# Patient Record
Sex: Female | Born: 1975 | Race: Black or African American | Hispanic: No | Smoking: Former smoker
Health system: Southern US, Community
[De-identification: ages and names within clinical notes are randomized; demographics above are authoritative.]

## PROBLEM LIST (undated history)

## (undated) ENCOUNTER — Inpatient Hospital Stay (HOSPITAL_COMMUNITY): Payer: Self-pay

## (undated) DIAGNOSIS — D649 Anemia, unspecified: Secondary | ICD-10-CM

## (undated) DIAGNOSIS — R7303 Prediabetes: Secondary | ICD-10-CM

## (undated) DIAGNOSIS — B1910 Unspecified viral hepatitis B without hepatic coma: Secondary | ICD-10-CM

## (undated) DIAGNOSIS — I10 Essential (primary) hypertension: Secondary | ICD-10-CM

## (undated) DIAGNOSIS — M7989 Other specified soft tissue disorders: Secondary | ICD-10-CM

## (undated) DIAGNOSIS — J45909 Unspecified asthma, uncomplicated: Secondary | ICD-10-CM

## (undated) DIAGNOSIS — L509 Urticaria, unspecified: Secondary | ICD-10-CM

## (undated) DIAGNOSIS — N979 Female infertility, unspecified: Secondary | ICD-10-CM

## (undated) DIAGNOSIS — T7840XA Allergy, unspecified, initial encounter: Secondary | ICD-10-CM

## (undated) DIAGNOSIS — E739 Lactose intolerance, unspecified: Secondary | ICD-10-CM

## (undated) DIAGNOSIS — R0602 Shortness of breath: Secondary | ICD-10-CM

## (undated) DIAGNOSIS — E669 Obesity, unspecified: Secondary | ICD-10-CM

## (undated) DIAGNOSIS — E7800 Pure hypercholesterolemia, unspecified: Secondary | ICD-10-CM

## (undated) DIAGNOSIS — J302 Other seasonal allergic rhinitis: Secondary | ICD-10-CM

## (undated) HISTORY — DX: Pure hypercholesterolemia, unspecified: E78.00

## (undated) HISTORY — DX: Prediabetes: R73.03

## (undated) HISTORY — DX: Female infertility, unspecified: N97.9

## (undated) HISTORY — DX: Other specified soft tissue disorders: M79.89

## (undated) HISTORY — PX: HIP SURGERY: SHX245

## (undated) HISTORY — DX: Urticaria, unspecified: L50.9

## (undated) HISTORY — DX: Unspecified asthma, uncomplicated: J45.909

## (undated) HISTORY — DX: Unspecified viral hepatitis B without hepatic coma: B19.10

## (undated) HISTORY — DX: Shortness of breath: R06.02

## (undated) HISTORY — DX: Lactose intolerance, unspecified: E73.9

## (undated) HISTORY — DX: Anemia, unspecified: D64.9

---

## 1998-06-10 ENCOUNTER — Emergency Department (HOSPITAL_COMMUNITY): Admission: EM | Admit: 1998-06-10 | Discharge: 1998-06-10 | Payer: Self-pay | Admitting: Emergency Medicine

## 1999-11-14 ENCOUNTER — Other Ambulatory Visit: Admission: RE | Admit: 1999-11-14 | Discharge: 1999-11-14 | Payer: Self-pay | Admitting: Obstetrics and Gynecology

## 2004-06-11 ENCOUNTER — Ambulatory Visit: Payer: Self-pay | Admitting: Family Medicine

## 2004-06-27 ENCOUNTER — Ambulatory Visit: Payer: Self-pay | Admitting: Family Medicine

## 2004-07-04 ENCOUNTER — Ambulatory Visit: Payer: Self-pay | Admitting: *Deleted

## 2005-05-06 ENCOUNTER — Ambulatory Visit: Payer: Self-pay | Admitting: Family Medicine

## 2005-06-24 ENCOUNTER — Ambulatory Visit: Payer: Self-pay | Admitting: Family Medicine

## 2005-07-08 ENCOUNTER — Ambulatory Visit: Payer: Self-pay | Admitting: Family Medicine

## 2005-08-26 ENCOUNTER — Ambulatory Visit: Payer: Self-pay | Admitting: Family Medicine

## 2006-02-06 ENCOUNTER — Ambulatory Visit: Payer: Self-pay | Admitting: *Deleted

## 2006-04-06 ENCOUNTER — Ambulatory Visit: Payer: Self-pay | Admitting: Family Medicine

## 2006-10-07 ENCOUNTER — Ambulatory Visit: Payer: Self-pay | Admitting: Family Medicine

## 2006-11-04 ENCOUNTER — Ambulatory Visit: Payer: Self-pay | Admitting: Family Medicine

## 2006-11-25 ENCOUNTER — Ambulatory Visit: Payer: Self-pay | Admitting: Internal Medicine

## 2006-11-25 ENCOUNTER — Encounter (INDEPENDENT_AMBULATORY_CARE_PROVIDER_SITE_OTHER): Payer: Self-pay | Admitting: Family Medicine

## 2007-03-13 ENCOUNTER — Emergency Department (HOSPITAL_COMMUNITY): Admission: EM | Admit: 2007-03-13 | Discharge: 2007-03-13 | Payer: Self-pay | Admitting: Emergency Medicine

## 2007-04-01 ENCOUNTER — Emergency Department (HOSPITAL_COMMUNITY): Admission: EM | Admit: 2007-04-01 | Discharge: 2007-04-01 | Payer: Self-pay | Admitting: Emergency Medicine

## 2007-04-28 ENCOUNTER — Encounter (INDEPENDENT_AMBULATORY_CARE_PROVIDER_SITE_OTHER): Payer: Self-pay | Admitting: Family Medicine

## 2007-04-28 DIAGNOSIS — D509 Iron deficiency anemia, unspecified: Secondary | ICD-10-CM

## 2007-04-28 DIAGNOSIS — B977 Papillomavirus as the cause of diseases classified elsewhere: Secondary | ICD-10-CM

## 2007-04-28 DIAGNOSIS — R87619 Unspecified abnormal cytological findings in specimens from cervix uteri: Secondary | ICD-10-CM

## 2007-05-12 ENCOUNTER — Encounter (INDEPENDENT_AMBULATORY_CARE_PROVIDER_SITE_OTHER): Payer: Self-pay | Admitting: *Deleted

## 2007-05-31 DIAGNOSIS — J309 Allergic rhinitis, unspecified: Secondary | ICD-10-CM | POA: Insufficient documentation

## 2007-08-31 ENCOUNTER — Ambulatory Visit: Payer: Self-pay | Admitting: Family Medicine

## 2007-08-31 LAB — CONVERTED CEMR LAB
ALT: 15 units/L (ref 0–35)
AST: 20 units/L (ref 0–37)
Albumin: 3.9 g/dL (ref 3.5–5.2)
Alkaline Phosphatase: 89 units/L (ref 39–117)
BUN: 12 mg/dL (ref 6–23)
Basophils Absolute: 0 10*3/uL (ref 0.0–0.1)
Basophils Relative: 1 % (ref 0–1)
CO2: 22 meq/L (ref 19–32)
Calcium: 9.3 mg/dL (ref 8.4–10.5)
Chloride: 108 meq/L (ref 96–112)
Cholesterol: 168 mg/dL (ref 0–200)
Creatinine, Ser: 0.85 mg/dL (ref 0.40–1.20)
Eosinophils Absolute: 0.2 10*3/uL (ref 0.0–0.7)
Eosinophils Relative: 4 % (ref 0–5)
Glucose, Bld: 87 mg/dL (ref 70–99)
HCT: 32.2 % — ABNORMAL LOW (ref 36.0–46.0)
HDL: 40 mg/dL (ref >39)
Hemoglobin: 8.8 g/dL — ABNORMAL LOW (ref 12.0–15.0)
LDL Cholesterol: 106 mg/dL — ABNORMAL HIGH (ref 0–99)
Lymphocytes Relative: 40 % (ref 12–46)
Lymphs Abs: 2.5 10*3/uL (ref 0.7–4.0)
MCHC: 27.3 g/dL — ABNORMAL LOW (ref 30.0–36.0)
MCV: 71.4 fL — ABNORMAL LOW (ref 78.0–100.0)
Monocytes Absolute: 0.7 10*3/uL (ref 0.1–1.0)
Monocytes Relative: 12 % (ref 3–12)
Neutro Abs: 2.6 10*3/uL (ref 1.7–7.7)
Neutrophils Relative %: 43 % (ref 43–77)
Platelets: 379 10*3/uL (ref 150–400)
Potassium: 4.2 meq/L (ref 3.5–5.3)
RBC: 4.51 M/uL (ref 3.87–5.11)
RDW: 17.5 % — ABNORMAL HIGH (ref 11.5–15.5)
Sodium: 142 meq/L (ref 135–145)
Total Bilirubin: 0.6 mg/dL (ref 0.3–1.2)
Total CHOL/HDL Ratio: 4.2
Total Protein: 7.4 g/dL (ref 6.0–8.3)
Triglycerides: 109 mg/dL (ref <150)
VLDL: 22 mg/dL (ref 0–40)
WBC: 6.1 10*3/uL (ref 4.0–10.5)

## 2008-04-04 ENCOUNTER — Ambulatory Visit: Payer: Self-pay | Admitting: Internal Medicine

## 2008-04-04 ENCOUNTER — Ambulatory Visit (HOSPITAL_COMMUNITY): Admission: RE | Admit: 2008-04-04 | Discharge: 2008-04-04 | Payer: Self-pay | Admitting: Family Medicine

## 2008-04-04 ENCOUNTER — Encounter (INDEPENDENT_AMBULATORY_CARE_PROVIDER_SITE_OTHER): Payer: Self-pay | Admitting: Family Medicine

## 2008-04-04 LAB — CONVERTED CEMR LAB
Basophils Absolute: 0 10*3/uL (ref 0.0–0.1)
Eosinophils Absolute: 0.2 10*3/uL (ref 0.0–0.7)
Eosinophils Relative: 5 % (ref 0–5)
Hemoglobin: 12.2 g/dL (ref 12.0–15.0)
Lymphocytes Relative: 47 % — ABNORMAL HIGH (ref 12–46)
MCHC: 31.4 g/dL (ref 30.0–36.0)
Monocytes Absolute: 0.3 10*3/uL (ref 0.1–1.0)
Monocytes Relative: 9 % (ref 3–12)
Neutrophils Relative %: 38 % — ABNORMAL LOW (ref 43–77)
RDW: 15.4 % (ref 11.5–15.5)
TSH: 1.268 microintl units/mL (ref 0.350–4.50)
WBC: 3.8 10*3/uL — ABNORMAL LOW (ref 4.0–10.5)

## 2009-01-12 ENCOUNTER — Ambulatory Visit: Payer: Self-pay | Admitting: Internal Medicine

## 2009-01-16 ENCOUNTER — Ambulatory Visit: Payer: Self-pay | Admitting: Internal Medicine

## 2009-02-13 ENCOUNTER — Ambulatory Visit: Payer: Self-pay | Admitting: Internal Medicine

## 2009-03-12 ENCOUNTER — Ambulatory Visit: Payer: Self-pay | Admitting: Internal Medicine

## 2009-03-26 ENCOUNTER — Encounter (INDEPENDENT_AMBULATORY_CARE_PROVIDER_SITE_OTHER): Payer: Self-pay | Admitting: Adult Health

## 2009-03-26 ENCOUNTER — Ambulatory Visit: Payer: Self-pay | Admitting: Internal Medicine

## 2009-05-07 ENCOUNTER — Encounter (INDEPENDENT_AMBULATORY_CARE_PROVIDER_SITE_OTHER): Payer: Self-pay | Admitting: Adult Health

## 2009-05-07 ENCOUNTER — Ambulatory Visit: Payer: Self-pay | Admitting: Internal Medicine

## 2009-05-07 LAB — CONVERTED CEMR LAB
ALT: 17 units/L (ref 0–35)
AST: 18 units/L (ref 0–37)
Albumin: 4 g/dL (ref 3.5–5.2)
Chlamydia, DNA Probe: NEGATIVE
Cholesterol: 136 mg/dL (ref 0–200)
Eosinophils Absolute: 0.2 10*3/uL (ref 0.0–0.7)
Eosinophils Relative: 6 % — ABNORMAL HIGH (ref 0–5)
Hemoglobin: 12.7 g/dL (ref 12.0–15.0)
LDL Cholesterol: 86 mg/dL (ref 0–99)
Lymphocytes Relative: 50 % — ABNORMAL HIGH (ref 12–46)
MCHC: 32.2 g/dL (ref 30.0–36.0)
MCV: 86.2 fL (ref 78.0–100.0)
Monocytes Absolute: 0.3 10*3/uL (ref 0.1–1.0)
Monocytes Relative: 9 % (ref 3–12)
Neutro Abs: 1 10*3/uL — ABNORMAL LOW (ref 1.7–7.7)
Potassium: 3.7 meq/L (ref 3.5–5.3)
Sodium: 141 meq/L (ref 135–145)
Total CHOL/HDL Ratio: 3.8

## 2009-05-23 ENCOUNTER — Ambulatory Visit: Payer: Self-pay | Admitting: Internal Medicine

## 2009-05-23 ENCOUNTER — Encounter (INDEPENDENT_AMBULATORY_CARE_PROVIDER_SITE_OTHER): Payer: Self-pay | Admitting: Adult Health

## 2009-05-23 LAB — CONVERTED CEMR LAB
HCT: 38.5 % (ref 36.0–46.0)
Lymphocytes Relative: 45 % (ref 12–46)
Lymphs Abs: 2.3 10*3/uL (ref 0.7–4.0)
MCHC: 31.7 g/dL (ref 30.0–36.0)
Monocytes Relative: 8 % (ref 3–12)
Neutro Abs: 2.2 10*3/uL (ref 1.7–7.7)
Neutrophils Relative %: 42 % — ABNORMAL LOW (ref 43–77)

## 2009-07-04 ENCOUNTER — Ambulatory Visit (HOSPITAL_COMMUNITY): Admission: RE | Admit: 2009-07-04 | Discharge: 2009-07-04 | Payer: Self-pay | Admitting: Obstetrics & Gynecology

## 2009-07-04 LAB — OB RESULTS CONSOLE VARICELLA ZOSTER ANTIBODY, IGG: Varicella: IMMUNE

## 2009-07-04 LAB — SICKLE CELL SCREEN: Sickle Cell Screen: NORMAL

## 2009-07-12 ENCOUNTER — Ambulatory Visit: Payer: Self-pay | Admitting: Obstetrics & Gynecology

## 2009-08-09 ENCOUNTER — Ambulatory Visit: Payer: Self-pay | Admitting: Obstetrics & Gynecology

## 2009-08-13 ENCOUNTER — Ambulatory Visit (HOSPITAL_COMMUNITY): Admission: RE | Admit: 2009-08-13 | Discharge: 2009-08-13 | Payer: Self-pay | Admitting: Family Medicine

## 2009-08-30 ENCOUNTER — Ambulatory Visit: Payer: Self-pay | Admitting: Obstetrics & Gynecology

## 2009-09-20 ENCOUNTER — Ambulatory Visit: Payer: Self-pay | Admitting: Obstetrics & Gynecology

## 2009-10-11 ENCOUNTER — Ambulatory Visit: Payer: Self-pay | Admitting: Obstetrics and Gynecology

## 2009-10-11 ENCOUNTER — Encounter: Payer: Self-pay | Admitting: Family Medicine

## 2009-10-11 LAB — CONVERTED CEMR LAB
BUN: 8 mg/dL (ref 6–23)
CO2: 16 meq/L — ABNORMAL LOW (ref 19–32)
Calcium: 9 mg/dL (ref 8.4–10.5)
Chloride: 103 meq/L (ref 96–112)
HCT: 35.6 % — ABNORMAL LOW (ref 36.0–46.0)
MCHC: 32.9 g/dL (ref 30.0–36.0)
Platelets: 246 10*3/uL (ref 150–400)
Potassium: 3.7 meq/L (ref 3.5–5.3)
RBC: 4.04 M/uL (ref 3.87–5.11)
RDW: 13.6 % (ref 11.5–15.5)
Sodium: 133 meq/L — ABNORMAL LOW (ref 135–145)
Total Bilirubin: 0.5 mg/dL (ref 0.3–1.2)

## 2009-10-12 ENCOUNTER — Ambulatory Visit: Payer: Self-pay | Admitting: Obstetrics & Gynecology

## 2009-10-12 ENCOUNTER — Encounter: Payer: Self-pay | Admitting: Family Medicine

## 2009-10-12 LAB — CONVERTED CEMR LAB: Collection Interval-CRCL: 24 hr

## 2009-10-15 ENCOUNTER — Ambulatory Visit (HOSPITAL_COMMUNITY): Admission: RE | Admit: 2009-10-15 | Discharge: 2009-10-15 | Payer: Self-pay | Admitting: Family Medicine

## 2009-10-25 ENCOUNTER — Ambulatory Visit: Payer: Self-pay | Admitting: Obstetrics & Gynecology

## 2009-11-08 ENCOUNTER — Ambulatory Visit: Payer: Self-pay | Admitting: Obstetrics & Gynecology

## 2009-11-22 ENCOUNTER — Ambulatory Visit: Payer: Self-pay | Admitting: Family Medicine

## 2009-11-23 ENCOUNTER — Ambulatory Visit (HOSPITAL_COMMUNITY): Admission: RE | Admit: 2009-11-23 | Discharge: 2009-11-23 | Payer: Self-pay | Admitting: Obstetrics & Gynecology

## 2009-11-26 ENCOUNTER — Ambulatory Visit: Payer: Self-pay | Admitting: Obstetrics & Gynecology

## 2009-11-29 ENCOUNTER — Ambulatory Visit: Payer: Self-pay | Admitting: Obstetrics & Gynecology

## 2009-12-03 ENCOUNTER — Ambulatory Visit: Payer: Self-pay | Admitting: Obstetrics and Gynecology

## 2009-12-06 ENCOUNTER — Ambulatory Visit: Payer: Self-pay | Admitting: Obstetrics & Gynecology

## 2009-12-10 ENCOUNTER — Ambulatory Visit (HOSPITAL_COMMUNITY): Admission: RE | Admit: 2009-12-10 | Discharge: 2009-12-10 | Payer: Self-pay | Admitting: Obstetrics and Gynecology

## 2009-12-10 ENCOUNTER — Ambulatory Visit: Payer: Self-pay | Admitting: Obstetrics & Gynecology

## 2009-12-13 ENCOUNTER — Ambulatory Visit: Payer: Self-pay | Admitting: Obstetrics & Gynecology

## 2009-12-14 ENCOUNTER — Encounter: Payer: Self-pay | Admitting: Obstetrics & Gynecology

## 2009-12-14 LAB — CONVERTED CEMR LAB: Chlamydia, DNA Probe: NEGATIVE

## 2009-12-17 ENCOUNTER — Ambulatory Visit (HOSPITAL_COMMUNITY): Admission: RE | Admit: 2009-12-17 | Discharge: 2009-12-17 | Payer: Self-pay | Admitting: Family Medicine

## 2009-12-17 ENCOUNTER — Ambulatory Visit: Payer: Self-pay | Admitting: Obstetrics and Gynecology

## 2009-12-20 ENCOUNTER — Ambulatory Visit: Payer: Self-pay | Admitting: Obstetrics & Gynecology

## 2009-12-20 ENCOUNTER — Ambulatory Visit (HOSPITAL_COMMUNITY): Admission: RE | Admit: 2009-12-20 | Discharge: 2009-12-20 | Payer: Self-pay | Admitting: Obstetrics & Gynecology

## 2009-12-24 ENCOUNTER — Ambulatory Visit: Payer: Self-pay | Admitting: Obstetrics & Gynecology

## 2009-12-24 ENCOUNTER — Ambulatory Visit (HOSPITAL_COMMUNITY): Admission: RE | Admit: 2009-12-24 | Discharge: 2009-12-24 | Payer: Self-pay | Admitting: Family Medicine

## 2009-12-27 ENCOUNTER — Ambulatory Visit: Payer: Self-pay | Admitting: Obstetrics and Gynecology

## 2009-12-31 ENCOUNTER — Ambulatory Visit: Payer: Self-pay | Admitting: Obstetrics & Gynecology

## 2009-12-31 ENCOUNTER — Ambulatory Visit (HOSPITAL_COMMUNITY): Admission: RE | Admit: 2009-12-31 | Discharge: 2009-12-31 | Payer: Self-pay | Admitting: Family Medicine

## 2010-01-03 ENCOUNTER — Ambulatory Visit: Payer: Self-pay | Admitting: Family Medicine

## 2010-01-04 ENCOUNTER — Inpatient Hospital Stay (HOSPITAL_COMMUNITY): Admission: AD | Admit: 2010-01-04 | Discharge: 2010-01-08 | Payer: Self-pay | Admitting: Family Medicine

## 2010-01-04 ENCOUNTER — Ambulatory Visit: Payer: Self-pay | Admitting: Obstetrics and Gynecology

## 2010-01-05 ENCOUNTER — Encounter: Payer: Self-pay | Admitting: Obstetrics & Gynecology

## 2010-01-15 ENCOUNTER — Emergency Department (HOSPITAL_COMMUNITY): Admission: EM | Admit: 2010-01-15 | Discharge: 2010-01-15 | Payer: Self-pay | Admitting: Emergency Medicine

## 2010-09-15 ENCOUNTER — Encounter: Payer: Self-pay | Admitting: *Deleted

## 2010-11-10 LAB — POCT URINALYSIS DIP (DEVICE)
Ketones, ur: NEGATIVE mg/dL
Protein, ur: NEGATIVE mg/dL
Specific Gravity, Urine: 1.02 (ref 1.005–1.030)
Urobilinogen, UA: 0.2 mg/dL (ref 0.0–1.0)
pH: 6 (ref 5.0–8.0)

## 2010-11-11 LAB — CBC
HCT: 27.3 % — ABNORMAL LOW (ref 36.0–46.0)
HCT: 28.7 % — ABNORMAL LOW (ref 36.0–46.0)
Hemoglobin: 9.4 g/dL — ABNORMAL LOW (ref 12.0–15.0)
Hemoglobin: 9.7 g/dL — ABNORMAL LOW (ref 12.0–15.0)
MCV: 89.9 fL (ref 78.0–100.0)
MCV: 89.9 fL (ref 78.0–100.0)
Platelets: 320 10*3/uL (ref 150–400)
RBC: 3.05 MIL/uL — ABNORMAL LOW (ref 3.87–5.11)
RBC: 3.07 MIL/uL — ABNORMAL LOW (ref 3.87–5.11)
RBC: 3.2 MIL/uL — ABNORMAL LOW (ref 3.87–5.11)
WBC: 12 10*3/uL — ABNORMAL HIGH (ref 4.0–10.5)
WBC: 8.7 10*3/uL (ref 4.0–10.5)

## 2010-11-11 LAB — DIFFERENTIAL
Eosinophils Relative: 3 % (ref 0–5)
Lymphocytes Relative: 18 % (ref 12–46)
Lymphs Abs: 1.6 10*3/uL (ref 0.7–4.0)
Monocytes Relative: 8 % (ref 3–12)

## 2010-11-11 LAB — HEMOGLOBIN AND HEMATOCRIT, BLOOD
HCT: 23 % — ABNORMAL LOW (ref 36.0–46.0)
Hemoglobin: 7.9 g/dL — ABNORMAL LOW (ref 12.0–15.0)

## 2010-11-11 LAB — BASIC METABOLIC PANEL
BUN: 6 mg/dL (ref 6–23)
BUN: 11 mg/dL (ref 6–23)
Calcium: 7.8 mg/dL — ABNORMAL LOW (ref 8.4–10.5)
Chloride: 112 mEq/L (ref 96–112)
GFR calc Af Amer: >60 mL/min (ref >60)
GFR calc Af Amer: >60 mL/min (ref >60)
GFR calc non Af Amer: >60 mL/min (ref >60)
Glucose, Bld: 79 mg/dL (ref 70–99)
Potassium: 3.6 mEq/L (ref 3.5–5.1)
Sodium: 132 mEq/L — ABNORMAL LOW (ref 135–145)
Sodium: 141 mEq/L (ref 135–145)

## 2010-11-11 LAB — POCT URINALYSIS DIP (DEVICE)
Hgb urine dipstick: NEGATIVE
Ketones, ur: NEGATIVE mg/dL
pH: 6 (ref 5.0–8.0)

## 2010-11-11 LAB — URINALYSIS, ROUTINE W REFLEX MICROSCOPIC
Glucose, UA: NEGATIVE mg/dL
Ketones, ur: NEGATIVE mg/dL
pH: 5.5 (ref 5.0–8.0)

## 2010-11-11 LAB — URINE MICROSCOPIC-ADD ON

## 2010-11-12 LAB — COMPREHENSIVE METABOLIC PANEL
ALT: 24 U/L (ref 0–35)
AST: 31 U/L (ref 0–37)
CO2: 21 mEq/L (ref 19–32)
Calcium: 9.4 mg/dL (ref 8.4–10.5)
Chloride: 109 mEq/L (ref 96–112)
GFR calc Af Amer: >60 mL/min (ref >60)
GFR calc non Af Amer: >60 mL/min (ref >60)
Potassium: 4.7 mEq/L (ref 3.5–5.1)
Sodium: 136 mEq/L (ref 135–145)
Total Bilirubin: 0.9 mg/dL (ref 0.3–1.2)

## 2010-11-12 LAB — CBC
HCT: 29.3 % — ABNORMAL LOW (ref 36.0–46.0)
HCT: 32.9 % — ABNORMAL LOW (ref 36.0–46.0)
Hemoglobin: 11.3 g/dL — ABNORMAL LOW (ref 12.0–15.0)
MCV: 89.4 fL (ref 78.0–100.0)
MCV: 91.2 fL (ref 78.0–100.0)
Platelets: 188 10*3/uL (ref 150–400)
Platelets: 189 10*3/uL (ref 150–400)
RBC: 4.19 MIL/uL (ref 3.87–5.11)
WBC: 14.2 10*3/uL — ABNORMAL HIGH (ref 4.0–10.5)
WBC: 7.4 10*3/uL (ref 4.0–10.5)
WBC: 9.1 10*3/uL (ref 4.0–10.5)

## 2010-11-12 LAB — POCT URINALYSIS DIP (DEVICE)
Glucose, UA: NEGATIVE mg/dL
Glucose, UA: NEGATIVE mg/dL
Glucose, UA: NEGATIVE mg/dL
Hgb urine dipstick: NEGATIVE
Ketones, ur: NEGATIVE mg/dL
Nitrite: NEGATIVE
Nitrite: NEGATIVE
Protein, ur: NEGATIVE mg/dL
Protein, ur: NEGATIVE mg/dL
Protein, ur: NEGATIVE mg/dL
Specific Gravity, Urine: 1.01 (ref 1.005–1.030)
Specific Gravity, Urine: 1.025 (ref 1.005–1.030)
Urobilinogen, UA: 0.2 mg/dL (ref 0.0–1.0)
Urobilinogen, UA: 0.2 mg/dL (ref 0.0–1.0)
Urobilinogen, UA: 0.2 mg/dL (ref 0.0–1.0)
Urobilinogen, UA: 0.2 mg/dL (ref 0.0–1.0)
pH: 6 (ref 5.0–8.0)
pH: 6 (ref 5.0–8.0)

## 2010-11-12 LAB — CROSSMATCH

## 2010-11-12 LAB — BASIC METABOLIC PANEL
BUN: 7 mg/dL (ref 6–23)
Chloride: 106 mEq/L (ref 96–112)
Glucose, Bld: 98 mg/dL (ref 70–99)
Potassium: 4.6 mEq/L (ref 3.5–5.1)
Sodium: 134 mEq/L — ABNORMAL LOW (ref 135–145)

## 2010-11-12 LAB — URINALYSIS, DIPSTICK ONLY
Bilirubin Urine: NEGATIVE
Hgb urine dipstick: NEGATIVE
Specific Gravity, Urine: 1.02 (ref 1.005–1.030)
Urobilinogen, UA: 0.2 mg/dL (ref 0.0–1.0)

## 2010-11-12 LAB — MRSA PCR SCREENING: MRSA by PCR: NEGATIVE

## 2010-11-13 LAB — POCT URINALYSIS DIP (DEVICE)
Bilirubin Urine: NEGATIVE
Hgb urine dipstick: NEGATIVE
Nitrite: NEGATIVE
Nitrite: NEGATIVE
Protein, ur: NEGATIVE mg/dL
Protein, ur: NEGATIVE mg/dL
Urobilinogen, UA: 0.2 mg/dL (ref 0.0–1.0)
Urobilinogen, UA: 0.2 mg/dL (ref 0.0–1.0)
pH: 6 (ref 5.0–8.0)
pH: 6 (ref 5.0–8.0)

## 2010-11-14 LAB — POCT URINALYSIS DIP (DEVICE)
Glucose, UA: NEGATIVE mg/dL
Nitrite: NEGATIVE
Protein, ur: NEGATIVE mg/dL
Specific Gravity, Urine: 1.01 (ref 1.005–1.030)
Urobilinogen, UA: 0.2 mg/dL (ref 0.0–1.0)

## 2010-11-18 LAB — POCT URINALYSIS DIP (DEVICE)
Bilirubin Urine: NEGATIVE
Bilirubin Urine: NEGATIVE
Glucose, UA: NEGATIVE mg/dL
Glucose, UA: NEGATIVE mg/dL
Hgb urine dipstick: NEGATIVE
Nitrite: NEGATIVE
Nitrite: NEGATIVE
Protein, ur: NEGATIVE mg/dL
Protein, ur: NEGATIVE mg/dL
Urobilinogen, UA: 0.2 mg/dL (ref 0.0–1.0)
Urobilinogen, UA: 0.2 mg/dL (ref 0.0–1.0)
pH: 6 (ref 5.0–8.0)

## 2010-11-25 LAB — POCT URINALYSIS DIP (DEVICE)
Bilirubin Urine: NEGATIVE
Glucose, UA: NEGATIVE mg/dL
Nitrite: NEGATIVE
Urobilinogen, UA: 0.2 mg/dL (ref 0.0–1.0)

## 2010-11-27 LAB — POCT URINALYSIS DIP (DEVICE)
Bilirubin Urine: NEGATIVE
Glucose, UA: NEGATIVE mg/dL
Ketones, ur: NEGATIVE mg/dL
Nitrite: NEGATIVE

## 2011-05-03 ENCOUNTER — Inpatient Hospital Stay (INDEPENDENT_AMBULATORY_CARE_PROVIDER_SITE_OTHER)
Admission: RE | Admit: 2011-05-03 | Discharge: 2011-05-03 | Disposition: A | Discharge status: Home / Self Care | Payer: Medicaid Other | Source: Ambulatory Visit | Attending: Family Medicine | Admitting: Family Medicine

## 2011-05-03 DIAGNOSIS — N390 Urinary tract infection, site not specified: Secondary | ICD-10-CM

## 2011-05-03 LAB — POCT URINALYSIS DIP (DEVICE)
Bilirubin Urine: NEGATIVE
Glucose, UA: NEGATIVE mg/dL
Hgb urine dipstick: NEGATIVE
Nitrite: NEGATIVE
Urobilinogen, UA: 0.2 mg/dL (ref 0.0–1.0)
pH: 6 (ref 5.0–8.0)

## 2011-05-06 LAB — URINE CULTURE

## 2011-08-22 IMAGING — US US FETAL BPP W/O NONSTRESS
1 series · 14 of 23 positions shown · non-contrast
Comparison: none

OBSTETRICAL ULTRASOUND:
 This ultrasound exam was performed in the [HOSPITAL] Ultrasound Department.  The OB US report was generated in the AS system, and faxed to the ordering physician.  This report is also available in [HOSPITAL]?s Access[employer]ware and in [REDACTED] PACS.

[Series 1: us fetal bpp w/o nonstress · non-contrast · 0.33mm/px · 23 acquisitions, 14 frames shown]
[im 1/23]
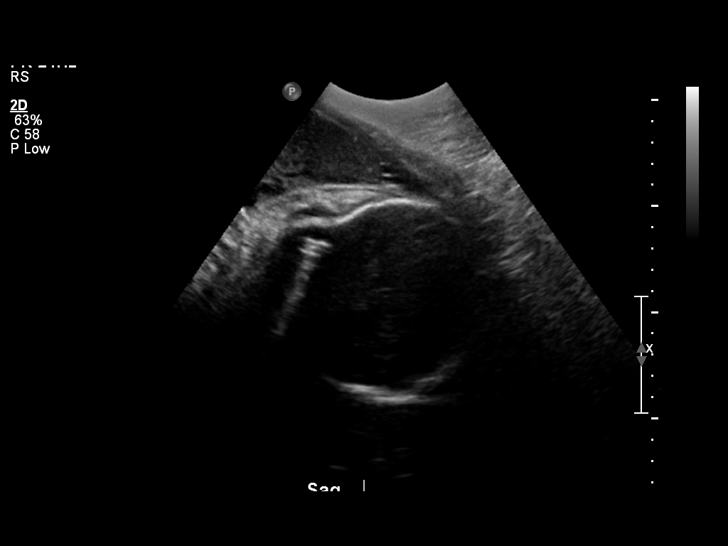
[im 3/23]
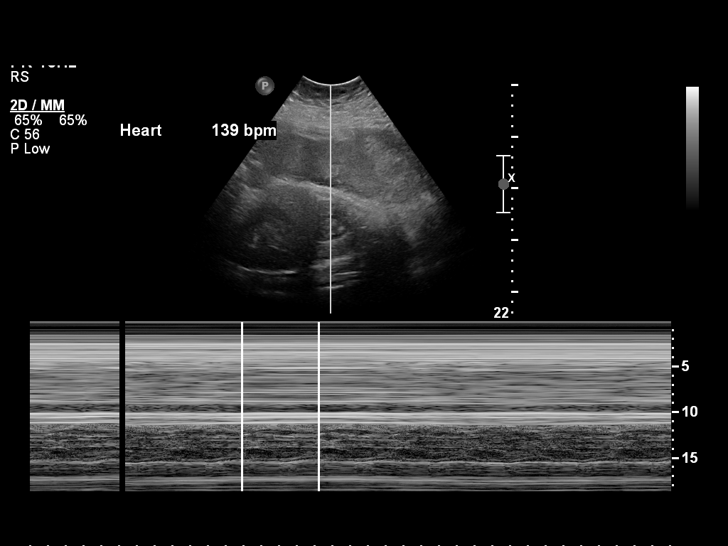
[im 5/23]
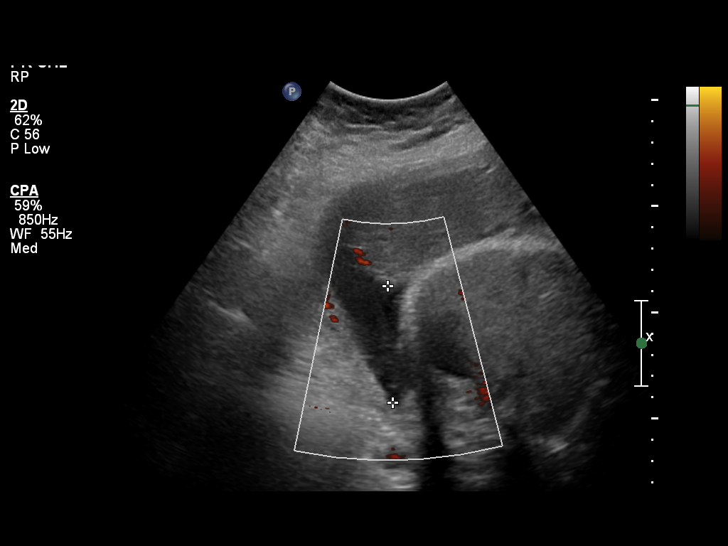
[im 6/23]
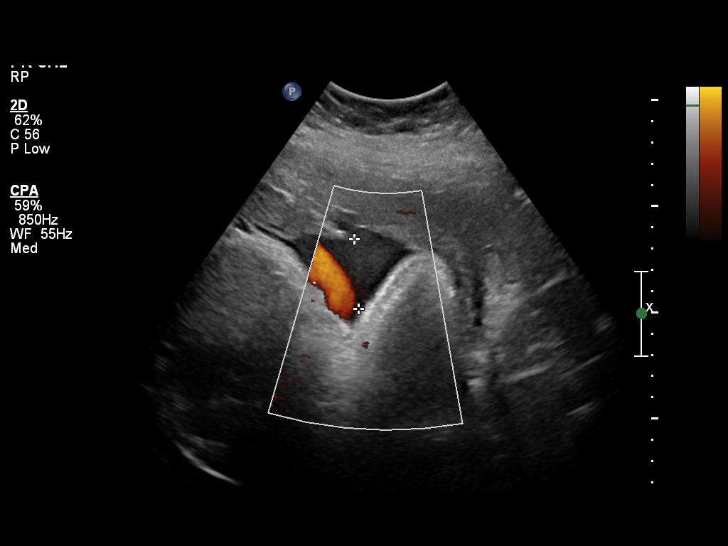
[im 8/23]
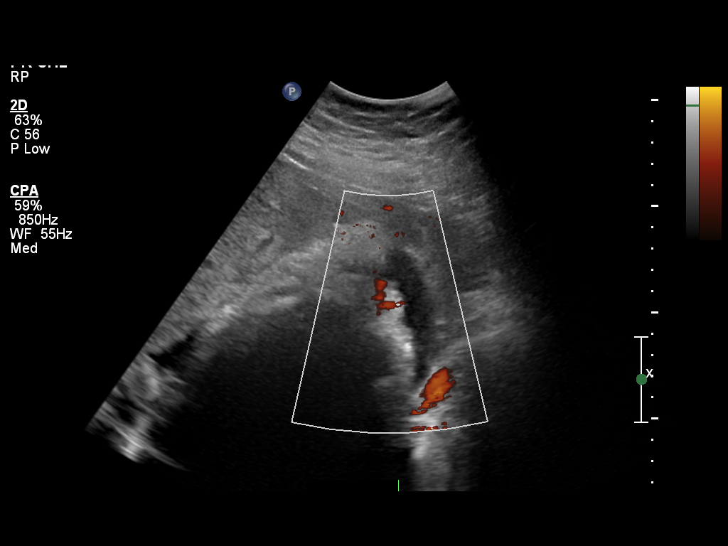
[im 10/23]
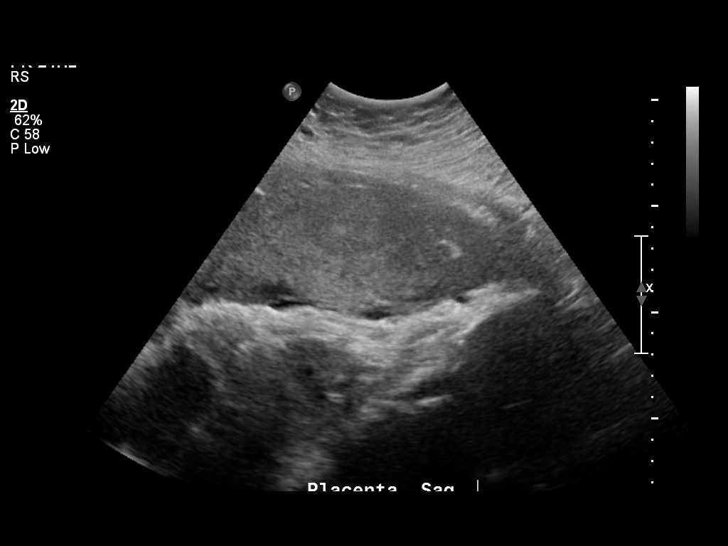
[im 11/23]
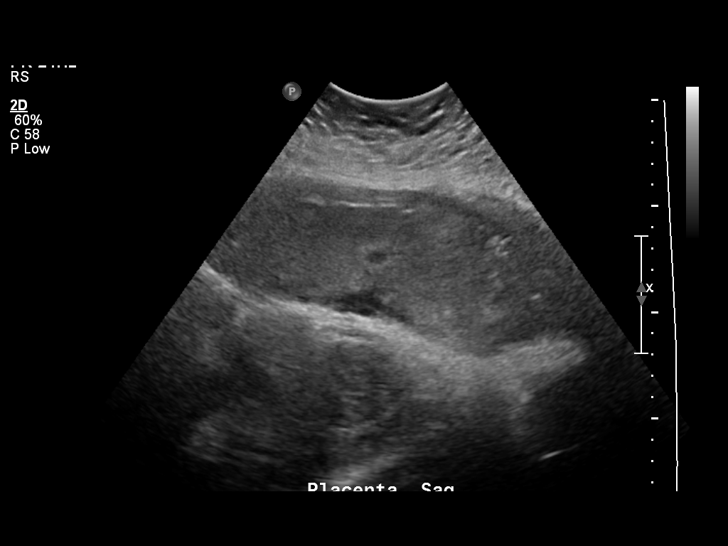
[im 13/23]
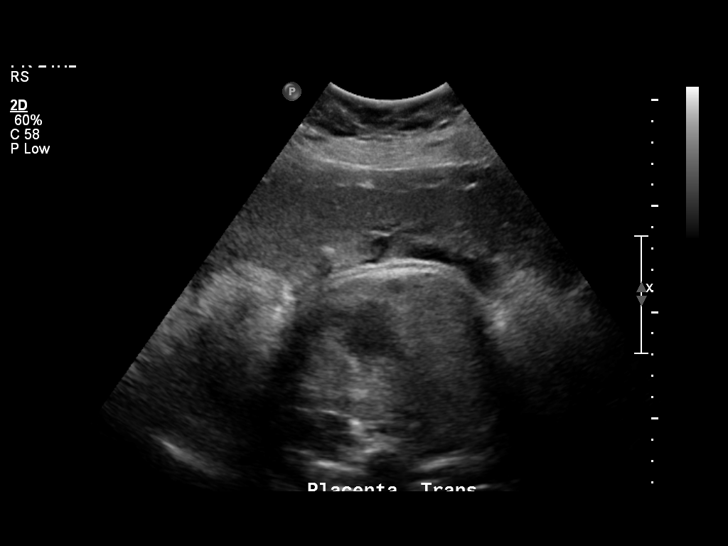
[im 14/23]
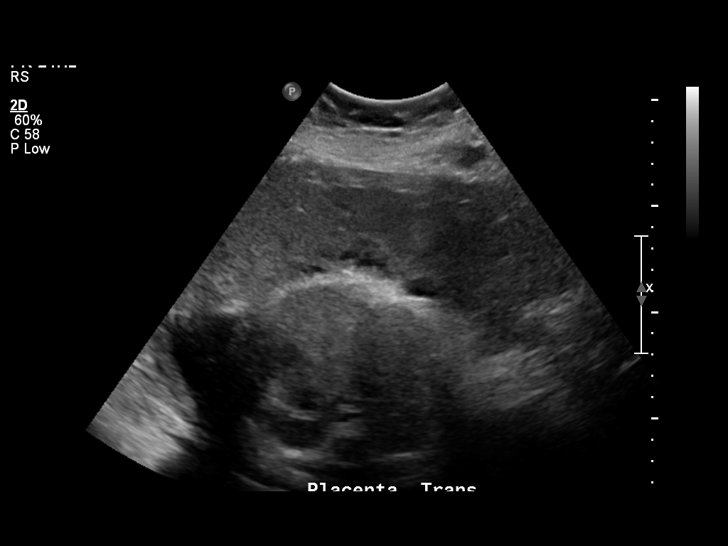
[im 16/23]
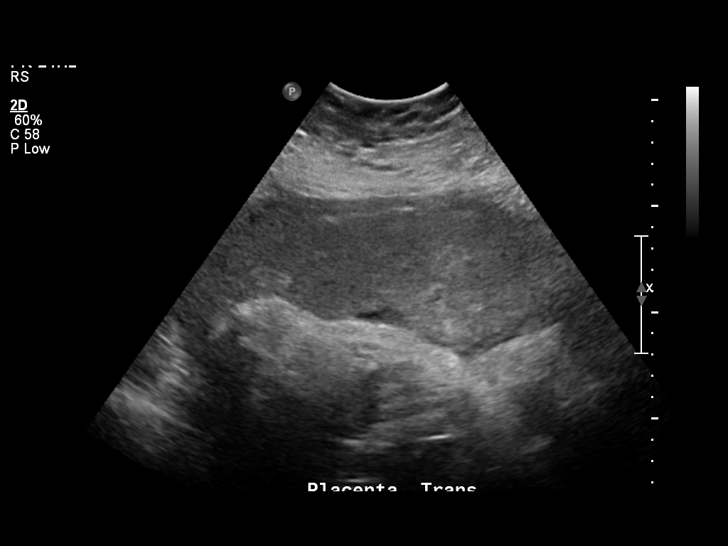
[im 18/23]
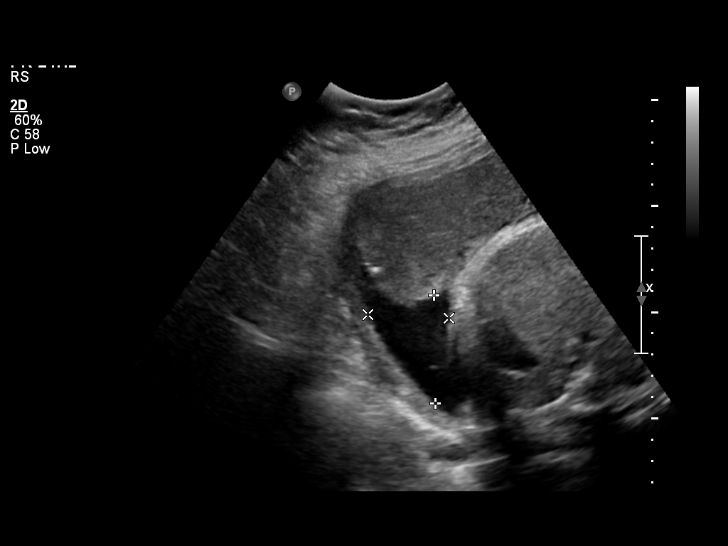
[im 19/23]
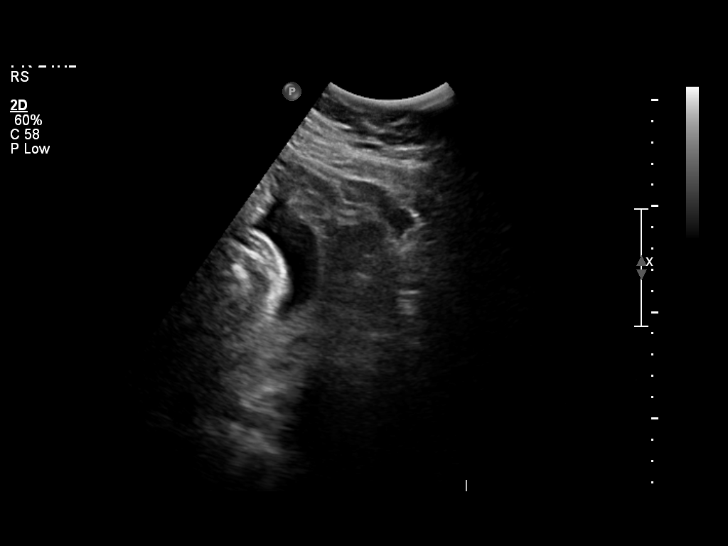
[im 21/23]
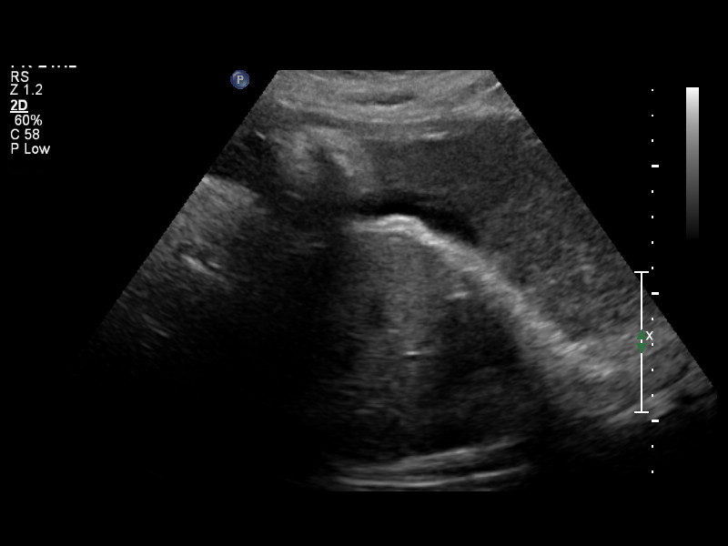
[im 23/23]
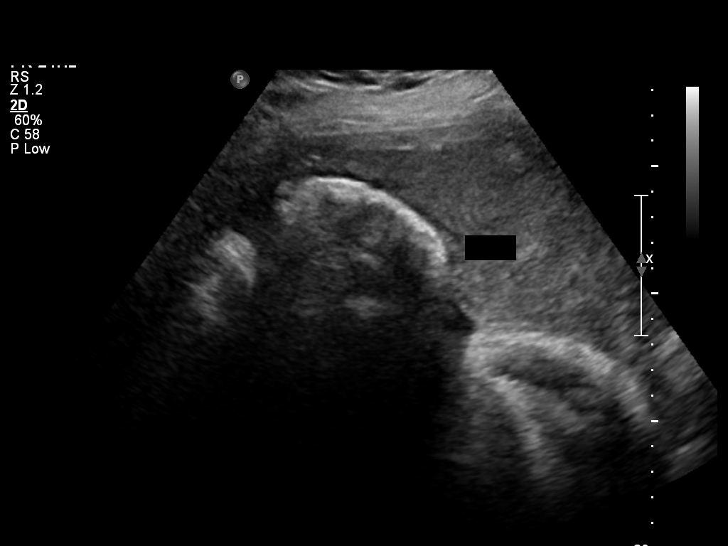

[14 of 23 positions shown; findings below may reference images not displayed]

IMPRESSION: See [facility] Obstetric US report.

## 2012-02-12 ENCOUNTER — Encounter (HOSPITAL_COMMUNITY): Payer: Self-pay | Admitting: *Deleted

## 2012-02-12 ENCOUNTER — Emergency Department (INDEPENDENT_AMBULATORY_CARE_PROVIDER_SITE_OTHER)
Admission: EM | Admit: 2012-02-12 | Discharge: 2012-02-12 | Disposition: A | Discharge status: Home / Self Care | Payer: Self-pay | Source: Home / Self Care | Attending: Emergency Medicine | Admitting: Emergency Medicine

## 2012-02-12 DIAGNOSIS — J069 Acute upper respiratory infection, unspecified: Secondary | ICD-10-CM

## 2012-02-12 HISTORY — DX: Other seasonal allergic rhinitis: J30.2

## 2012-02-12 HISTORY — DX: Obesity, unspecified: E66.9

## 2012-02-12 HISTORY — DX: Essential (primary) hypertension: I10

## 2012-02-12 LAB — POCT RAPID STREP A: Streptococcus, Group A Screen (Direct): NEGATIVE

## 2012-02-12 MED ORDER — GUAIFENESIN ER 600 MG PO TB12: 1200.0000 mg | ORAL_TABLET | Freq: Two times a day (BID) | ORAL | Status: AC

## 2012-02-12 MED ORDER — FLUTICASONE PROPIONATE 50 MCG/ACT NA SUSP: 2.0000 | Freq: Every day | NASAL | Status: DC

## 2012-02-12 NOTE — ED Notes (Signed)
Pt is here with complaints of sore throat and bilat ear pressure since 6/17.  Pt also reports dry cough with onset today.

## 2012-02-12 NOTE — Discharge Instructions (Signed)
Take the medication as written. Return if you get worse, have a fever >100.4, or for any concerns. You may take 600 mg of motrin with 1 gram of tylenol up to 4 times a day as needed for pain. This is an effective combination for pain.  Most sinus infections are viral and do not need antibiotics unless you have a high fever, have had this for 10 day, or you get better and then get sick again. Use a neti pot or the NeilMed sinus rinse as often as you want to to reduce nasal congestion. Follow the directions on the box.   Go to www.goodrx.com to look up your medications. This will give you a list of where you can find your prescriptions at the most affordable prices.   

## 2012-02-12 NOTE — ED Provider Notes (Signed)
History     CSN: [id number]  Arrival date & time 02/12/12  2130   First MD Initiated Contact with Patient 02/12/12 1843      Chief Complaint  Patient presents with  . Sore Throat  . Otalgia    (Consider location/radiation/quality/duration/timing/severity/associated sxs/prior treatment) HPI Comments: Patient with nasal congestion, postnasal drip, sore, irritated throat starting 3 days ago. Reports bilateral ear "fullness". No ear pain, otorrhea. States that she is able to yawn and "pop" her ears, which improves the sensation of ear fullness. Reports a nonproductive cough. No nausea, vomiting, fevers, chest pain, wheezing, shortness of breath, abdominal pain. Her daughter has a URI.  ROS as noted in HPI. All other ROS negative.   Patient is a 36 y.o. female presenting with pharyngitis and ear pain. The history is provided by the patient. No language interpreter was used.  Sore Throat This is a new problem. The current episode started more than 2 days ago. The problem occurs constantly. The problem has been gradually worsening. The symptoms are aggravated by swallowing. Nothing relieves the symptoms. The treatment provided no relief.  Otalgia    Past Medical History  Diagnosis Date  . Hypertension   . Seasonal allergies   . Obesity     Past Surgical History  Procedure Date  . Hip surgery     History reviewed. No pertinent family history.  History  Substance Use Topics  . Smoking status: Never Smoker   . Smokeless tobacco: Not on file  . Alcohol Use: No    OB History    Grav Para Term Preterm Abortions TAB SAB Ect Mult Living                  Review of Systems  HENT: Positive for ear pain.     Allergies  Review of patient's allergies indicates no known allergies.  Home Medications   Current Outpatient Rx  Name Route Sig Dispense Refill  . HYDROCHLOROTHIAZIDE 12.5 MG PO CAPS Oral Take 12.5 mg by mouth daily.    [name] FLUTICASONE PROPIONATE 50 MCG/ACT NA SUSP  Nasal Place 2 sprays into the nose daily. 16 g 0  . GUAIFENESIN ER 600 MG PO TB12 Oral Take 2 tablets (1,200 mg total) by mouth 2 (two) times daily. 40 tablet 0    BP 140/76  Pulse 94  Temp 99.4 F (37.4 C) (Oral)  Resp 20  SpO2 98%  LMP 02/12/2012  Physical Exam  Nursing note and vitals reviewed. Constitutional: She is oriented to person, place, and time. She appears well-developed and well-nourished.  HENT:  Head: Normocephalic and atraumatic.  Right Ear: Tympanic membrane and ear canal normal.  Left Ear: Ear canal normal. Tympanic membrane is retracted.  Nose: Mucosal edema and rhinorrhea present. No epistaxis.  Mouth/Throat: Uvula is midline and mucous membranes are normal. Posterior oropharyngeal erythema present. No oropharyngeal exudate.       Bilateral congestion left more than right. Near occlusion left nare. (-) frontal, maxillary sinus tenderness  Eyes: Conjunctivae and EOM are normal. Pupils are equal, round, and reactive to light.  Neck: Normal range of motion. Neck supple.  Cardiovascular: Normal rate, regular rhythm and normal heart sounds.   Pulmonary/Chest: Effort normal and breath sounds normal.  Abdominal: Bowel sounds are normal. She exhibits no distension.  Musculoskeletal: Normal range of motion.  Lymphadenopathy:    She has no cervical adenopathy.  Neurological: She is alert and oriented to person, place, and time.  Skin: Skin is warm and  dry. No rash noted.  Psychiatric: She has a normal mood and affect. Her behavior is normal. Judgment and thought content normal.    ED Course  Procedures (including critical care time)   Labs Reviewed  POCT RAPID STREP A ([facility] CARE ONLY)   No results found.   1. URI (upper respiratory infection)     Results for orders placed during the hospital encounter of 02/12/12  POCT RAPID STREP A ([facility] ONLY)      Component Value Range   Streptococcus, Group A Screen (Direct) NEGATIVE  NEGATIVE     MDM    Flonase, saline nasal irrigation, decongestions. No indications for antibiotics at this time.  Luiz Blare, MD 02/12/12 2024

## 2013-08-25 NOTE — L&D Delivery Note (Signed)
Delivery Note Pt became complete and after a brief time of pushing at 1:50 PM a viable female was delivered via VBAC, Spontaneous (Presentation: ; Occiput Anterior).  APGAR: 8, 9; weight: pending.  Cord clamped and cut by FOB. Hospital cord blood sample collected.  Placenta status: Intact, Spontaneous.  Cord: 3 vessels with the following complications: None.    Anesthesia: None  Episiotomy: None Lacerations: None Est. Blood Loss (mL): 250  Mom to postpartum.  Baby to Couplet care / Skin to Skin.  Stefano Gaul PA-S present and assisted.  Serita Grammes CNM 05/05/2014, 2:53 PM

## 2013-11-21 ENCOUNTER — Other Ambulatory Visit (HOSPITAL_COMMUNITY): Payer: Self-pay | Admitting: Nurse Practitioner

## 2013-11-21 DIAGNOSIS — Z3682 Encounter for antenatal screening for nuchal translucency: Secondary | ICD-10-CM

## 2013-11-21 LAB — CYTOLOGY - PAP: Pap Smear: NEGATIVE

## 2013-11-21 LAB — OB RESULTS CONSOLE RPR: RPR: NONREACTIVE

## 2013-11-21 LAB — OB RESULTS CONSOLE ABO/RH: RH TYPE: POSITIVE

## 2013-11-21 LAB — CYSTIC FIBROSIS DIAGNOSTIC STUDY: Interpretation-CFDNA:: NEGATIVE

## 2013-11-21 LAB — GLUCOSE TOLERANCE, 1 HOUR (50G) W/O FASTING: Glucose Tolerance, 1 hour: 142

## 2013-11-21 LAB — OB RESULTS CONSOLE GC/CHLAMYDIA
Chlamydia: NEGATIVE
Gonorrhea: NEGATIVE

## 2013-11-21 LAB — OB RESULTS CONSOLE HGB/HCT, BLOOD
HEMATOCRIT: 33 %
HEMOGLOBIN: 10.4 g/dL

## 2013-11-21 LAB — OB RESULTS CONSOLE RUBELLA ANTIBODY, IGM: RUBELLA: IMMUNE

## 2013-11-21 LAB — OB RESULTS CONSOLE PLATELET COUNT: Platelets: 306 10*3/uL

## 2013-11-22 ENCOUNTER — Encounter (HOSPITAL_COMMUNITY): Payer: Self-pay | Admitting: Nurse Practitioner

## 2013-11-22 LAB — OB RESULTS CONSOLE HEPATITIS B SURFACE ANTIGEN: Hepatitis B Surface Ag: POSITIVE

## 2013-11-23 LAB — GLUCOSE, 3 HOUR GESTATIONAL

## 2013-11-30 ENCOUNTER — Ambulatory Visit (HOSPITAL_COMMUNITY)
Admission: RE | Admit: 2013-11-30 | Discharge: 2013-11-30 | Disposition: A | Discharge status: Home / Self Care | Payer: Medicaid Other | Source: Ambulatory Visit | Attending: Nurse Practitioner | Admitting: Nurse Practitioner

## 2013-11-30 ENCOUNTER — Other Ambulatory Visit (HOSPITAL_COMMUNITY): Payer: Self-pay | Admitting: Nurse Practitioner

## 2013-11-30 ENCOUNTER — Other Ambulatory Visit: Payer: Self-pay

## 2013-11-30 ENCOUNTER — Encounter (HOSPITAL_COMMUNITY): Payer: Self-pay

## 2013-11-30 ENCOUNTER — Encounter (HOSPITAL_COMMUNITY): Payer: Self-pay | Admitting: Nurse Practitioner

## 2013-11-30 DIAGNOSIS — Z3682 Encounter for antenatal screening for nuchal translucency: Secondary | ICD-10-CM

## 2013-11-30 DIAGNOSIS — E669 Obesity, unspecified: Secondary | ICD-10-CM | POA: Insufficient documentation

## 2013-11-30 DIAGNOSIS — Z23 Encounter for immunization: Secondary | ICD-10-CM

## 2013-11-30 DIAGNOSIS — O10919 Unspecified pre-existing hypertension complicating pregnancy, unspecified trimester: Secondary | ICD-10-CM

## 2013-11-30 DIAGNOSIS — O09529 Supervision of elderly multigravida, unspecified trimester: Secondary | ICD-10-CM

## 2013-11-30 DIAGNOSIS — O9989 Other specified diseases and conditions complicating pregnancy, childbirth and the puerperium: Secondary | ICD-10-CM | POA: Insufficient documentation

## 2013-11-30 DIAGNOSIS — O10019 Pre-existing essential hypertension complicating pregnancy, unspecified trimester: Secondary | ICD-10-CM | POA: Insufficient documentation

## 2013-11-30 DIAGNOSIS — O34219 Maternal care for unspecified type scar from previous cesarean delivery: Secondary | ICD-10-CM | POA: Insufficient documentation

## 2013-11-30 DIAGNOSIS — Z98891 History of uterine scar from previous surgery: Secondary | ICD-10-CM

## 2013-11-30 DIAGNOSIS — Z3689 Encounter for other specified antenatal screening: Secondary | ICD-10-CM | POA: Insufficient documentation

## 2013-11-30 DIAGNOSIS — O99210 Obesity complicating pregnancy, unspecified trimester: Secondary | ICD-10-CM

## 2013-11-30 NOTE — ED Notes (Signed)
Appointment Date:  DOB: 06-05-1976 Referring Provider: Jolaine Click, NP Attending: Dr. Benjaman Lobe  Ms. Ann Woods was seen for genetic counseling because of a maternal age of 38 y.o..     She was  counseled regarding maternal age and the association with risk for chromosome conditions due to nondisjunction with aging of the ova.   We reviewed chromosomes, nondisjunction, and the associated 1 in 67 risk for fetal aneuploidy related to a maternal age of 38 y.o. at [redacted]w[redacted]d gestation.  She was counseled that the risk for aneuploidy decreases as gestational age increases, accounting for those pregnancies which spontaneously abort.  We specifically discussed Down syndrome (trisomy 18), trisomies 57 and 34, and sex chromosome aneuploidies (47,XXX and 47,XXY) including the common features and prognoses of each.   We reviewed available screening options including Quad screen, noninvasive prenatal screening (NIPS)/cell free DNA (cfDNA) testing, and detailed ultrasound.  She was counseled that screening tests are used to modify a patient's a priori risk for aneuploidy, typically based on age. This estimate provides a pregnancy specific risk assessment. We reviewed the benefits and limitations of each option. Specifically, we discussed the conditions for which each test screens, the detection rates, and false positive rates of each. She was also counseled regarding diagnostic testing via [facility] and amniocentesis. We reviewed the approximate 1 in 517-616 risk for complications from amniocentesis, including spontaneous pregnancy loss. After consideration of all the options, she elected to proceed with NIPS.  Those results will be available in 10-14 days.  She was counseled regarding the expense of NIPS, and that approximately $3000 is billed to the insurance provider.  The amount she will be responsible for, out of pocket, depends upon the specific plan and may be subject to co-pay, co-insurance and/or  deductible.    Ms. Courser was provided with written information regarding sickle cell anemia (SCA) including the carrier frequency and incidence in the African-American population, the availability of carrier testing and prenatal diagnosis if indicated.  In addition, we discussed that hemoglobinopathies are routinely screened for as part of the [place] newborn screening panel.  She declined hemoglobin electrophoresis today, stating this has been performed previously and was reported to her to be normal.  Both family histories were reviewed and found to be noncontributory for birth defects, intellectual disability, and known genetic conditions. Without further information regarding the provided family history, an accurate genetic risk cannot be calculated. Further genetic counseling is warranted if more information is obtained.  Ms. Marcelli denied exposure to environmental toxins or chemical agents. She denied the use of alcohol, tobacco or street drugs. She denied significant viral illnesses during the course of her pregnancy. Her medical and surgical histories were noncontributory.   I counseled Ms. Yurko regarding the above risks and available options.  The approximate face-to-face time with the genetic counselor was 40 minutes. Most of the counseling was provided by Jacelyn Grip, [employer] genetic counseling student, under my direct supervision.   Cam Hai, MS,  Certified Genetic Counselor

## 2013-12-01 ENCOUNTER — Other Ambulatory Visit (HOSPITAL_COMMUNITY): Payer: Self-pay | Admitting: Maternal and Fetal Medicine

## 2013-12-01 ENCOUNTER — Ambulatory Visit (INDEPENDENT_AMBULATORY_CARE_PROVIDER_SITE_OTHER): Payer: Medicaid Other | Admitting: Family

## 2013-12-01 ENCOUNTER — Encounter: Payer: Self-pay | Admitting: Family

## 2013-12-01 VITALS — BP 120/76 | Temp 98.2°F | Ht 66.0 in | Wt 343.9 lb

## 2013-12-01 DIAGNOSIS — O10019 Pre-existing essential hypertension complicating pregnancy, unspecified trimester: Secondary | ICD-10-CM

## 2013-12-01 DIAGNOSIS — O10919 Unspecified pre-existing hypertension complicating pregnancy, unspecified trimester: Secondary | ICD-10-CM

## 2013-12-01 DIAGNOSIS — O99210 Obesity complicating pregnancy, unspecified trimester: Secondary | ICD-10-CM

## 2013-12-01 DIAGNOSIS — B181 Chronic viral hepatitis B without delta-agent: Secondary | ICD-10-CM | POA: Insufficient documentation

## 2013-12-01 DIAGNOSIS — E669 Obesity, unspecified: Secondary | ICD-10-CM

## 2013-12-01 DIAGNOSIS — O09529 Supervision of elderly multigravida, unspecified trimester: Secondary | ICD-10-CM

## 2013-12-01 DIAGNOSIS — O3421 Maternal care for scar from previous cesarean delivery: Secondary | ICD-10-CM

## 2013-12-01 DIAGNOSIS — B1910 Unspecified viral hepatitis B without hepatic coma: Secondary | ICD-10-CM

## 2013-12-01 DIAGNOSIS — O0990 Supervision of high risk pregnancy, unspecified, unspecified trimester: Secondary | ICD-10-CM | POA: Insufficient documentation

## 2013-12-01 LAB — POCT URINALYSIS DIP (DEVICE)
BILIRUBIN URINE: NEGATIVE
GLUCOSE, UA: NEGATIVE mg/dL
Hgb urine dipstick: NEGATIVE
KETONES UR: NEGATIVE mg/dL
Leukocytes, UA: NEGATIVE
Nitrite: NEGATIVE
Protein, ur: NEGATIVE mg/dL
SPECIFIC GRAVITY, URINE: 1.025 (ref 1.005–1.030)
UROBILINOGEN UA: 0.2 mg/dL (ref 0.0–1.0)
pH: 6 (ref 5.0–8.0)

## 2013-12-01 NOTE — Addendum Note (Signed)
Addended by: Langston Reusing on: 12/01/2013 10:29 AM   Modules accepted: Orders

## 2013-12-01 NOTE — Progress Notes (Signed)
P= 81 C/o of intermittent mild right lower abdominal pain and states "but when I lay down it goes away."  Discussed appropriate weight gain (11-20lb); pt. Verbalized understanding.  Transfer from [facility]; all new OB labs done. Flu shot given there.  New OB packet given.

## 2013-12-01 NOTE — Progress Notes (Signed)
   Subjective:    LEEAN Woods is a [id number] [redacted]w[redacted]d being seen today for her first [facility]  Full exam and labs were collected at her initial visit there.  Her obstetrical history is significant for obesity,  advanced maternal age and chronic hypertension. Patient also positive for hepatitis B, first detected in her prior pregnancy in 2011.  Pregnancy history fully reviewed.  Pt also had a primary csection in her second pregnancy due to shoulder dystocia.    Patient reports no bleeding, no contractions, no leaking and reports left sided intermittent cramp.  Danley Danker Vitals:   12/01/13 4097 12/01/13 0810  BP: 120/76   Temp: 98.2 F (36.8 C)   Height:  5\' 6"  (1.676 m)  Weight: 343 lb 14.4 oz (155.992 kg)     HISTORY: OB History  Gravida Para Term Preterm AB SAB TAB Ectopic Multiple Living  3 2 2  0 0 0 0 0 0 2    # Outcome Date GA Lbr Len/2nd Weight Sex Delivery Anes PTL Lv  3 CUR           2 TRM 01/05/10 [redacted]w[redacted]d  7 lb 3 oz (3.26 kg) F LTCS Spinal N Y     Comments: Elevated blood pressure on Labetolol + hep b screen/C/S Delivery for shoulder dystocia  1 TRM 02/06/95 [redacted]w[redacted]d  7 lb 7 oz (3.374 kg) F SVD None N Y     Comments: No complications     Past Medical History  Diagnosis Date  . Hypertension   . Seasonal allergies   . Obesity    Past Surgical History  Procedure Laterality Date  . Hip surgery    . Cesarean section     History reviewed. No pertinent family history.   Exam   Filed Vitals:   12/01/13 0807  BP: 120/76  Temp: 98.2 F (36.8 C)   [facility]     Assessment:    Pregnancy:  38 yo G3P2002 at [redacted]w[redacted]d wks IUP Chronic Hepatitis B Patient Active Problem List   Diagnosis Date Noted  . HTN in pregnancy, chronic 11/30/2013  . AMA (advanced maternal age) multigravida 35+ 11/30/2013  . Previous cesarean section 11/30/2013  . ALLERGIC RHINITIS 05/31/2007  . 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 OBESITY 04/28/2007  . ANEMIA-IRON DEFICIENCY 04/28/2007  . PAP SMEAR, ABNORMAL 04/28/2007        Plan:     [facility].   Will collect 24 hr urine and get baseline CMP. Obtain HBV DNA and monitor CMP every 3 months.   Prenatal vitamins. Problem list reviewed and updated. Ultrasound discussed; fetal survey: scheduled with MFM on 12/28/13 Discuss genetic testing at next visit.   Follow up in 2 weeks.   [place] 12/01/2013

## 2013-12-04 LAB — CULTURE, OB URINE

## 2013-12-05 LAB — COMPREHENSIVE METABOLIC PANEL WITH GFR
ALT: 18 U/L (ref 0–35)
AST: 20 U/L (ref 0–37)
Albumin: 3.1 g/dL — ABNORMAL LOW (ref 3.5–5.2)
Alkaline Phosphatase: 58 U/L (ref 39–117)
BUN: 8 mg/dL (ref 6–23)
CO2: 23 meq/L (ref 19–32)
Calcium: 9.1 mg/dL (ref 8.4–10.5)
Chloride: 105 meq/L (ref 96–112)
Creat: 0.65 mg/dL (ref 0.50–1.10)
Glucose, Bld: 80 mg/dL (ref 70–99)
Potassium: 4.1 meq/L (ref 3.5–5.3)
Sodium: 136 meq/L (ref 135–145)
Total Bilirubin: 0.5 mg/dL (ref 0.2–1.2)
Total Protein: 5.9 g/dL — ABNORMAL LOW (ref 6.0–8.3)

## 2013-12-05 NOTE — Addendum Note (Signed)
Addended by: Novella Olive on: 12/05/2013 08:04 AM   Modules accepted: Orders

## 2013-12-07 ENCOUNTER — Encounter: Payer: Self-pay | Admitting: *Deleted

## 2013-12-07 LAB — PROTEIN, URINE, 24 HOUR
PROTEIN 24H UR: 102 mg/d — AB (ref 50–100)
Protein, Urine: 3 mg/dL

## 2013-12-07 LAB — CREATININE CLEARANCE, URINE, 24 HOUR
CREATININE, URINE: 87.7 mg/dL
Creatinine Clearance: 319 mL/min — ABNORMAL HIGH (ref 75–115)
Creatinine, 24H Ur: 2982 mg/d — ABNORMAL HIGH (ref 700–1800)
Creatinine: 0.65 mg/dL (ref 0.50–1.10)

## 2013-12-08 LAB — HEPATITIS B DNA, ULTRAQUANTITATIVE, PCR
HEPATITIS B DNA: 73 [IU]/mL — AB (ref <20)
Hepatitis B DNA (Calc): 425 copies/mL — ABNORMAL HIGH (ref <116)

## 2013-12-12 ENCOUNTER — Encounter: Payer: Self-pay | Admitting: Family

## 2013-12-14 ENCOUNTER — Telehealth (HOSPITAL_COMMUNITY): Payer: Self-pay | Admitting: Maternal and Fetal Medicine

## 2013-12-14 NOTE — Telephone Encounter (Signed)
Called Ann Woods to discuss her cell free fetal DNA test results.  Mrs. Ann Woods had Panorama testing through [facility].  Testing was offered because of AMA.   The patient was identified by name and DOB.  We reviewed that these are within normal limits, showing a less than 1 in 10,000 risk for trisomies 21, 18 and 13, and monosomy X (Turner syndrome).  In addition, the risk for triploidy/vanishing twin and sex chromosome trisomies (47,XXX and 47,XXY) was also low risk.  We reviewed that this testing identifies > 99% of pregnancies with trisomy 77, trisomy 75, sex chromosome trisomies (47,XXX and 47,XXY), and triploidy. The detection rate for trisomy 18 is 96%.  The detection rate for monosomy X is ~92%.  The false positive rate is <0.1% for all conditions. Testing was also consistent with female gender.  She understands that this testing does not identify all genetic conditions.  All questions were answered to her satisfaction, she was encouraged to call with additional questions or concerns.  Cam Hai, MS Certified Genetic Counselor

## 2013-12-22 ENCOUNTER — Ambulatory Visit (INDEPENDENT_AMBULATORY_CARE_PROVIDER_SITE_OTHER): Payer: Medicaid Other | Admitting: Family Medicine

## 2013-12-22 ENCOUNTER — Encounter: Payer: Self-pay | Admitting: Family Medicine

## 2013-12-22 VITALS — BP 131/81 | HR 76 | Temp 97.2°F | Wt 347.5 lb

## 2013-12-22 DIAGNOSIS — O10019 Pre-existing essential hypertension complicating pregnancy, unspecified trimester: Secondary | ICD-10-CM

## 2013-12-22 DIAGNOSIS — O0990 Supervision of high risk pregnancy, unspecified, unspecified trimester: Secondary | ICD-10-CM

## 2013-12-22 DIAGNOSIS — O10919 Unspecified pre-existing hypertension complicating pregnancy, unspecified trimester: Secondary | ICD-10-CM

## 2013-12-22 DIAGNOSIS — Z9889 Other specified postprocedural states: Secondary | ICD-10-CM

## 2013-12-22 DIAGNOSIS — J309 Allergic rhinitis, unspecified: Secondary | ICD-10-CM

## 2013-12-22 DIAGNOSIS — Z98891 History of uterine scar from previous surgery: Secondary | ICD-10-CM

## 2013-12-22 DIAGNOSIS — O09529 Supervision of elderly multigravida, unspecified trimester: Secondary | ICD-10-CM

## 2013-12-22 LAB — POCT URINALYSIS DIP (DEVICE)
Bilirubin Urine: NEGATIVE
GLUCOSE, UA: NEGATIVE mg/dL
HGB URINE DIPSTICK: NEGATIVE
Ketones, ur: NEGATIVE mg/dL
Leukocytes, UA: NEGATIVE
NITRITE: NEGATIVE
PH: 6 (ref 5.0–8.0)
PROTEIN: NEGATIVE mg/dL
Specific Gravity, Urine: 1.015 (ref 1.005–1.030)
UROBILINOGEN UA: 0.2 mg/dL (ref 0.0–1.0)

## 2013-12-22 MED ORDER — CETIRIZINE HCL 10 MG PO TABS: 10.0000 mg | ORAL_TABLET | Freq: Every day | ORAL | Status: DC

## 2013-12-22 NOTE — Progress Notes (Signed)
Normal panorama F/u anatomy next week Desires TOLAC--information given. Normal baseline labs

## 2013-12-22 NOTE — Patient Instructions (Addendum)
For colds and allergies  Any anti-histamine including benadryl, allegra, claritin, etc.  Steroid nose sprays: flonase, nasonex, nasacort Mucinex  Robitussin  For Reflux/heartburn  Pepcid Zantac Tums Prilosec Prevacid  For yeast infections  Monistat  For constipation  Colace  For minor aches and pains  Tylenol-do not take more than 4000mg  in 24 hours. Therma-care or like heat packs  Vaginal Birth After Cesarean Delivery Vaginal birth after cesarean delivery (VBAC) is giving birth vaginally after previously delivering a baby by a cesarean. In the past, if a woman had a cesarean delivery, all births afterwards would be done by cesarean delivery. This is no longer true. It can be safe for the mother to try a vaginal delivery after having a cesarean delivery.  It is important to discuss VBAC with your health care provider early in the pregnancy so you can understand the risks, benefits, and options. It will give you time to decide what is best in your particular case. The final decision about whether to have a VBAC or repeat cesarean delivery should be between you and your health care provider. Any changes in your health or your baby's health during your pregnancy may make it necessary to change your initial decision about VBAC.  WOMEN WHO PLAN TO HAVE A VBAC SHOULD CHECK WITH THEIR HEALTH CARE PROVIDER TO BE SURE THAT:  The previous cesarean delivery was done with a low transverse uterine cut (incision) (not a vertical classical incision).   The birth canal is big enough for the baby.   There were no other operations on the uterus.   An electronic fetal monitor (EFM) will be on at all times during labor.   An operating room will be available and ready in case an emergency cesarean delivery is needed.   A health care provider and surgical nursing staff will be available at all times during labor to be ready to do an emergency delivery cesarean if necessary.   An  anesthesiologist will be present in case an emergency cesarean delivery is needed.   The nursery is prepared and has adequate personnel and necessary equipment available to care for the baby in case of an emergency cesarean delivery. BENEFITS OF VBAC  Shorter stay in the hospital.   Avoidance of risks associated with cesarean delivery, such as:  Surgical complications, such as opening of the incision or hernia in the incision.  Injury to other organs.  Fever. This can occur if an infection develops after surgery. It can also occur as a reaction to the medicine given to make you numb during the surgery.  Less blood loss and need for blood transfusions.  Lower risk of blood clots and infection.  Shorter recovery.   Decreased risk for having to remove the uterus (hysterectomy).   Decreased risk for the placenta to completely or partially cover the opening of the uterus (placenta previa) with a future pregnancy.   Decrease risk in future labor and delivery. RISKS OF A VBAC  Tearing (rupture) of the uterus. This is occurs in less than 1% of VBACs. The risk of this happening is higher if:  Steps are taken to begin the labor process (induce labor) or stimulate or strengthen contractions (augment labor).   Medicine is used to soften (ripen) the cervix.  Having to remove the uterus (hysterectomy) if it ruptures. VBAC SHOULD NOT BE DONE IF:  The previous cesarean delivery was done with a vertical (classical) or T-shaped incision or you do not know what kind of incision  was made.   You had a ruptured uterus.   You have had certain types of surgery on your uterus, such as removal of uterine fibroids. Ask your health care provider about other types of surgeries that prevent you from having a VBAC.  You have certain medical or childbirth (obstetrical) problems.   There are problems with the baby.   You have had two previous cesarean deliveries and no vaginal  deliveries. OTHER FACTS TO KNOW ABOUT VBAC:  It is safe to have an epidural anesthetic with VBAC.   It is safe to turn the baby from a breech position (attempt an external cephalic version).   It is safe to try a VBAC with twins.   VBAC may not be successful if your baby weights 8.8 lb (4 kg) or more. However, weight predictions are not always accurate and should not be used alone to decide if VBAC is right for you.  There is an increased failure rate if the time between the cesarean delivery and VBAC is less than 19 months.   Your health care provider may advise against a VBAC if you have preeclampsia (high blood pressure, protein in the urine, and swelling of face and extremities).   VBAC is often successful if you previously gave birth vaginally.   VBAC is often successful when the labor starts spontaneously before the due date.   Delivering a baby through a VBAC is similar to having a normal spontaneous vaginal delivery. Document Released: 02/01/2007 Document Revised: 06/01/2013 Document Reviewed: 03/10/2013 [facility] Patient Information 2014 [place], [place].  Second Trimester of Pregnancy The second trimester is from week 13 through week 28, months 4 through 6. The second trimester is often a time when you feel your best. Your body has also adjusted to being pregnant, and you begin to feel better physically. Usually, morning sickness has lessened or quit completely, you may have more energy, and you may have an increase in appetite. The second trimester is also a time when the fetus is growing rapidly. At the end of the sixth month, the fetus is about 9 inches long and weighs about 1 pounds. You will likely begin to feel the baby move (quickening) between 18 and 20 weeks of the pregnancy. BODY CHANGES Your body goes through many changes during pregnancy. The changes vary from woman to woman.   Your weight will continue to increase. You will notice your lower abdomen bulging  out.  You may begin to get stretch marks on your hips, abdomen, and breasts.  You may develop headaches that can be relieved by medicines approved by your caregiver.  You may urinate more often because the fetus is pressing on your bladder.  You may develop or continue to have heartburn as a result of your pregnancy.  You may develop constipation because certain hormones are causing the muscles that push waste through your intestines to slow down.  You may develop hemorrhoids or swollen, bulging veins (varicose veins).  You may have back pain because of the weight gain and pregnancy hormones relaxing your joints between the bones in your pelvis and as a result of a shift in weight and the muscles that support your balance.  Your breasts will continue to grow and be tender.  Your gums may bleed and may be sensitive to brushing and flossing.  Dark spots or blotches (chloasma, mask of pregnancy) may develop on your face. This will likely fade after the baby is born.  A dark line from your belly  button to the pubic area (linea nigra) may appear. This will likely fade after the baby is born. WHAT TO EXPECT AT YOUR PRENATAL VISITS During a routine prenatal visit:  You will be weighed to make sure you and the fetus are growing normally.  Your blood pressure will be taken.  Your abdomen will be measured to track your baby's growth.  The fetal heartbeat will be listened to.  Any test results from the previous visit will be discussed. Your caregiver may ask you:  How you are feeling.  If you are feeling the baby move.  If you have had any abnormal symptoms, such as leaking fluid, bleeding, severe headaches, or abdominal cramping.  If you have any questions. Other tests that may be performed during your second trimester include:  Blood tests that check for:  Low iron levels (anemia).  Gestational diabetes (between 24 and 28 weeks).  Rh antibodies.  Urine tests to check for  infections, diabetes, or protein in the urine.  An ultrasound to confirm the proper growth and development of the baby.  An amniocentesis to check for possible genetic problems.  Fetal screens for spina bifida and Down syndrome. HOME CARE INSTRUCTIONS   Avoid all smoking, herbs, alcohol, and unprescribed drugs. These chemicals affect the formation and growth of the baby.  Follow your caregiver's instructions regarding medicine use. There are medicines that are either safe or unsafe to take during pregnancy.  Exercise only as directed by your caregiver. Experiencing uterine cramps is a good sign to stop exercising.  Continue to eat regular, healthy meals.  Wear a good support bra for breast tenderness.  Do not use hot tubs, steam rooms, or saunas.  Wear your seat belt at all times when driving.  Avoid raw meat, uncooked cheese, cat litter boxes, and soil used by cats. These carry germs that can cause birth defects in the baby.  Take your prenatal vitamins.  Try taking a stool softener (if your caregiver approves) if you develop constipation. Eat more high-fiber foods, such as fresh vegetables or fruit and whole grains. Drink plenty of fluids to keep your urine clear or pale yellow.  Take warm sitz baths to soothe any pain or discomfort caused by hemorrhoids. Use hemorrhoid cream if your caregiver approves.  If you develop varicose veins, wear support hose. Elevate your feet for 15 minutes, 3 4 times a day. Limit salt in your diet.  Avoid heavy lifting, wear low heel shoes, and practice good posture.  Rest with your legs elevated if you have leg cramps or low back pain.  Visit your dentist if you have not gone yet during your pregnancy. Use a soft toothbrush to brush your teeth and be gentle when you floss.  A sexual relationship may be continued unless your caregiver directs you otherwise.  Continue to go to all your prenatal visits as directed by your caregiver. SEEK MEDICAL  CARE IF:   You have dizziness.  You have mild pelvic cramps, pelvic pressure, or nagging pain in the abdominal area.  You have persistent nausea, vomiting, or diarrhea.  You have a bad smelling vaginal discharge.  You have pain with urination. SEEK IMMEDIATE MEDICAL CARE IF:   You have a fever.  You are leaking fluid from your vagina.  You have spotting or bleeding from your vagina.  You have severe abdominal cramping or pain.  You have rapid weight gain or loss.  You have shortness of breath with chest pain.  You notice sudden  or extreme swelling of your face, hands, ankles, feet, or legs.  You have not felt your baby move in over an hour.  You have severe headaches that do not go away with medicine.  You have vision changes. Document Released: 08/05/2001 Document Revised: 04/13/2013 Document Reviewed: 10/12/2012 [facility] Patient Information 2014 [place].  Breastfeeding Deciding to breastfeed is one of the best choices you can make for you and your baby. A change in hormones during pregnancy causes your breast tissue to grow and increases the number and size of your milk ducts. These hormones also allow proteins, sugars, and fats from your blood supply to make breast milk in your milk-producing glands. Hormones prevent breast milk from being released before your baby is born as well as prompt milk flow after birth. Once breastfeeding has begun, thoughts of your baby, as well as his or her sucking or crying, can stimulate the release of milk from your milk-producing glands.  BENEFITS OF BREASTFEEDING For Your Baby  Your first milk (colostrum) helps your baby's digestive system function better.   There are antibodies in your milk that help your baby fight off infections.   Your baby has a lower incidence of asthma, allergies, and sudden infant death syndrome.   The nutrients in breast milk are better for your baby than infant formulas and are designed uniquely  for your baby's needs.   Breast milk improves your baby's brain development.   Your baby is less likely to develop other conditions, such as childhood obesity, asthma, or type 2 diabetes mellitus.  For You   Breastfeeding helps to create a very special bond between you and your baby.   Breastfeeding is convenient. Breast milk is always available at the correct temperature and costs nothing.   Breastfeeding helps to burn calories and helps you lose the weight gained during pregnancy.   Breastfeeding makes your uterus contract to its prepregnancy size faster and slows bleeding (lochia) after you give birth.   Breastfeeding helps to lower your risk of developing type 2 diabetes mellitus, osteoporosis, and breast or ovarian cancer later in life. SIGNS THAT YOUR BABY IS HUNGRY Early Signs of Hunger  Increased alertness or activity.  Stretching.  Movement of the head from side to side.  Movement of the head and opening of the mouth when the corner of the mouth or cheek is stroked (rooting).  Increased sucking sounds, smacking lips, cooing, sighing, or squeaking.  Hand-to-mouth movements.  Increased sucking of fingers or hands. Late Signs of Hunger  Fussing.  Intermittent crying. Extreme Signs of Hunger Signs of extreme hunger will require calming and consoling before your baby will be able to breastfeed successfully. Do not wait for the following signs of extreme hunger to occur before you initiate breastfeeding:   Restlessness.  A loud, strong cry.   Screaming. BREASTFEEDING BASICS Breastfeeding Initiation  Find a comfortable place to sit or lie down, with your neck and back well supported.  Place a pillow or rolled up blanket under your baby to bring him or her to the level of your breast (if you are seated). Nursing pillows are specially designed to help support your arms and your baby while you breastfeed.  Make sure that your baby's abdomen is facing your  abdomen.   Gently massage your breast. With your fingertips, massage from your chest wall toward your nipple in a circular motion. This encourages milk flow. You may need to continue this action during the feeding if your milk flows  slowly.  Support your breast with 4 fingers underneath and your thumb above your nipple. Make sure your fingers are well away from your nipple and your baby's mouth.   Stroke your baby's lips gently with your finger or nipple.   When your baby's mouth is open wide enough, quickly bring your baby to your breast, placing your entire nipple and as much of the colored area around your nipple (areola) as possible into your baby's mouth.   More areola should be visible above your baby's upper lip than below the lower lip.   Your baby's tongue should be between his or her lower gum and your breast.   Ensure that your baby's mouth is correctly positioned around your nipple (latched). Your baby's lips should create a seal on your breast and be turned out (everted).  It is common for your baby to suck about 2 3 minutes in order to start the flow of breast milk. Latching Teaching your baby how to latch on to your breast properly is very important. An improper latch can cause nipple pain and decreased milk supply for you and poor weight gain in your baby. Also, if your baby is not latched onto your nipple properly, he or she may swallow some air during feeding. This can make your baby fussy. Burping your baby when you switch breasts during the feeding can help to get rid of the air. However, teaching your baby to latch on properly is still the best way to prevent fussiness from swallowing air while breastfeeding. Signs that your baby has successfully latched on to your nipple:    Silent tugging or silent sucking, without causing you pain.   Swallowing heard between every 3 4 sucks.    Muscle movement above and in front of his or her ears while sucking.  Signs that  your baby has not successfully latched on to nipple:   Sucking sounds or smacking sounds from your baby while breastfeeding.  Nipple pain. If you think your baby has not latched on correctly, slip your finger into the corner of your baby's mouth to break the suction and place it between your baby's gums. Attempt breastfeeding initiation again. Signs of Successful Breastfeeding Signs from your baby:   A gradual decrease in the number of sucks or complete cessation of sucking.   Falling asleep.   Relaxation of his or her body.   Retention of a small amount of milk in his or her mouth.   Letting go of your breast by himself or herself. Signs from you:  Breasts that have increased in firmness, weight, and size 1 3 hours after feeding.   Breasts that are softer immediately after breastfeeding.  Increased milk volume, as well as a change in milk consistency and color by the 5th day of breastfeeding.   Nipples that are not sore, cracked, or bleeding. Signs That Your [employer] is Getting Enough Milk  Wetting at least 3 diapers in a 24-hour period. The urine should be clear and pale yellow by age 240 days.  At least 3 stools in a 24-hour period by age 240 days. The stool should be soft and yellow.  At least 3 stools in a 24-hour period by age 24 days. The stool should be seedy and yellow.  No loss of weight greater than 10% of birth weight during the first 56 days of age.  Average weight gain of 4 7 ounces (120 210 mL) per week after age 73 days.  Consistent daily weight  gain by age 79 days, without weight loss after the age of 2 weeks. After a feeding, your baby may spit up a small amount. This is common. BREASTFEEDING FREQUENCY AND DURATION Frequent feeding will help you make more milk and can prevent sore nipples and breast engorgement. Breastfeed when you feel the need to reduce the fullness of your breasts or when your baby shows signs of hunger. This is called "breastfeeding on  demand." Avoid introducing a pacifier to your baby while you are working to establish breastfeeding (the first 4 6 weeks after your baby is born). After this time you may choose to use a pacifier. Research has shown that pacifier use during the first year of a baby's life decreases the risk of sudden infant death syndrome (SIDS). Allow your baby to feed on each breast as long as he or she wants. Breastfeed until your baby is finished feeding. When your baby unlatches or falls asleep while feeding from the first breast, offer the second breast. Because newborns are often sleepy in the first few weeks of life, you may need to awaken your baby to get him or her to feed. Breastfeeding times will vary from baby to baby. However, the following rules can serve as a guide to help you ensure that your baby is properly fed:  Newborns (babies 47 weeks of age or younger) may breastfeed every 1 3 hours.  Newborns should not go longer than 3 hours during the day or 5 hours during the night without breastfeeding.  You should breastfeed your baby a minimum of 8 times in a 24-hour period until you begin to introduce solid foods to your baby at around 70 months of age. BREAST MILK PUMPING Pumping and storing breast milk allows you to ensure that your baby is exclusively fed your breast milk, even at times when you are unable to breastfeed. This is especially important if you are going back to work while you are still breastfeeding or when you are not able to be present during feedings. Your lactation consultant can give you guidelines on how long it is safe to store breast milk.  A breast pump is a machine that allows you to pump milk from your breast into a sterile bottle. The pumped breast milk can then be stored in a refrigerator or freezer. Some breast pumps are operated by hand, while others use electricity. Ask your lactation consultant which type will work best for you. Breast pumps can be purchased, but some  hospitals and breastfeeding support groups lease breast pumps on a monthly basis. A lactation consultant can teach you how to hand express breast milk, if you prefer not to use a pump.  CARING FOR YOUR BREASTS WHILE YOU BREASTFEED Nipples can become dry, cracked, and sore while breastfeeding. The following recommendations can help keep your breasts moisturized and healthy:  Avoid using soap on your nipples.   Wear a supportive bra. Although not required, special nursing bras and tank tops are designed to allow access to your breasts for breastfeeding without taking off your entire bra or top. Avoid wearing underwire style bras or extremely tight bras.  Air dry your nipples for 3 55minutes after each feeding.   Use only cotton bra pads to absorb leaked breast milk. Leaking of breast milk between feedings is normal.   Use lanolin on your nipples after breastfeeding. Lanolin helps to maintain your skin's normal moisture barrier. If you use pure lanolin you do not need to wash it off before  feeding your baby again. Pure lanolin is not toxic to your baby. You may also hand express a few drops of breast milk and gently massage that milk into your nipples and allow the milk to air dry. In the first few weeks after giving birth, some women experience extremely full breasts (engorgement). Engorgement can make your breasts feel heavy, warm, and tender to the touch. Engorgement peaks within 3 5 days after you give birth. The following recommendations can help ease engorgement:  Completely empty your breasts while breastfeeding or pumping. You may want to start by applying warm, moist heat (in the shower or with warm water-soaked hand towels) just before feeding or pumping. This increases circulation and helps the milk flow. If your baby does not completely empty your breasts while breastfeeding, pump any extra milk after he or she is finished.  Wear a snug bra (nursing or regular) or tank top for 1 2 days  to signal your body to slightly decrease milk production.  Apply ice packs to your breasts, unless this is too uncomfortable for you.  Make sure that your baby is latched on and positioned properly while breastfeeding. If engorgement persists after 48 hours of following these recommendations, contact your health care provider or a Science writer. OVERALL HEALTH CARE RECOMMENDATIONS WHILE BREASTFEEDING  Eat healthy foods. Alternate between meals and snacks, eating 3 of each per day. Because what you eat affects your breast milk, some of the foods may make your baby more irritable than usual. Avoid eating these foods if you are sure that they are negatively affecting your baby.  Drink milk, fruit juice, and water to satisfy your thirst (about 10 glasses a day).   Rest often, relax, and continue to take your prenatal vitamins to prevent fatigue, stress, and anemia.  Continue breast self-awareness checks.  Avoid chewing and smoking tobacco.  Avoid alcohol and drug use. Some medicines that may be harmful to your baby can pass through breast milk. It is important to ask your health care provider before taking any medicine, including all over-the-counter and prescription medicine as well as vitamin and herbal supplements. It is possible to become pregnant while breastfeeding. If birth control is desired, ask your health care provider about options that will be safe for your baby. SEEK MEDICAL CARE IF:   You feel like you want to stop breastfeeding or have become frustrated with breastfeeding.  You have painful breasts or nipples.  Your nipples are cracked or bleeding.  Your breasts are red, tender, or warm.  You have a swollen area on either breast.  You have a fever or chills.  You have nausea or vomiting.  You have drainage other than breast milk from your nipples.  Your breasts do not become full before feedings by the 5th day after you give birth.  You feel sad and  depressed.  Your baby is too sleepy to eat well.  Your baby is having trouble sleeping.   Your baby is wetting less than 3 diapers in a 24-hour period.  Your baby has less than 3 stools in a 24-hour period.  Your baby's skin or the white part of his or her eyes becomes yellow.   Your baby is not gaining weight by 13 days of age. SEEK IMMEDIATE MEDICAL CARE IF:   Your baby is overly tired (lethargic) and does not want to wake up and feed.  Your baby develops an unexplained fever. Document Released: 08/11/2005 Document Revised: 04/13/2013 Document Reviewed: 02/02/2013 [employer] Patient  Information 2014 [employer].  

## 2013-12-22 NOTE — Progress Notes (Signed)
Patient reports pain in RLQ

## 2013-12-28 ENCOUNTER — Ambulatory Visit (HOSPITAL_COMMUNITY)
Admission: RE | Admit: 2013-12-28 | Discharge: 2013-12-28 | Disposition: A | Discharge status: Home / Self Care | Payer: Medicaid Other | Source: Ambulatory Visit | Attending: Nurse Practitioner | Admitting: Nurse Practitioner

## 2013-12-28 ENCOUNTER — Other Ambulatory Visit (HOSPITAL_COMMUNITY): Payer: Self-pay | Admitting: Maternal and Fetal Medicine

## 2013-12-28 ENCOUNTER — Other Ambulatory Visit (HOSPITAL_COMMUNITY): Payer: Self-pay | Admitting: Nurse Practitioner

## 2013-12-28 DIAGNOSIS — O09529 Supervision of elderly multigravida, unspecified trimester: Secondary | ICD-10-CM

## 2013-12-28 DIAGNOSIS — O10019 Pre-existing essential hypertension complicating pregnancy, unspecified trimester: Secondary | ICD-10-CM

## 2013-12-28 DIAGNOSIS — O3421 Maternal care for scar from previous cesarean delivery: Secondary | ICD-10-CM

## 2013-12-28 DIAGNOSIS — E669 Obesity, unspecified: Secondary | ICD-10-CM

## 2013-12-28 DIAGNOSIS — O99210 Obesity complicating pregnancy, unspecified trimester: Secondary | ICD-10-CM

## 2014-01-12 ENCOUNTER — Ambulatory Visit (INDEPENDENT_AMBULATORY_CARE_PROVIDER_SITE_OTHER): Payer: Medicaid Other | Admitting: Family Medicine

## 2014-01-12 VITALS — BP 124/78 | HR 80 | Temp 98.1°F | Wt 348.1 lb

## 2014-01-12 DIAGNOSIS — O10019 Pre-existing essential hypertension complicating pregnancy, unspecified trimester: Secondary | ICD-10-CM

## 2014-01-12 DIAGNOSIS — O10919 Unspecified pre-existing hypertension complicating pregnancy, unspecified trimester: Secondary | ICD-10-CM

## 2014-01-12 LAB — POCT URINALYSIS DIP (DEVICE)
Bilirubin Urine: NEGATIVE
Glucose, UA: NEGATIVE mg/dL
Hgb urine dipstick: NEGATIVE
Ketones, ur: NEGATIVE mg/dL
LEUKOCYTES UA: NEGATIVE
NITRITE: NEGATIVE
PH: 6 (ref 5.0–8.0)
PROTEIN: NEGATIVE mg/dL
Specific Gravity, Urine: 1.01 (ref 1.005–1.030)
UROBILINOGEN UA: 0.2 mg/dL (ref 0.0–1.0)

## 2014-01-12 NOTE — Patient Instructions (Signed)
Second Trimester of Pregnancy The second trimester is from week 13 through week 28, months 4 through 6. The second trimester is often a time when you feel your best. Your body has also adjusted to being pregnant, and you begin to feel better physically. Usually, morning sickness has lessened or quit completely, you may have more energy, and you may have an increase in appetite. The second trimester is also a time when the fetus is growing rapidly. At the end of the sixth month, the fetus is about 9 inches long and weighs about 1 pounds. You will likely begin to feel the baby move (quickening) between 18 and 20 weeks of the pregnancy. BODY CHANGES Your body goes through many changes during pregnancy. The changes vary from woman to woman.   Your weight will continue to increase. You will notice your lower abdomen bulging out.  You may begin to get stretch marks on your hips, abdomen, and breasts.  You may develop headaches that can be relieved by medicines approved by your caregiver.  You may urinate more often because the fetus is pressing on your bladder.  You may develop or continue to have heartburn as a result of your pregnancy.  You may develop constipation because certain hormones are causing the muscles that push waste through your intestines to slow down.  You may develop hemorrhoids or swollen, bulging veins (varicose veins).  You may have back pain because of the weight gain and pregnancy hormones relaxing your joints between the bones in your pelvis and as a result of a shift in weight and the muscles that support your balance.  Your breasts will continue to grow and be tender.  Your gums may bleed and may be sensitive to brushing and flossing.  Dark spots or blotches (chloasma, mask of pregnancy) may develop on your face. This will likely fade after the baby is born.  A dark line from your belly button to the pubic area (linea nigra) may appear. This will likely fade after  the baby is born. WHAT TO EXPECT AT YOUR PRENATAL VISITS During a routine prenatal visit:  You will be weighed to make sure you and the fetus are growing normally.  Your blood pressure will be taken.  Your abdomen will be measured to track your baby's growth.  The fetal heartbeat will be listened to.  Any test results from the previous visit will be discussed. Your caregiver may ask you:  How you are feeling.  If you are feeling the baby move.  If you have had any abnormal symptoms, such as leaking fluid, bleeding, severe headaches, or abdominal cramping.  If you have any questions. Other tests that may be performed during your second trimester include:  Blood tests that check for:  Low iron levels (anemia).  Gestational diabetes (between 24 and 28 weeks).  Rh antibodies.  Urine tests to check for infections, diabetes, or protein in the urine.  An ultrasound to confirm the proper growth and development of the baby.  An amniocentesis to check for possible genetic problems.  Fetal screens for spina bifida and Down syndrome. HOME CARE INSTRUCTIONS   Avoid all smoking, herbs, alcohol, and unprescribed drugs. These chemicals affect the formation and growth of the baby.  Follow your caregiver's instructions regarding medicine use. There are medicines that are either safe or unsafe to take during pregnancy.  Exercise only as directed by your caregiver. Experiencing uterine cramps is a good sign to stop exercising.  Continue to eat regular,  healthy meals.  Wear a good support bra for breast tenderness.  Do not use hot tubs, steam rooms, or saunas.  Wear your seat belt at all times when driving.  Avoid raw meat, uncooked cheese, cat litter boxes, and soil used by cats. These carry germs that can cause birth defects in the baby.  Take your prenatal vitamins.  Try taking a stool softener (if your caregiver approves) if you develop constipation. Eat more high-fiber  foods, such as fresh vegetables or fruit and whole grains. Drink plenty of fluids to keep your urine clear or pale yellow.  Take warm sitz baths to soothe any pain or discomfort caused by hemorrhoids. Use hemorrhoid cream if your caregiver approves.  If you develop varicose veins, wear support hose. Elevate your feet for 15 minutes, 3 4 times a day. Limit salt in your diet.  Avoid heavy lifting, wear low heel shoes, and practice good posture.  Rest with your legs elevated if you have leg cramps or low back pain.  Visit your dentist if you have not gone yet during your pregnancy. Use a soft toothbrush to brush your teeth and be gentle when you floss.  A sexual relationship may be continued unless your caregiver directs you otherwise.  Continue to go to all your prenatal visits as directed by your caregiver. SEEK MEDICAL CARE IF:   You have dizziness.  You have mild pelvic cramps, pelvic pressure, or nagging pain in the abdominal area.  You have persistent nausea, vomiting, or diarrhea.  You have a bad smelling vaginal discharge.  You have pain with urination. SEEK IMMEDIATE MEDICAL CARE IF:   You have a fever.  You are leaking fluid from your vagina.  You have spotting or bleeding from your vagina.  You have severe abdominal cramping or pain.  You have rapid weight gain or loss.  You have shortness of breath with chest pain.  You notice sudden or extreme swelling of your face, hands, ankles, feet, or legs.  You have not felt your baby move in over an hour.  You have severe headaches that do not go away with medicine.  You have vision changes. Document Released: 08/05/2001 Document Revised: 04/13/2013 Document Reviewed: 10/12/2012 [facility] Patient Information 2014 [place].  Breastfeeding Deciding to breastfeed is one of the best choices you can make for you and your baby. A change in hormones during pregnancy causes your breast tissue to grow and increases the  number and size of your milk ducts. These hormones also allow proteins, sugars, and fats from your blood supply to make breast milk in your milk-producing glands. Hormones prevent breast milk from being released before your baby is born as well as prompt milk flow after birth. Once breastfeeding has begun, thoughts of your baby, as well as his or her sucking or crying, can stimulate the release of milk from your milk-producing glands.  BENEFITS OF BREASTFEEDING For Your Baby  Your first milk (colostrum) helps your baby's digestive system function better.   There are antibodies in your milk that help your baby fight off infections.   Your baby has a lower incidence of asthma, allergies, and sudden infant death syndrome.   The nutrients in breast milk are better for your baby than infant formulas and are designed uniquely for your baby's needs.   Breast milk improves your baby's brain development.   Your baby is less likely to develop other conditions, such as childhood obesity, asthma, or type 2 diabetes mellitus.  For  You   Breastfeeding helps to create a very special bond between you and your baby.   Breastfeeding is convenient. Breast milk is always available at the correct temperature and costs nothing.   Breastfeeding helps to burn calories and helps you lose the weight gained during pregnancy.   Breastfeeding makes your uterus contract to its prepregnancy size faster and slows bleeding (lochia) after you give birth.   Breastfeeding helps to lower your risk of developing type 2 diabetes mellitus, osteoporosis, and breast or ovarian cancer later in life. SIGNS THAT YOUR BABY IS HUNGRY Early Signs of Hunger  Increased alertness or activity.  Stretching.  Movement of the head from side to side.  Movement of the head and opening of the mouth when the corner of the mouth or cheek is stroked (rooting).  Increased sucking sounds, smacking lips, cooing, sighing, or  squeaking.  Hand-to-mouth movements.  Increased sucking of fingers or hands. Late Signs of Hunger  Fussing.  Intermittent crying. Extreme Signs of Hunger Signs of extreme hunger will require calming and consoling before your baby will be able to breastfeed successfully. Do not wait for the following signs of extreme hunger to occur before you initiate breastfeeding:   Restlessness.  A loud, strong cry.   Screaming. BREASTFEEDING BASICS Breastfeeding Initiation  Find a comfortable place to sit or lie down, with your neck and back well supported.  Place a pillow or rolled up blanket under your baby to bring him or her to the level of your breast (if you are seated). Nursing pillows are specially designed to help support your arms and your baby while you breastfeed.  Make sure that your baby's abdomen is facing your abdomen.   Gently massage your breast. With your fingertips, massage from your chest wall toward your nipple in a circular motion. This encourages milk flow. You may need to continue this action during the feeding if your milk flows slowly.  Support your breast with 4 fingers underneath and your thumb above your nipple. Make sure your fingers are well away from your nipple and your baby's mouth.   Stroke your baby's lips gently with your finger or nipple.   When your baby's mouth is open wide enough, quickly bring your baby to your breast, placing your entire nipple and as much of the colored area around your nipple (areola) as possible into your baby's mouth.   More areola should be visible above your baby's upper lip than below the lower lip.   Your baby's tongue should be between his or her lower gum and your breast.   Ensure that your baby's mouth is correctly positioned around your nipple (latched). Your baby's lips should create a seal on your breast and be turned out (everted).  It is common for your baby to suck about 2 3 minutes in order to start the  flow of breast milk. Latching Teaching your baby how to latch on to your breast properly is very important. An improper latch can cause nipple pain and decreased milk supply for you and poor weight gain in your baby. Also, if your baby is not latched onto your nipple properly, he or she may swallow some air during feeding. This can make your baby fussy. Burping your baby when you switch breasts during the feeding can help to get rid of the air. However, teaching your baby to latch on properly is still the best way to prevent fussiness from swallowing air while breastfeeding. Signs that your baby has  successfully latched on to your nipple:    Silent tugging or silent sucking, without causing you pain.   Swallowing heard between every 3 4 sucks.    Muscle movement above and in front of his or her ears while sucking.  Signs that your baby has not successfully latched on to nipple:   Sucking sounds or smacking sounds from your baby while breastfeeding.  Nipple pain. If you think your baby has not latched on correctly, slip your finger into the corner of your baby's mouth to break the suction and place it between your baby's gums. Attempt breastfeeding initiation again. Signs of Successful Breastfeeding Signs from your baby:   A gradual decrease in the number of sucks or complete cessation of sucking.   Falling asleep.   Relaxation of his or her body.   Retention of a small amount of milk in his or her mouth.   Letting go of your breast by himself or herself. Signs from you:  Breasts that have increased in firmness, weight, and size 1 3 hours after feeding.   Breasts that are softer immediately after breastfeeding.  Increased milk volume, as well as a change in milk consistency and color by the 5th day of breastfeeding.   Nipples that are not sore, cracked, or bleeding. Signs That Your [employer] is Getting Enough Milk  Wetting at least 3 diapers in a 24-hour period. The urine  should be clear and pale yellow by age 64 days.  At least 3 stools in a 24-hour period by age 64 days. The stool should be soft and yellow.  At least 3 stools in a 24-hour period by age 616 days. The stool should be seedy and yellow.  No loss of weight greater than 10% of birth weight during the first 91 days of age.  Average weight gain of 4 7 ounces (120 210 mL) per week after age 61 days.  Consistent daily weight gain by age 65 days, without weight loss after the age of 2 weeks. After a feeding, your baby may spit up a small amount. This is common. BREASTFEEDING FREQUENCY AND DURATION Frequent feeding will help you make more milk and can prevent sore nipples and breast engorgement. Breastfeed when you feel the need to reduce the fullness of your breasts or when your baby shows signs of hunger. This is called "breastfeeding on demand." Avoid introducing a pacifier to your baby while you are working to establish breastfeeding (the first 4 6 weeks after your baby is born). After this time you may choose to use a pacifier. Research has shown that pacifier use during the first year of a baby's life decreases the risk of sudden infant death syndrome (SIDS). Allow your baby to feed on each breast as long as he or she wants. Breastfeed until your baby is finished feeding. When your baby unlatches or falls asleep while feeding from the first breast, offer the second breast. Because newborns are often sleepy in the first few weeks of life, you may need to awaken your baby to get him or her to feed. Breastfeeding times will vary from baby to baby. However, the following rules can serve as a guide to help you ensure that your baby is properly fed:  Newborns (babies 53 weeks of age or younger) may breastfeed every 1 3 hours.  Newborns should not go longer than 3 hours during the day or 5 hours during the night without breastfeeding.  You should breastfeed your baby a minimum of  8 times in a 24-hour period until  you begin to introduce solid foods to your baby at around 55 months of age. BREAST MILK PUMPING Pumping and storing breast milk allows you to ensure that your baby is exclusively fed your breast milk, even at times when you are unable to breastfeed. This is especially important if you are going back to work while you are still breastfeeding or when you are not able to be present during feedings. Your lactation consultant can give you guidelines on how long it is safe to store breast milk.  A breast pump is a machine that allows you to pump milk from your breast into a sterile bottle. The pumped breast milk can then be stored in a refrigerator or freezer. Some breast pumps are operated by hand, while others use electricity. Ask your lactation consultant which type will work best for you. Breast pumps can be purchased, but some hospitals and breastfeeding support groups lease breast pumps on a monthly basis. A lactation consultant can teach you how to hand express breast milk, if you prefer not to use a pump.  CARING FOR YOUR BREASTS WHILE YOU BREASTFEED Nipples can become dry, cracked, and sore while breastfeeding. The following recommendations can help keep your breasts moisturized and healthy:  Avoid using soap on your nipples.   Wear a supportive bra. Although not required, special nursing bras and tank tops are designed to allow access to your breasts for breastfeeding without taking off your entire bra or top. Avoid wearing underwire style bras or extremely tight bras.  Air dry your nipples for 3 4minutes after each feeding.   Use only cotton bra pads to absorb leaked breast milk. Leaking of breast milk between feedings is normal.   Use lanolin on your nipples after breastfeeding. Lanolin helps to maintain your skin's normal moisture barrier. If you use pure lanolin you do not need to wash it off before feeding your baby again. Pure lanolin is not toxic to your baby. You may also hand express a  few drops of breast milk and gently massage that milk into your nipples and allow the milk to air dry. In the first few weeks after giving birth, some women experience extremely full breasts (engorgement). Engorgement can make your breasts feel heavy, warm, and tender to the touch. Engorgement peaks within 3 5 days after you give birth. The following recommendations can help ease engorgement:  Completely empty your breasts while breastfeeding or pumping. You may want to start by applying warm, moist heat (in the shower or with warm water-soaked hand towels) just before feeding or pumping. This increases circulation and helps the milk flow. If your baby does not completely empty your breasts while breastfeeding, pump any extra milk after he or she is finished.  Wear a snug bra (nursing or regular) or tank top for 1 2 days to signal your body to slightly decrease milk production.  Apply ice packs to your breasts, unless this is too uncomfortable for you.  Make sure that your baby is latched on and positioned properly while breastfeeding. If engorgement persists after 48 hours of following these recommendations, contact your health care provider or a Science writer. OVERALL HEALTH CARE RECOMMENDATIONS WHILE BREASTFEEDING  Eat healthy foods. Alternate between meals and snacks, eating 3 of each per day. Because what you eat affects your breast milk, some of the foods may make your baby more irritable than usual. Avoid eating these foods if you are sure that they are  negatively affecting your baby.  Drink milk, fruit juice, and water to satisfy your thirst (about 10 glasses a day).   Rest often, relax, and continue to take your prenatal vitamins to prevent fatigue, stress, and anemia.  Continue breast self-awareness checks.  Avoid chewing and smoking tobacco.  Avoid alcohol and drug use. Some medicines that may be harmful to your baby can pass through breast milk. It is important to ask your  health care provider before taking any medicine, including all over-the-counter and prescription medicine as well as vitamin and herbal supplements. It is possible to become pregnant while breastfeeding. If birth control is desired, ask your health care provider about options that will be safe for your baby. SEEK MEDICAL CARE IF:   You feel like you want to stop breastfeeding or have become frustrated with breastfeeding.  You have painful breasts or nipples.  Your nipples are cracked or bleeding.  Your breasts are red, tender, or warm.  You have a swollen area on either breast.  You have a fever or chills.  You have nausea or vomiting.  You have drainage other than breast milk from your nipples.  Your breasts do not become full before feedings by the 5th day after you give birth.  You feel sad and depressed.  Your baby is too sleepy to eat well.  Your baby is having trouble sleeping.   Your baby is wetting less than 3 diapers in a 24-hour period.  Your baby has less than 3 stools in a 24-hour period.  Your baby's skin or the white part of his or her eyes becomes yellow.   Your baby is not gaining weight by 5 days of age. SEEK IMMEDIATE MEDICAL CARE IF:   Your baby is overly tired (lethargic) and does not want to wake up and feed.  Your baby develops an unexplained fever. Document Released: 08/11/2005 Document Revised: 04/13/2013 Document Reviewed: 02/02/2013 [employer] Patient Information 2014 [employer].  

## 2014-01-12 NOTE — Progress Notes (Signed)
Doing well. BP is well controlled U/S with MFM on 5/6 showed nml growth--f/u scheduled

## 2014-01-18 ENCOUNTER — Encounter: Payer: Self-pay | Admitting: *Deleted

## 2014-01-25 ENCOUNTER — Encounter (HOSPITAL_COMMUNITY): Payer: Self-pay

## 2014-01-25 ENCOUNTER — Ambulatory Visit (HOSPITAL_COMMUNITY)
Admission: RE | Admit: 2014-01-25 | Discharge: 2014-01-25 | Disposition: A | Discharge status: Home / Self Care | Payer: Medicaid Other | Source: Ambulatory Visit | Attending: Nurse Practitioner | Admitting: Nurse Practitioner

## 2014-01-25 VITALS — BP 126/68 | HR 81 | Wt 345.2 lb

## 2014-01-25 DIAGNOSIS — B1910 Unspecified viral hepatitis B without hepatic coma: Secondary | ICD-10-CM

## 2014-01-25 DIAGNOSIS — Z3689 Encounter for other specified antenatal screening: Secondary | ICD-10-CM | POA: Insufficient documentation

## 2014-01-25 DIAGNOSIS — O10019 Pre-existing essential hypertension complicating pregnancy, unspecified trimester: Secondary | ICD-10-CM | POA: Insufficient documentation

## 2014-01-25 DIAGNOSIS — O10919 Unspecified pre-existing hypertension complicating pregnancy, unspecified trimester: Secondary | ICD-10-CM

## 2014-01-25 DIAGNOSIS — O09529 Supervision of elderly multigravida, unspecified trimester: Secondary | ICD-10-CM

## 2014-02-02 ENCOUNTER — Ambulatory Visit (INDEPENDENT_AMBULATORY_CARE_PROVIDER_SITE_OTHER): Payer: Medicaid Other | Admitting: Obstetrics and Gynecology

## 2014-02-02 ENCOUNTER — Encounter: Payer: Self-pay | Admitting: Obstetrics and Gynecology

## 2014-02-02 VITALS — BP 110/69 | HR 82 | Temp 97.7°F | Wt 347.0 lb

## 2014-02-02 DIAGNOSIS — O34219 Maternal care for unspecified type scar from previous cesarean delivery: Secondary | ICD-10-CM

## 2014-02-02 DIAGNOSIS — O10919 Unspecified pre-existing hypertension complicating pregnancy, unspecified trimester: Secondary | ICD-10-CM

## 2014-02-02 DIAGNOSIS — E669 Obesity, unspecified: Secondary | ICD-10-CM

## 2014-02-02 DIAGNOSIS — O09529 Supervision of elderly multigravida, unspecified trimester: Secondary | ICD-10-CM

## 2014-02-02 DIAGNOSIS — B1910 Unspecified viral hepatitis B without hepatic coma: Secondary | ICD-10-CM

## 2014-02-02 DIAGNOSIS — O0990 Supervision of high risk pregnancy, unspecified, unspecified trimester: Secondary | ICD-10-CM

## 2014-02-02 DIAGNOSIS — O10019 Pre-existing essential hypertension complicating pregnancy, unspecified trimester: Secondary | ICD-10-CM

## 2014-02-02 LAB — POCT URINALYSIS DIP (DEVICE)
Glucose, UA: NEGATIVE mg/dL
HGB URINE DIPSTICK: NEGATIVE
KETONES UR: NEGATIVE mg/dL
Nitrite: NEGATIVE
PH: 6 (ref 5.0–8.0)
Protein, ur: 30 mg/dL — AB
SPECIFIC GRAVITY, URINE: 1.025 (ref 1.005–1.030)
Urobilinogen, UA: 0.2 mg/dL (ref 0.0–1.0)

## 2014-02-02 NOTE — Progress Notes (Signed)
Patient is doing well without complaints. BP well controlled without medications. Normal growth on 6/3- F/U growth with MFM on 6/30. FM/PTL precautions reviewed. 1 hr GCT and labs next visit

## 2014-02-02 NOTE — Progress Notes (Signed)
Occasional edema in ankles.

## 2014-02-06 ENCOUNTER — Other Ambulatory Visit: Payer: Medicaid Other

## 2014-02-06 DIAGNOSIS — Z3480 Encounter for supervision of other normal pregnancy, unspecified trimester: Secondary | ICD-10-CM

## 2014-02-07 ENCOUNTER — Encounter: Payer: Self-pay | Admitting: Family

## 2014-02-07 LAB — GLUCOSE TOLERANCE, 1 HOUR (50G) W/O FASTING: Glucose, 1 Hour GTT: 85 mg/dL (ref 70–140)

## 2014-02-16 ENCOUNTER — Ambulatory Visit (INDEPENDENT_AMBULATORY_CARE_PROVIDER_SITE_OTHER): Payer: Medicaid Other | Admitting: Obstetrics and Gynecology

## 2014-02-16 ENCOUNTER — Encounter: Payer: Self-pay | Admitting: Obstetrics and Gynecology

## 2014-02-16 VITALS — BP 120/79 | HR 85 | Temp 97.8°F | Wt 347.3 lb

## 2014-02-16 DIAGNOSIS — Z23 Encounter for immunization: Secondary | ICD-10-CM

## 2014-02-16 DIAGNOSIS — O09523 Supervision of elderly multigravida, third trimester: Secondary | ICD-10-CM

## 2014-02-16 DIAGNOSIS — O10913 Unspecified pre-existing hypertension complicating pregnancy, third trimester: Secondary | ICD-10-CM

## 2014-02-16 DIAGNOSIS — O10019 Pre-existing essential hypertension complicating pregnancy, unspecified trimester: Secondary | ICD-10-CM

## 2014-02-16 DIAGNOSIS — O09529 Supervision of elderly multigravida, unspecified trimester: Secondary | ICD-10-CM

## 2014-02-16 DIAGNOSIS — O0990 Supervision of high risk pregnancy, unspecified, unspecified trimester: Secondary | ICD-10-CM

## 2014-02-16 DIAGNOSIS — E669 Obesity, unspecified: Secondary | ICD-10-CM

## 2014-02-16 DIAGNOSIS — O0993 Supervision of high risk pregnancy, unspecified, third trimester: Secondary | ICD-10-CM

## 2014-02-16 DIAGNOSIS — O34219 Maternal care for unspecified type scar from previous cesarean delivery: Secondary | ICD-10-CM

## 2014-02-16 LAB — CBC
HEMATOCRIT: 31.1 % — AB (ref 36.0–46.0)
HEMOGLOBIN: 10 g/dL — AB (ref 12.0–15.0)
MCH: 25.3 pg — ABNORMAL LOW (ref 26.0–34.0)
MCHC: 32.2 g/dL (ref 30.0–36.0)
MCV: 78.5 fL (ref 78.0–100.0)
PLATELETS: 269 10*3/uL (ref 150–400)
RBC: 3.96 MIL/uL (ref 3.87–5.11)
RDW: 17.3 % — ABNORMAL HIGH (ref 11.5–15.5)
WBC: 6.2 10*3/uL (ref 4.0–10.5)

## 2014-02-16 LAB — POCT URINALYSIS DIP (DEVICE)
BILIRUBIN URINE: NEGATIVE
Glucose, UA: NEGATIVE mg/dL
Hgb urine dipstick: NEGATIVE
Ketones, ur: NEGATIVE mg/dL
LEUKOCYTES UA: NEGATIVE
Nitrite: NEGATIVE
Protein, ur: NEGATIVE mg/dL
Specific Gravity, Urine: 1.01 (ref 1.005–1.030)
Urobilinogen, UA: 0.2 mg/dL (ref 0.0–1.0)
pH: 7 (ref 5.0–8.0)

## 2014-02-16 MED ORDER — TETANUS-DIPHTH-ACELL PERTUSSIS 5-2.5-18.5 LF-MCG/0.5 IM SUSP
0.5000 mL | Freq: Once | INTRAMUSCULAR | Status: AC
  Administered 2014-02-16: 0.5 mL via INTRAMUSCULAR

## 2014-02-16 NOTE — Progress Notes (Signed)
Edema-feet  Pt will need HIV, CBC, RPR, tdap today.

## 2014-02-16 NOTE — Progress Notes (Signed)
Patient is doing well without complaints. FM/PTL precautions reviewed.  

## 2014-02-17 LAB — RPR

## 2014-02-17 LAB — HIV ANTIBODY (ROUTINE TESTING W REFLEX): HIV 1&2 Ab, 4th Generation: NONREACTIVE

## 2014-02-21 ENCOUNTER — Ambulatory Visit (HOSPITAL_COMMUNITY)
Admission: RE | Admit: 2014-02-21 | Discharge: 2014-02-21 | Disposition: A | Discharge status: Home / Self Care | Payer: Medicaid Other | Source: Ambulatory Visit | Attending: Nurse Practitioner | Admitting: Nurse Practitioner

## 2014-02-21 ENCOUNTER — Encounter (HOSPITAL_COMMUNITY): Payer: Self-pay

## 2014-02-21 DIAGNOSIS — O10019 Pre-existing essential hypertension complicating pregnancy, unspecified trimester: Secondary | ICD-10-CM | POA: Insufficient documentation

## 2014-02-21 DIAGNOSIS — O09529 Supervision of elderly multigravida, unspecified trimester: Secondary | ICD-10-CM

## 2014-02-21 DIAGNOSIS — O99210 Obesity complicating pregnancy, unspecified trimester: Secondary | ICD-10-CM

## 2014-02-21 DIAGNOSIS — B1910 Unspecified viral hepatitis B without hepatic coma: Secondary | ICD-10-CM

## 2014-02-21 DIAGNOSIS — E669 Obesity, unspecified: Secondary | ICD-10-CM | POA: Insufficient documentation

## 2014-02-21 DIAGNOSIS — O358XX0 Maternal care for other (suspected) fetal abnormality and damage, not applicable or unspecified: Secondary | ICD-10-CM | POA: Insufficient documentation

## 2014-02-21 DIAGNOSIS — O10919 Unspecified pre-existing hypertension complicating pregnancy, unspecified trimester: Secondary | ICD-10-CM

## 2014-02-21 NOTE — Progress Notes (Signed)
[facility] ultrasound  Indication: 38 yr old G3P2002 at [redacted]w[redacted]d with obesity, chronic hypertension, and fetal pyelectasis for fetal ultrasound.  Findings: 1. Single intrauterine pregnancy. 2. Estimated fetal weight is in the 43rd%. 3. Posterior placenta without evidence of previa. 4. Normal amniotic fluid index. 5. The views of the heart remain limited. 6. The remainder of the limited anatomy survey is normal. 7. The fetal kidneys are normal (left renal pelvis 63mm- normal at 28 weeks is <5mm).  Recommendations: 1. Appropriate fetal growth. 2. Hypertension: - previously counseled - well controlled off of medication - recommend fetal growth every 4 weeks - recommend start antenatal testing at 32 weeks - recommend close surveillance for the development of signs/symptoms of preeclampsia 3. Advanced maternal age: - previously counseled - had normal cell free fetal DNA  [place], MD

## 2014-03-06 ENCOUNTER — Ambulatory Visit (INDEPENDENT_AMBULATORY_CARE_PROVIDER_SITE_OTHER): Payer: Medicaid Other | Admitting: Obstetrics & Gynecology

## 2014-03-06 VITALS — BP 117/74 | HR 85 | Wt 347.3 lb

## 2014-03-06 DIAGNOSIS — Z3480 Encounter for supervision of other normal pregnancy, unspecified trimester: Secondary | ICD-10-CM

## 2014-03-06 DIAGNOSIS — O10019 Pre-existing essential hypertension complicating pregnancy, unspecified trimester: Secondary | ICD-10-CM

## 2014-03-06 DIAGNOSIS — O10913 Unspecified pre-existing hypertension complicating pregnancy, third trimester: Secondary | ICD-10-CM

## 2014-03-06 LAB — POCT URINALYSIS DIP (DEVICE)
Bilirubin Urine: NEGATIVE
Glucose, UA: NEGATIVE mg/dL
HGB URINE DIPSTICK: NEGATIVE
KETONES UR: NEGATIVE mg/dL
Nitrite: NEGATIVE
PROTEIN: NEGATIVE mg/dL
SPECIFIC GRAVITY, URINE: 1.02 (ref 1.005–1.030)
Urobilinogen, UA: 0.2 mg/dL (ref 0.0–1.0)
pH: 6.5 (ref 5.0–8.0)

## 2014-03-06 NOTE — Progress Notes (Signed)
Pt reports pressure but no pain.

## 2014-03-06 NOTE — Progress Notes (Signed)
BP normal no complaints

## 2014-03-06 NOTE — Patient Instructions (Signed)
Third Trimester of Pregnancy The third trimester is from week 29 through week 42, months 7 through 9. The third trimester is a time when the fetus is growing rapidly. At the end of the ninth month, the fetus is about 20 inches in length and weighs 6-10 pounds.  BODY CHANGES Your body goes through many changes during pregnancy. The changes vary from woman to woman.   Your weight will continue to increase. You can expect to gain 25-35 pounds (11-16 kg) by the end of the pregnancy.  You may begin to get stretch marks on your hips, abdomen, and breasts.  You may urinate more often because the fetus is moving lower into your pelvis and pressing on your bladder.  You may develop or continue to have heartburn as a result of your pregnancy.  You may develop constipation because certain hormones are causing the muscles that push waste through your intestines to slow down.  You may develop hemorrhoids or swollen, bulging veins (varicose veins).  You may have pelvic pain because of the weight gain and pregnancy hormones relaxing your joints between the bones in your pelvis. Backaches may result from overexertion of the muscles supporting your posture.  You may have changes in your hair. These can include thickening of your hair, rapid growth, and changes in texture. Some women also have hair loss during or after pregnancy, or hair that feels dry or thin. Your hair will most likely return to normal after your baby is born.  Your breasts will continue to grow and be tender. A yellow discharge may leak from your breasts called colostrum.  Your belly button may stick out.  You may feel short of breath because of your expanding uterus.  You may notice the fetus "dropping," or moving lower in your abdomen.  You may have a bloody mucus discharge. This usually occurs a few days to a week before labor begins.  Your cervix becomes thin and soft (effaced) near your due date. WHAT TO EXPECT AT YOUR PRENATAL  EXAMS  You will have prenatal exams every 2 weeks until week 36. Then, you will have weekly prenatal exams. During a routine prenatal visit:  You will be weighed to make sure you and the fetus are growing normally.  Your blood pressure is taken.  Your abdomen will be measured to track your baby's growth.  The fetal heartbeat will be listened to.  Any test results from the previous visit will be discussed.  You may have a cervical check near your due date to see if you have effaced. At around 36 weeks, your caregiver will check your cervix. At the same time, your caregiver will also perform a test on the secretions of the vaginal tissue. This test is to determine if a type of bacteria, Group B streptococcus, is present. Your caregiver will explain this further. Your caregiver may ask you:  What your birth plan is.  How you are feeling.  If you are feeling the baby move.  If you have had any abnormal symptoms, such as leaking fluid, bleeding, severe headaches, or abdominal cramping.  If you have any questions. Other tests or screenings that may be performed during your third trimester include:  Blood tests that check for low iron levels (anemia).  Fetal testing to check the health, activity level, and growth of the fetus. Testing is done if you have certain medical conditions or if there are problems during the pregnancy. FALSE LABOR You may feel small, irregular contractions that   eventually go away. These are called Braxton Hicks contractions, or false labor. Contractions may last for hours, days, or even weeks before true labor sets in. If contractions come at regular intervals, intensify, or become painful, it is best to be seen by your caregiver.  SIGNS OF LABOR   Menstrual-like cramps.  Contractions that are 5 minutes apart or less.  Contractions that start on the top of the uterus and spread down to the lower abdomen and back.  A sense of increased pelvic pressure or back  pain.  A watery or bloody mucus discharge that comes from the vagina. If you have any of these signs before the 37th week of pregnancy, call your caregiver right away. You need to go to the hospital to get checked immediately. HOME CARE INSTRUCTIONS   Avoid all smoking, herbs, alcohol, and unprescribed drugs. These chemicals affect the formation and growth of the baby.  Follow your caregiver's instructions regarding medicine use. There are medicines that are either safe or unsafe to take during pregnancy.  Exercise only as directed by your caregiver. Experiencing uterine cramps is a good sign to stop exercising.  Continue to eat regular, healthy meals.  Wear a good support bra for breast tenderness.  Do not use hot tubs, steam rooms, or saunas.  Wear your seat belt at all times when driving.  Avoid raw meat, uncooked cheese, cat litter boxes, and soil used by cats. These carry germs that can cause birth defects in the baby.  Take your prenatal vitamins.  Try taking a stool softener (if your caregiver approves) if you develop constipation. Eat more high-fiber foods, such as fresh vegetables or fruit and whole grains. Drink plenty of fluids to keep your urine clear or pale yellow.  Take warm sitz baths to soothe any pain or discomfort caused by hemorrhoids. Use hemorrhoid cream if your caregiver approves.  If you develop varicose veins, wear support hose. Elevate your feet for 15 minutes, 3-4 times a day. Limit salt in your diet.  Avoid heavy lifting, wear low heal shoes, and practice good posture.  Rest a lot with your legs elevated if you have leg cramps or low back pain.  Visit your dentist if you have not gone during your pregnancy. Use a soft toothbrush to brush your teeth and be gentle when you floss.  A sexual relationship may be continued unless your caregiver directs you otherwise.  Do not travel far distances unless it is absolutely necessary and only with the approval  of your caregiver.  Take prenatal classes to understand, practice, and ask questions about the labor and delivery.  Make a trial run to the hospital.  Pack your hospital bag.  Prepare the baby's nursery.  Continue to go to all your prenatal visits as directed by your caregiver. SEEK MEDICAL CARE IF:  You are unsure if you are in labor or if your water has broken.  You have dizziness.  You have mild pelvic cramps, pelvic pressure, or nagging pain in your abdominal area.  You have persistent nausea, vomiting, or diarrhea.  You have a bad smelling vaginal discharge.  You have pain with urination. SEEK IMMEDIATE MEDICAL CARE IF:   You have a fever.  You are leaking fluid from your vagina.  You have spotting or bleeding from your vagina.  You have severe abdominal cramping or pain.  You have rapid weight loss or gain.  You have shortness of breath with chest pain.  You notice sudden or extreme swelling   of your face, hands, ankles, feet, or legs.  You have not felt your baby move in over an hour.  You have severe headaches that do not go away with medicine.  You have vision changes. Document Released: 08/05/2001 Document Revised: 08/16/2013 Document Reviewed: 10/12/2012 [employer] Patient Information 2015 [employer]. This information is not intended to replace advice given to you by your health care provider. Make sure you discuss any questions you have with your health care provider.  

## 2014-03-07 ENCOUNTER — Other Ambulatory Visit (HOSPITAL_COMMUNITY): Payer: Self-pay | Admitting: Nurse Practitioner

## 2014-03-07 DIAGNOSIS — O10019 Pre-existing essential hypertension complicating pregnancy, unspecified trimester: Secondary | ICD-10-CM

## 2014-03-07 DIAGNOSIS — O09529 Supervision of elderly multigravida, unspecified trimester: Secondary | ICD-10-CM

## 2014-03-07 DIAGNOSIS — O99210 Obesity complicating pregnancy, unspecified trimester: Secondary | ICD-10-CM

## 2014-03-07 DIAGNOSIS — O3421 Maternal care for scar from previous cesarean delivery: Secondary | ICD-10-CM

## 2014-03-07 DIAGNOSIS — O358XX0 Maternal care for other (suspected) fetal abnormality and damage, not applicable or unspecified: Secondary | ICD-10-CM

## 2014-03-07 DIAGNOSIS — E669 Obesity, unspecified: Secondary | ICD-10-CM

## 2014-03-20 ENCOUNTER — Ambulatory Visit (INDEPENDENT_AMBULATORY_CARE_PROVIDER_SITE_OTHER): Payer: Medicaid Other | Admitting: Obstetrics & Gynecology

## 2014-03-20 VITALS — BP 133/59 | HR 86 | Temp 97.5°F | Wt 351.2 lb

## 2014-03-20 DIAGNOSIS — O34219 Maternal care for unspecified type scar from previous cesarean delivery: Secondary | ICD-10-CM

## 2014-03-20 DIAGNOSIS — O10019 Pre-existing essential hypertension complicating pregnancy, unspecified trimester: Secondary | ICD-10-CM

## 2014-03-20 DIAGNOSIS — O09523 Supervision of elderly multigravida, third trimester: Secondary | ICD-10-CM

## 2014-03-20 DIAGNOSIS — O10913 Unspecified pre-existing hypertension complicating pregnancy, third trimester: Secondary | ICD-10-CM

## 2014-03-20 DIAGNOSIS — O09529 Supervision of elderly multigravida, unspecified trimester: Secondary | ICD-10-CM

## 2014-03-20 DIAGNOSIS — B1910 Unspecified viral hepatitis B without hepatic coma: Secondary | ICD-10-CM

## 2014-03-20 LAB — POCT URINALYSIS DIP (DEVICE)
BILIRUBIN URINE: NEGATIVE
GLUCOSE, UA: NEGATIVE mg/dL
Hgb urine dipstick: NEGATIVE
Ketones, ur: NEGATIVE mg/dL
Leukocytes, UA: NEGATIVE
Nitrite: NEGATIVE
Protein, ur: NEGATIVE mg/dL
SPECIFIC GRAVITY, URINE: 1.015 (ref 1.005–1.030)
UROBILINOGEN UA: 0.2 mg/dL (ref 0.0–1.0)
pH: 7 (ref 5.0–8.0)

## 2014-03-20 MED ORDER — ASPIRIN EC 81 MG PO TBEC: 81.0000 mg | DELAYED_RELEASE_TABLET | Freq: Every day | ORAL | Status: DC

## 2014-03-20 NOTE — Progress Notes (Signed)
Reports occasional edema in ankles. C/o of intermittent pelvic pressure but denies pain.

## 2014-03-20 NOTE — Patient Instructions (Signed)
Return to clinic for any obstetric concerns or go to [facility] for evaluation  

## 2014-03-20 NOTE — Progress Notes (Signed)
BP stable. Preeclampsia precautions reviewed.  ASA 81 mg prescribed.Growth scan scheduled on 03/22/14. Will start 2x/week testing. Hepatitis B DNA, CMET checked today. No other complaints or concerns.  Fetal movement and labor precautions reviewed.

## 2014-03-21 LAB — COMPREHENSIVE METABOLIC PANEL
ALT: 15 U/L (ref 0–35)
AST: 19 U/L (ref 0–37)
Albumin: 3 g/dL — ABNORMAL LOW (ref 3.5–5.2)
Alkaline Phosphatase: 90 U/L (ref 39–117)
BUN: 8 mg/dL (ref 6–23)
CALCIUM: 8.9 mg/dL (ref 8.4–10.5)
CO2: 24 meq/L (ref 19–32)
Chloride: 105 mEq/L (ref 96–112)
Creat: 0.61 mg/dL (ref 0.50–1.10)
Glucose, Bld: 71 mg/dL (ref 70–99)
Potassium: 4.2 mEq/L (ref 3.5–5.3)
SODIUM: 135 meq/L (ref 135–145)
TOTAL PROTEIN: 6.3 g/dL (ref 6.0–8.3)
Total Bilirubin: 0.5 mg/dL (ref 0.2–1.2)

## 2014-03-22 ENCOUNTER — Ambulatory Visit (HOSPITAL_COMMUNITY)
Admission: RE | Admit: 2014-03-22 | Discharge: 2014-03-22 | Disposition: A | Discharge status: Home / Self Care | Payer: Medicaid Other | Source: Ambulatory Visit | Attending: Obstetrics and Gynecology | Admitting: Obstetrics and Gynecology

## 2014-03-22 ENCOUNTER — Other Ambulatory Visit: Payer: Self-pay | Admitting: Obstetrics & Gynecology

## 2014-03-22 DIAGNOSIS — O10019 Pre-existing essential hypertension complicating pregnancy, unspecified trimester: Secondary | ICD-10-CM | POA: Insufficient documentation

## 2014-03-22 DIAGNOSIS — O09529 Supervision of elderly multigravida, unspecified trimester: Secondary | ICD-10-CM | POA: Diagnosis not present

## 2014-03-22 DIAGNOSIS — O09523 Supervision of elderly multigravida, third trimester: Secondary | ICD-10-CM

## 2014-03-22 DIAGNOSIS — O10913 Unspecified pre-existing hypertension complicating pregnancy, third trimester: Secondary | ICD-10-CM

## 2014-03-22 DIAGNOSIS — Z3689 Encounter for other specified antenatal screening: Secondary | ICD-10-CM | POA: Diagnosis not present

## 2014-03-23 ENCOUNTER — Encounter: Payer: Self-pay | Admitting: *Deleted

## 2014-03-27 ENCOUNTER — Other Ambulatory Visit: Payer: Self-pay | Admitting: Obstetrics & Gynecology

## 2014-03-27 ENCOUNTER — Ambulatory Visit (INDEPENDENT_AMBULATORY_CARE_PROVIDER_SITE_OTHER): Payer: Medicaid Other | Admitting: Obstetrics & Gynecology

## 2014-03-27 VITALS — BP 133/74 | HR 69

## 2014-03-27 DIAGNOSIS — O358XX0 Maternal care for other (suspected) fetal abnormality and damage, not applicable or unspecified: Secondary | ICD-10-CM

## 2014-03-27 DIAGNOSIS — O09529 Supervision of elderly multigravida, unspecified trimester: Secondary | ICD-10-CM

## 2014-03-27 DIAGNOSIS — E669 Obesity, unspecified: Secondary | ICD-10-CM

## 2014-03-27 DIAGNOSIS — O10019 Pre-existing essential hypertension complicating pregnancy, unspecified trimester: Secondary | ICD-10-CM

## 2014-03-27 DIAGNOSIS — O3421 Maternal care for scar from previous cesarean delivery: Secondary | ICD-10-CM

## 2014-03-27 DIAGNOSIS — O99210 Obesity complicating pregnancy, unspecified trimester: Secondary | ICD-10-CM

## 2014-03-27 LAB — POCT URINALYSIS DIP (DEVICE)
Bilirubin Urine: NEGATIVE
GLUCOSE, UA: NEGATIVE mg/dL
Hgb urine dipstick: NEGATIVE
Ketones, ur: NEGATIVE mg/dL
NITRITE: NEGATIVE
Protein, ur: NEGATIVE mg/dL
Specific Gravity, Urine: 1.02 (ref 1.005–1.030)
UROBILINOGEN UA: 0.2 mg/dL (ref 0.0–1.0)
pH: 7 (ref 5.0–8.0)

## 2014-03-27 LAB — HEPATITIS B DNA, ULTRAQUANTITATIVE, PCR
HEPATITIS B DNA (CALC): 826 {copies}/mL — AB (ref <116)
Hepatitis B DNA: 142 IU/mL — ABNORMAL HIGH (ref <20)

## 2014-03-27 NOTE — Progress Notes (Signed)
Last [place] for growth & BPP on 7/27 - will have weekly NST/AFI @ [facility].

## 2014-03-27 NOTE — Patient Instructions (Signed)
Vaginal Birth After Cesarean Delivery Vaginal birth after cesarean delivery (VBAC) is giving birth vaginally after previously delivering a baby by a cesarean. In the past, if a woman had a cesarean delivery, all births afterward would be done by cesarean delivery. This is no longer true. It can be safe for the mother to try a vaginal delivery after having a cesarean delivery.  It is important to discuss VBAC with your health care provider early in the pregnancy so you can understand the risks, benefits, and options. It will give you time to decide what is best in your particular case. The final decision about whether to have a VBAC or repeat cesarean delivery should be between you and your health care provider. Any changes in your health or your baby's health during your pregnancy may make it necessary to change your initial decision about VBAC.  WOMEN WHO PLAN TO HAVE A VBAC SHOULD CHECK WITH THEIR HEALTH CARE PROVIDER TO BE SURE THAT:  The previous cesarean delivery was done with a low transverse uterine cut (incision) (not a vertical classical incision).   The birth canal is big enough for the baby.   There were no other operations on the uterus.   An electronic fetal monitor (EFM) will be on at all times during labor.   An operating room will be available and ready in case an emergency cesarean delivery is needed.   A health care provider and surgical nursing staff will be available at all times during labor to be ready to do an emergency delivery cesarean if necessary.   An anesthesiologist will be present in case an emergency cesarean delivery is needed.   The nursery is prepared and has adequate personnel and necessary equipment available to care for the baby in case of an emergency cesarean delivery. BENEFITS OF VBAC  Shorter stay in the hospital.   Avoidance of risks associated with cesarean delivery, such as:  Surgical complications, such as opening of the incision or  hernia in the incision.  Injury to other organs.  Fever. This can occur if an infection develops after surgery. It can also occur as a reaction to the medicine given to make you numb during the surgery.  Less blood loss and need for blood transfusions.  Lower risk of blood clots and infection.  Shorter recovery.   Decreased risk for having to remove the uterus (hysterectomy).   Decreased risk for the placenta to completely or partially cover the opening of the uterus (placenta previa) with a future pregnancy.   Decrease risk in future labor and delivery. RISKS OF A VBAC  Tearing (rupture) of the uterus. This is occurs in less than 1% of VBACs. The risk of this happening is higher if:  Steps are taken to begin the labor process (induce labor) or stimulate or strengthen contractions (augment labor).   Medicine is used to soften (ripen) the cervix.  Having to remove the uterus (hysterectomy) if it ruptures. VBAC SHOULD NOT BE DONE IF:  The previous cesarean delivery was done with a vertical (classical) or T-shaped incision or you do not know what kind of incision was made.   You had a ruptured uterus.   You have had certain types of surgery on your uterus, such as removal of uterine fibroids. Ask your health care provider about other types of surgeries that prevent you from having a VBAC.  You have certain medical or childbirth (obstetrical) problems.   There are problems with the baby.   You  have had two previous cesarean deliveries and no vaginal deliveries. OTHER FACTS TO KNOW ABOUT VBAC:  It is safe to have an epidural anesthetic with VBAC.   It is safe to turn the baby from a breech position (attempt an external cephalic version).   It is safe to try a VBAC with twins.   VBAC may not be successful if your baby weights 8.8 lb (4 kg) or more. However, weight predictions are not always accurate and should not be used alone to decide if VBAC is right for  you.  There is an increased failure rate if the time between the cesarean delivery and VBAC is less than 19 months.   Your health care provider may advise against a VBAC if you have preeclampsia (high blood pressure, protein in the urine, and swelling of face and extremities).   VBAC is often successful if you previously gave birth vaginally.   VBAC is often successful when the labor starts spontaneously before the due date.   Delivering a baby through a VBAC is similar to having a normal spontaneous vaginal delivery. Document Released: 02/01/2007 Document Revised: 12/26/2013 Document Reviewed: 03/10/2013 [facility] Patient Information 2015 [place], [place]. This information is not intended to replace advice given to you by your health care provider. Make sure you discuss any questions you have with your health care provider.

## 2014-03-27 NOTE — Progress Notes (Signed)
NST reactive, breech on [place] 7/29 with Nl fluid and growth

## 2014-03-30 ENCOUNTER — Other Ambulatory Visit: Payer: Self-pay | Admitting: Obstetrics & Gynecology

## 2014-03-30 ENCOUNTER — Encounter (HOSPITAL_COMMUNITY): Payer: Self-pay

## 2014-03-30 ENCOUNTER — Ambulatory Visit (HOSPITAL_COMMUNITY)
Admission: RE | Admit: 2014-03-30 | Discharge: 2014-03-30 | Disposition: A | Discharge status: Home / Self Care | Payer: Medicaid Other | Source: Ambulatory Visit | Attending: Obstetrics & Gynecology | Admitting: Obstetrics & Gynecology

## 2014-03-30 DIAGNOSIS — Z3689 Encounter for other specified antenatal screening: Secondary | ICD-10-CM | POA: Diagnosis not present

## 2014-03-30 DIAGNOSIS — O99210 Obesity complicating pregnancy, unspecified trimester: Secondary | ICD-10-CM

## 2014-03-30 DIAGNOSIS — O34219 Maternal care for unspecified type scar from previous cesarean delivery: Secondary | ICD-10-CM | POA: Diagnosis not present

## 2014-03-30 DIAGNOSIS — O10019 Pre-existing essential hypertension complicating pregnancy, unspecified trimester: Secondary | ICD-10-CM

## 2014-03-30 DIAGNOSIS — E669 Obesity, unspecified: Secondary | ICD-10-CM

## 2014-03-30 DIAGNOSIS — O3421 Maternal care for scar from previous cesarean delivery: Secondary | ICD-10-CM

## 2014-03-30 DIAGNOSIS — O09529 Supervision of elderly multigravida, unspecified trimester: Secondary | ICD-10-CM | POA: Diagnosis not present

## 2014-03-30 DIAGNOSIS — O358XX0 Maternal care for other (suspected) fetal abnormality and damage, not applicable or unspecified: Secondary | ICD-10-CM

## 2014-04-03 ENCOUNTER — Other Ambulatory Visit: Payer: Self-pay | Admitting: Obstetrics & Gynecology

## 2014-04-03 ENCOUNTER — Ambulatory Visit (INDEPENDENT_AMBULATORY_CARE_PROVIDER_SITE_OTHER): Payer: Medicaid Other | Admitting: Obstetrics & Gynecology

## 2014-04-03 VITALS — BP 108/60 | HR 83 | Wt 349.2 lb

## 2014-04-03 DIAGNOSIS — O358XX0 Maternal care for other (suspected) fetal abnormality and damage, not applicable or unspecified: Secondary | ICD-10-CM

## 2014-04-03 DIAGNOSIS — O98419 Viral hepatitis complicating pregnancy, unspecified trimester: Secondary | ICD-10-CM

## 2014-04-03 DIAGNOSIS — O09523 Supervision of elderly multigravida, third trimester: Secondary | ICD-10-CM

## 2014-04-03 DIAGNOSIS — O10019 Pre-existing essential hypertension complicating pregnancy, unspecified trimester: Secondary | ICD-10-CM

## 2014-04-03 DIAGNOSIS — O99210 Obesity complicating pregnancy, unspecified trimester: Secondary | ICD-10-CM

## 2014-04-03 DIAGNOSIS — B1910 Unspecified viral hepatitis B without hepatic coma: Secondary | ICD-10-CM | POA: Insufficient documentation

## 2014-04-03 DIAGNOSIS — E669 Obesity, unspecified: Secondary | ICD-10-CM

## 2014-04-03 DIAGNOSIS — O09529 Supervision of elderly multigravida, unspecified trimester: Secondary | ICD-10-CM

## 2014-04-03 DIAGNOSIS — O98413 Viral hepatitis complicating pregnancy, third trimester: Secondary | ICD-10-CM

## 2014-04-03 DIAGNOSIS — O3421 Maternal care for scar from previous cesarean delivery: Secondary | ICD-10-CM

## 2014-04-03 DIAGNOSIS — O98519 Other viral diseases complicating pregnancy, unspecified trimester: Secondary | ICD-10-CM

## 2014-04-03 DIAGNOSIS — O34219 Maternal care for unspecified type scar from previous cesarean delivery: Secondary | ICD-10-CM

## 2014-04-03 LAB — POCT URINALYSIS DIP (DEVICE)
GLUCOSE, UA: NEGATIVE mg/dL
Hgb urine dipstick: NEGATIVE
Ketones, ur: NEGATIVE mg/dL
Leukocytes, UA: NEGATIVE
NITRITE: NEGATIVE
Protein, ur: NEGATIVE mg/dL
Specific Gravity, Urine: 1.02 (ref 1.005–1.030)
Urobilinogen, UA: 1 mg/dL (ref 0.0–1.0)
pH: 6.5 (ref 5.0–8.0)

## 2014-04-03 NOTE — Patient Instructions (Signed)
Return to clinic for any obstetric concerns or go to [facility] for evaluation  

## 2014-04-03 NOTE — Progress Notes (Signed)
NST performed today was reviewed and was found to be reactive.  Continue recommended antenatal testing and prenatal care.  Considering BTS, papers signed today.  Stable BP now.  No other complaints or concerns.  Preeclamsia, abor and fetal movement precautions reviewed.

## 2014-04-03 NOTE — Progress Notes (Signed)
Weekly NST/AFI @ [facility]

## 2014-04-06 ENCOUNTER — Ambulatory Visit (HOSPITAL_COMMUNITY): Admission: RE | Admit: 2014-04-06 | Payer: Medicaid Other | Source: Ambulatory Visit

## 2014-04-06 ENCOUNTER — Other Ambulatory Visit: Payer: Self-pay | Admitting: Obstetrics & Gynecology

## 2014-04-06 ENCOUNTER — Encounter (HOSPITAL_COMMUNITY): Payer: Self-pay

## 2014-04-06 ENCOUNTER — Ambulatory Visit (HOSPITAL_COMMUNITY)
Admission: RE | Admit: 2014-04-06 | Discharge: 2014-04-06 | Disposition: A | Discharge status: Home / Self Care | Payer: Medicaid Other | Source: Ambulatory Visit | Attending: Obstetrics & Gynecology | Admitting: Obstetrics & Gynecology

## 2014-04-06 ENCOUNTER — Other Ambulatory Visit: Payer: Medicaid Other

## 2014-04-06 VITALS — BP 128/71 | HR 76 | Wt 351.0 lb

## 2014-04-06 DIAGNOSIS — O34219 Maternal care for unspecified type scar from previous cesarean delivery: Secondary | ICD-10-CM | POA: Diagnosis not present

## 2014-04-06 DIAGNOSIS — O99210 Obesity complicating pregnancy, unspecified trimester: Secondary | ICD-10-CM

## 2014-04-06 DIAGNOSIS — O09523 Supervision of elderly multigravida, third trimester: Secondary | ICD-10-CM

## 2014-04-06 DIAGNOSIS — O10019 Pre-existing essential hypertension complicating pregnancy, unspecified trimester: Secondary | ICD-10-CM

## 2014-04-06 DIAGNOSIS — O3421 Maternal care for scar from previous cesarean delivery: Secondary | ICD-10-CM

## 2014-04-06 DIAGNOSIS — O358XX0 Maternal care for other (suspected) fetal abnormality and damage, not applicable or unspecified: Secondary | ICD-10-CM

## 2014-04-06 DIAGNOSIS — Z3689 Encounter for other specified antenatal screening: Secondary | ICD-10-CM | POA: Insufficient documentation

## 2014-04-06 DIAGNOSIS — E669 Obesity, unspecified: Secondary | ICD-10-CM

## 2014-04-06 DIAGNOSIS — O09529 Supervision of elderly multigravida, unspecified trimester: Secondary | ICD-10-CM | POA: Diagnosis not present

## 2014-04-10 ENCOUNTER — Encounter: Payer: Self-pay | Admitting: Obstetrics and Gynecology

## 2014-04-10 ENCOUNTER — Ambulatory Visit (INDEPENDENT_AMBULATORY_CARE_PROVIDER_SITE_OTHER): Payer: Medicaid Other | Admitting: Obstetrics and Gynecology

## 2014-04-10 VITALS — BP 107/58 | HR 78 | Temp 97.6°F | Wt 349.7 lb

## 2014-04-10 DIAGNOSIS — O10019 Pre-existing essential hypertension complicating pregnancy, unspecified trimester: Secondary | ICD-10-CM

## 2014-04-10 DIAGNOSIS — O0993 Supervision of high risk pregnancy, unspecified, third trimester: Secondary | ICD-10-CM

## 2014-04-10 DIAGNOSIS — O09529 Supervision of elderly multigravida, unspecified trimester: Secondary | ICD-10-CM

## 2014-04-10 DIAGNOSIS — O0990 Supervision of high risk pregnancy, unspecified, unspecified trimester: Secondary | ICD-10-CM

## 2014-04-10 DIAGNOSIS — O09523 Supervision of elderly multigravida, third trimester: Secondary | ICD-10-CM

## 2014-04-10 DIAGNOSIS — O34219 Maternal care for unspecified type scar from previous cesarean delivery: Secondary | ICD-10-CM

## 2014-04-10 LAB — POCT URINALYSIS DIP (DEVICE)
BILIRUBIN URINE: NEGATIVE
Glucose, UA: NEGATIVE mg/dL
KETONES UR: NEGATIVE mg/dL
Nitrite: NEGATIVE
PROTEIN: NEGATIVE mg/dL
Specific Gravity, Urine: 1.015 (ref 1.005–1.030)
Urobilinogen, UA: 0.2 mg/dL (ref 0.0–1.0)
pH: 7 (ref 5.0–8.0)

## 2014-04-10 NOTE — Progress Notes (Signed)
Reports intermittent pelvic pressure and occasional contractions.

## 2014-04-10 NOTE — Progress Notes (Signed)
Patient is doing well without complaints. Last scan was breech and patient is interested in ECV if still breech on next scan which is scheduled for 8/21.  NST reviewed and category I

## 2014-04-13 ENCOUNTER — Encounter: Payer: Self-pay | Admitting: *Deleted

## 2014-04-13 ENCOUNTER — Other Ambulatory Visit: Payer: Medicaid Other

## 2014-04-14 ENCOUNTER — Ambulatory Visit (HOSPITAL_COMMUNITY)
Admission: RE | Admit: 2014-04-14 | Discharge: 2014-04-14 | Disposition: A | Discharge status: Home / Self Care | Payer: Medicaid Other | Source: Ambulatory Visit | Attending: Obstetrics & Gynecology | Admitting: Obstetrics & Gynecology

## 2014-04-14 ENCOUNTER — Encounter (HOSPITAL_COMMUNITY): Payer: Self-pay

## 2014-04-14 VITALS — BP 122/69 | HR 98 | Wt 355.0 lb

## 2014-04-14 DIAGNOSIS — O99210 Obesity complicating pregnancy, unspecified trimester: Secondary | ICD-10-CM

## 2014-04-14 DIAGNOSIS — O3421 Maternal care for scar from previous cesarean delivery: Secondary | ICD-10-CM

## 2014-04-14 DIAGNOSIS — O358XX0 Maternal care for other (suspected) fetal abnormality and damage, not applicable or unspecified: Secondary | ICD-10-CM | POA: Diagnosis present

## 2014-04-14 DIAGNOSIS — O09529 Supervision of elderly multigravida, unspecified trimester: Secondary | ICD-10-CM | POA: Diagnosis not present

## 2014-04-14 DIAGNOSIS — E669 Obesity, unspecified: Secondary | ICD-10-CM | POA: Insufficient documentation

## 2014-04-14 DIAGNOSIS — O09523 Supervision of elderly multigravida, third trimester: Secondary | ICD-10-CM

## 2014-04-14 DIAGNOSIS — O10019 Pre-existing essential hypertension complicating pregnancy, unspecified trimester: Secondary | ICD-10-CM | POA: Diagnosis not present

## 2014-04-14 DIAGNOSIS — O34219 Maternal care for unspecified type scar from previous cesarean delivery: Secondary | ICD-10-CM | POA: Diagnosis not present

## 2014-04-17 ENCOUNTER — Other Ambulatory Visit: Payer: Self-pay | Admitting: Obstetrics & Gynecology

## 2014-04-17 ENCOUNTER — Encounter (HOSPITAL_COMMUNITY): Payer: Self-pay | Admitting: *Deleted

## 2014-04-17 ENCOUNTER — Ambulatory Visit (INDEPENDENT_AMBULATORY_CARE_PROVIDER_SITE_OTHER): Payer: Medicaid Other | Admitting: Obstetrics & Gynecology

## 2014-04-17 ENCOUNTER — Telehealth (HOSPITAL_COMMUNITY): Payer: Self-pay | Admitting: *Deleted

## 2014-04-17 VITALS — BP 116/65 | HR 79 | Wt 353.0 lb

## 2014-04-17 DIAGNOSIS — O0990 Supervision of high risk pregnancy, unspecified, unspecified trimester: Secondary | ICD-10-CM

## 2014-04-17 DIAGNOSIS — O98413 Viral hepatitis complicating pregnancy, third trimester: Secondary | ICD-10-CM

## 2014-04-17 DIAGNOSIS — O0993 Supervision of high risk pregnancy, unspecified, third trimester: Secondary | ICD-10-CM

## 2014-04-17 DIAGNOSIS — Z3493 Encounter for supervision of normal pregnancy, unspecified, third trimester: Secondary | ICD-10-CM

## 2014-04-17 DIAGNOSIS — B1910 Unspecified viral hepatitis B without hepatic coma: Secondary | ICD-10-CM

## 2014-04-17 DIAGNOSIS — O98519 Other viral diseases complicating pregnancy, unspecified trimester: Secondary | ICD-10-CM

## 2014-04-17 DIAGNOSIS — Z3480 Encounter for supervision of other normal pregnancy, unspecified trimester: Secondary | ICD-10-CM

## 2014-04-17 LAB — POCT URINALYSIS DIP (DEVICE)
Bilirubin Urine: NEGATIVE
Glucose, UA: NEGATIVE mg/dL
Hgb urine dipstick: NEGATIVE
Ketones, ur: NEGATIVE mg/dL
Nitrite: NEGATIVE
Protein, ur: NEGATIVE mg/dL
SPECIFIC GRAVITY, URINE: 1.015 (ref 1.005–1.030)
Urobilinogen, UA: 0.2 mg/dL (ref 0.0–1.0)
pH: 7 (ref 5.0–8.0)

## 2014-04-17 LAB — OB RESULTS CONSOLE GC/CHLAMYDIA
CHLAMYDIA, DNA PROBE: NEGATIVE
Gonorrhea: NEGATIVE

## 2014-04-17 LAB — OB RESULTS CONSOLE GBS: GBS: POSITIVE

## 2014-04-17 NOTE — Telephone Encounter (Signed)
Preadmission screen  

## 2014-04-17 NOTE — Progress Notes (Signed)
NST reactive. Breech persists, will schedule ECV, the procedure and risks were explained

## 2014-04-17 NOTE — Progress Notes (Signed)
NST/OBF/Cultures today

## 2014-04-18 LAB — GC/CHLAMYDIA PROBE AMP
CT Probe RNA: NEGATIVE
GC PROBE AMP APTIMA: NEGATIVE

## 2014-04-19 ENCOUNTER — Observation Stay (HOSPITAL_COMMUNITY)
Admission: RE | Admit: 2014-04-19 | Discharge: 2014-04-19 | Disposition: A | Discharge status: Home / Self Care | Payer: Medicaid Other | Source: Ambulatory Visit | Attending: Obstetrics & Gynecology | Admitting: Obstetrics & Gynecology

## 2014-04-19 DIAGNOSIS — Z538 Procedure and treatment not carried out for other reasons: Secondary | ICD-10-CM | POA: Diagnosis not present

## 2014-04-19 DIAGNOSIS — O9989 Other specified diseases and conditions complicating pregnancy, childbirth and the puerperium: Principal | ICD-10-CM

## 2014-04-19 DIAGNOSIS — O99891 Other specified diseases and conditions complicating pregnancy: Principal | ICD-10-CM | POA: Insufficient documentation

## 2014-04-19 DIAGNOSIS — O321XX0 Maternal care for breech presentation, not applicable or unspecified: Secondary | ICD-10-CM | POA: Diagnosis present

## 2014-04-19 NOTE — H&P (Addendum)
Ann Woods is a 38 y.o. female presenting for ECV at [redacted]w[redacted]d . Maternal Medical History:  Reason for admission: Planned ECV    OB History   Grav Para Term Preterm Abortions TAB SAB Ect Mult Living   3 2 2  0 0 0 0 0 0 2     Past Medical History  Diagnosis Date  . Hypertension   . Seasonal allergies   . Obesity    Past Surgical History  Procedure Laterality Date  . Hip surgery    . Cesarean section     Family History: family history is not on file. Social History:  reports that she quit smoking about 20 years ago. She has never used smokeless tobacco. She reports that she does not drink alcohol or use illicit drugs.   Prenatal Transfer Tool  Maternal Diabetes: No Genetic Screening: Normal Maternal Ultrasounds/Referrals: Normal Fetal Ultrasounds or other Referrals:  None Maternal Substance Abuse:  No Significant Maternal Medications:  None Significant Maternal Lab Results:  None Other Comments:  None  Review of Systems  Constitutional: Negative.   Cardiovascular: Negative.   Genitourinary: Negative.       Last menstrual period 08/28/2013. Maternal Exam:  Introitus: Normal vulva.   Physical Exam  Constitutional: She is oriented to person, place, and time. No distress.  obese  Respiratory: Effort normal. No respiratory distress.  GI: Soft. There is no tenderness.  Neurological: She is alert and oriented to person, place, and time.  Skin: Skin is warm and dry.  Psychiatric: She has a normal mood and affect. Her behavior is normal.    Prenatal labs: ABO, Rh: B/Positive/-- (03/30 0000) Antibody:   Rubella: Immune (03/30 0000) RPR: NON REAC (06/25 1207)  HBsAg: Positive (03/31 0000)  HIV: NONREACTIVE (06/25 1207)  GBS:     Assessment/Plan: Breech by recent US, scheduled for ECV today   ARNOLD,JAMES 04/19/2014, 2:15 PM

## 2014-04-19 NOTE — Discharge Summary (Signed)
  Ann Woods is a 38 y.o. female presenting for ECV at [redacted]w[redacted]d . Maternal Medical History:  Reason for admission: Planned ECV    OB History   Grav Para Term Preterm Abortions TAB SAB Ect Mult Living   3 2 2  0 0 0 0 0 0 2     Past Medical History  Diagnosis Date  . Hypertension   . Seasonal allergies   . Obesity    Past Surgical History  Procedure Laterality Date  . Hip surgery    . Cesarean section     Family History: family history is not on file. Social History:  reports that she quit smoking about 20 years ago. She has never used smokeless tobacco. She reports that she does not drink alcohol or use illicit drugs.   Prenatal Transfer Tool  Maternal Diabetes: No Genetic Screening: Normal Maternal Ultrasounds/Referrals: Normal Fetal Ultrasounds or other Referrals:  None Maternal Substance Abuse:  No Significant Maternal Medications:  None Significant Maternal Lab Results:  None Other Comments:  None  Review of Systems  Constitutional: Negative.   Cardiovascular: Negative.   Genitourinary: Negative.       Last menstrual period 08/28/2013. Maternal Exam:  Introitus: Normal vulva.   Physical Exam  Constitutional: She is oriented to person, place, and time. No distress.  obese  Respiratory: Effort normal. No respiratory distress.  GI: Soft. There is no tenderness.  Neurological: She is alert and oriented to person, place, and time.  Skin: Skin is warm and dry.  Psychiatric: She has a normal mood and affect. Her behavior is normal.    Prenatal labs: ABO, Rh: B/Positive/-- (03/30 0000) Antibody:   Rubella: Immune (03/30 0000) RPR: NON REAC (06/25 1207)  HBsAg: Positive (03/31 0000)  HIV: NONREACTIVE (06/25 1207)  GBS:     Assessment/Plan: US shows cephalic presentation today. She was discharged to f/u in clinic as scheduled next week  Woodroe Mode, MD 04/19/2014

## 2014-04-19 NOTE — Progress Notes (Signed)
Suspect of vertex presentation providers on [phone] and [phone] notified of request for [place]

## 2014-04-19 NOTE — Progress Notes (Signed)
Dr Roselie Awkward second reviewer

## 2014-04-20 ENCOUNTER — Ambulatory Visit (HOSPITAL_COMMUNITY)
Admission: RE | Admit: 2014-04-20 | Discharge: 2014-04-20 | Disposition: A | Discharge status: Home / Self Care | Payer: Medicaid Other | Source: Ambulatory Visit | Attending: Obstetrics & Gynecology | Admitting: Obstetrics & Gynecology

## 2014-04-20 ENCOUNTER — Other Ambulatory Visit: Payer: Medicaid Other

## 2014-04-20 DIAGNOSIS — E669 Obesity, unspecified: Secondary | ICD-10-CM | POA: Diagnosis not present

## 2014-04-20 DIAGNOSIS — O358XX0 Maternal care for other (suspected) fetal abnormality and damage, not applicable or unspecified: Secondary | ICD-10-CM | POA: Diagnosis present

## 2014-04-20 DIAGNOSIS — O34219 Maternal care for unspecified type scar from previous cesarean delivery: Secondary | ICD-10-CM | POA: Insufficient documentation

## 2014-04-20 DIAGNOSIS — O3421 Maternal care for scar from previous cesarean delivery: Secondary | ICD-10-CM

## 2014-04-20 DIAGNOSIS — O10019 Pre-existing essential hypertension complicating pregnancy, unspecified trimester: Secondary | ICD-10-CM | POA: Diagnosis not present

## 2014-04-20 DIAGNOSIS — O09529 Supervision of elderly multigravida, unspecified trimester: Secondary | ICD-10-CM | POA: Diagnosis not present

## 2014-04-20 DIAGNOSIS — O99210 Obesity complicating pregnancy, unspecified trimester: Secondary | ICD-10-CM

## 2014-04-20 DIAGNOSIS — O09523 Supervision of elderly multigravida, third trimester: Secondary | ICD-10-CM

## 2014-04-21 LAB — CULTURE, BETA STREP (GROUP B ONLY)

## 2014-04-24 ENCOUNTER — Other Ambulatory Visit: Payer: Self-pay | Admitting: Obstetrics & Gynecology

## 2014-04-24 ENCOUNTER — Ambulatory Visit (INDEPENDENT_AMBULATORY_CARE_PROVIDER_SITE_OTHER): Payer: Medicaid Other | Admitting: Obstetrics & Gynecology

## 2014-04-24 VITALS — BP 127/64 | HR 72 | Wt 354.6 lb

## 2014-04-24 DIAGNOSIS — O10019 Pre-existing essential hypertension complicating pregnancy, unspecified trimester: Secondary | ICD-10-CM

## 2014-04-24 DIAGNOSIS — O3421 Maternal care for scar from previous cesarean delivery: Secondary | ICD-10-CM

## 2014-04-24 DIAGNOSIS — O09523 Supervision of elderly multigravida, third trimester: Secondary | ICD-10-CM

## 2014-04-24 DIAGNOSIS — O0993 Supervision of high risk pregnancy, unspecified, third trimester: Secondary | ICD-10-CM

## 2014-04-24 DIAGNOSIS — O358XX0 Maternal care for other (suspected) fetal abnormality and damage, not applicable or unspecified: Secondary | ICD-10-CM

## 2014-04-24 DIAGNOSIS — E669 Obesity, unspecified: Secondary | ICD-10-CM

## 2014-04-24 DIAGNOSIS — O0990 Supervision of high risk pregnancy, unspecified, unspecified trimester: Secondary | ICD-10-CM

## 2014-04-24 DIAGNOSIS — O99210 Obesity complicating pregnancy, unspecified trimester: Secondary | ICD-10-CM

## 2014-04-24 LAB — POCT URINALYSIS DIP (DEVICE)
Glucose, UA: NEGATIVE mg/dL
NITRITE: NEGATIVE
Protein, ur: NEGATIVE mg/dL
Specific Gravity, Urine: 1.02 (ref 1.005–1.030)
UROBILINOGEN UA: 1 mg/dL (ref 0.0–1.0)
pH: 7 (ref 5.0–8.0)

## 2014-04-24 NOTE — Progress Notes (Signed)
Pt has weekly visits @ [facility] for NST/AFI on Thursdays. Growth this week on 9/3 also. Plan IOL @ 40 wks- will schedule next week.

## 2014-04-24 NOTE — Patient Instructions (Signed)
Return to clinic for any obstetric concerns or go to [facility] for evaluation  

## 2014-04-24 NOTE — Progress Notes (Signed)
NST performed today was reviewed and was found to be reactive.  Continue recommended antenatal testing and prenatal care. Scheduled for ECV, but fetus spontaneously changed to vertex.   Will continue to check during AFI scans and on admission. Growth scan scheduled on 04/27/14. No other complaints or concerns.  Labor and fetal movement precautions reviewed.

## 2014-04-27 ENCOUNTER — Ambulatory Visit (HOSPITAL_COMMUNITY)
Admission: RE | Admit: 2014-04-27 | Discharge: 2014-04-27 | Disposition: A | Discharge status: Home / Self Care | Payer: Medicaid Other | Source: Ambulatory Visit | Attending: Obstetrics & Gynecology | Admitting: Obstetrics & Gynecology

## 2014-04-27 ENCOUNTER — Other Ambulatory Visit: Payer: Self-pay | Admitting: Obstetrics & Gynecology

## 2014-04-27 ENCOUNTER — Encounter (HOSPITAL_COMMUNITY): Payer: Self-pay

## 2014-04-27 DIAGNOSIS — O09523 Supervision of elderly multigravida, third trimester: Secondary | ICD-10-CM

## 2014-04-27 DIAGNOSIS — O10019 Pre-existing essential hypertension complicating pregnancy, unspecified trimester: Secondary | ICD-10-CM

## 2014-04-27 DIAGNOSIS — E669 Obesity, unspecified: Secondary | ICD-10-CM

## 2014-04-27 DIAGNOSIS — O09529 Supervision of elderly multigravida, unspecified trimester: Secondary | ICD-10-CM | POA: Insufficient documentation

## 2014-04-27 DIAGNOSIS — O358XX0 Maternal care for other (suspected) fetal abnormality and damage, not applicable or unspecified: Secondary | ICD-10-CM | POA: Insufficient documentation

## 2014-04-27 DIAGNOSIS — O34219 Maternal care for unspecified type scar from previous cesarean delivery: Secondary | ICD-10-CM | POA: Insufficient documentation

## 2014-04-27 DIAGNOSIS — O99210 Obesity complicating pregnancy, unspecified trimester: Secondary | ICD-10-CM

## 2014-04-27 DIAGNOSIS — O3421 Maternal care for scar from previous cesarean delivery: Secondary | ICD-10-CM

## 2014-04-27 DIAGNOSIS — O09522 Supervision of elderly multigravida, second trimester: Secondary | ICD-10-CM

## 2014-05-02 ENCOUNTER — Ambulatory Visit (INDEPENDENT_AMBULATORY_CARE_PROVIDER_SITE_OTHER): Payer: Medicaid Other | Admitting: Advanced Practice Midwife

## 2014-05-02 VITALS — BP 114/69 | HR 94 | Wt 348.9 lb

## 2014-05-02 DIAGNOSIS — Z23 Encounter for immunization: Secondary | ICD-10-CM

## 2014-05-02 DIAGNOSIS — R319 Hematuria, unspecified: Secondary | ICD-10-CM

## 2014-05-02 DIAGNOSIS — O0990 Supervision of high risk pregnancy, unspecified, unspecified trimester: Secondary | ICD-10-CM

## 2014-05-02 DIAGNOSIS — O0993 Supervision of high risk pregnancy, unspecified, third trimester: Secondary | ICD-10-CM

## 2014-05-02 DIAGNOSIS — O10019 Pre-existing essential hypertension complicating pregnancy, unspecified trimester: Secondary | ICD-10-CM

## 2014-05-02 LAB — POCT URINALYSIS DIP (DEVICE)
GLUCOSE, UA: NEGATIVE mg/dL
Ketones, ur: NEGATIVE mg/dL
Nitrite: NEGATIVE
PH: 6 (ref 5.0–8.0)
Protein, ur: NEGATIVE mg/dL
SPECIFIC GRAVITY, URINE: 1.02 (ref 1.005–1.030)
UROBILINOGEN UA: 0.2 mg/dL (ref 0.0–1.0)

## 2014-05-02 NOTE — Progress Notes (Signed)
NST/AFI @ [facility] on 9/10.  IOL scheduled 9/18 @ 0730

## 2014-05-02 NOTE — Progress Notes (Signed)
RNST. IOL scheduled for 40 weeks. Continue antenatal testing. Flu vaccine.

## 2014-05-02 NOTE — Progress Notes (Signed)
Reports occasional, mild contractions.

## 2014-05-02 NOTE — Patient Instructions (Signed)
Labor Induction  Labor induction is when steps are taken to cause a pregnant woman to begin the labor process. Most women go into labor on their own between 37 weeks and 42 weeks of the pregnancy. When this does not happen or when there is a medical need, methods may be used to induce labor. Labor induction causes a pregnant woman's uterus to contract. It also causes the cervix to soften (ripen), open (dilate), and thin out (efface). Usually, labor is not induced before 39 weeks of the pregnancy unless there is a problem with the baby or mother.  Before inducing labor, your health care provider will consider a number of factors, including the following:  The medical condition of you and the baby.   How many weeks along you are.   The status of the baby's lung maturity.   The condition of the cervix.   The position of the baby.  WHAT ARE THE REASONS FOR LABOR INDUCTION? Labor may be induced for the following reasons:  The health of the baby or mother is at risk.   The pregnancy is overdue by 1 week or more.   The water breaks but labor does not start on its own.   The mother has a health condition or serious illness, such as high blood pressure, infection, placental abruption, or diabetes.  The amniotic fluid amounts are low around the baby.   The baby is distressed.  Convenience or wanting the baby to be born on a certain date is not a reason for inducing labor. WHAT METHODS ARE USED FOR LABOR INDUCTION? Several methods of labor induction may be used, such as:   Prostaglandin medicine. This medicine causes the cervix to dilate and ripen. The medicine will also start contractions. It can be taken by mouth or by inserting a suppository into the vagina.   Inserting a thin tube (catheter) with a balloon on the end into the vagina to dilate the cervix. Once inserted, the balloon is expanded with water, which causes the cervix to open.   Stripping the membranes. Your health  care provider separates amniotic sac tissue from the cervix, causing the cervix to be stretched and causing the release of a hormone called progesterone. This may cause the uterus to contract. It is often done during an office visit. You will be sent home to wait for the contractions to begin. You will then come in for an induction.   Breaking the water. Your health care provider makes a hole in the amniotic sac using a small instrument. Once the amniotic sac breaks, contractions should begin. This may still take hours to see an effect.   Medicine to trigger or strengthen contractions. This medicine is given through an IV access tube inserted into a vein in your arm.  All of the methods of induction, besides stripping the membranes, will be done in the hospital. Induction is done in the hospital so that you and the baby can be carefully monitored.  HOW LONG DOES IT TAKE FOR LABOR TO BE INDUCED? Some inductions can take up to 2-3 days. Depending on the cervix, it usually takes less time. It takes longer when you are induced early in the pregnancy or if this is your first pregnancy. If a mother is still pregnant and the induction has been going on for 2-3 days, either the mother will be sent home or a cesarean delivery will be needed. WHAT ARE THE RISKS ASSOCIATED WITH LABOR INDUCTION? Some of the risks of induction   include:   Changes in fetal heart rate, such as too high, too low, or erratic.   Fetal distress.   Chance of infection for the mother and baby.   Increased chance of having a cesarean delivery.   Breaking off (abruption) of the placenta from the uterus (rare).   Uterine rupture (very rare).  When induction is needed for medical reasons, the benefits of induction may outweigh the risks. WHAT ARE SOME REASONS FOR NOT INDUCING LABOR? Labor induction should not be done if:   It is shown that your baby does not tolerate labor.   You have had previous surgeries on your  uterus, such as a myomectomy or the removal of fibroids.   Your placenta lies very low in the uterus and blocks the opening of the cervix (placenta previa).   Your baby is not in a head-down position.   The umbilical cord drops down into the birth canal in front of the baby. This could cut off the baby's blood and oxygen supply.   You have had a previous cesarean delivery.   There are unusual circumstances, such as the baby being extremely premature.  Document Released: 12/31/2006 Document Revised: 04/13/2013 Document Reviewed: 03/10/2013 [employer] Patient Information 2015 [employer]. This information is not intended to replace advice given to you by your health care provider. Make sure you discuss any questions you have with your health care provider.  

## 2014-05-04 ENCOUNTER — Encounter (HOSPITAL_COMMUNITY): Payer: Self-pay

## 2014-05-04 ENCOUNTER — Ambulatory Visit (HOSPITAL_COMMUNITY)
Admission: RE | Admit: 2014-05-04 | Discharge: 2014-05-04 | Disposition: A | Discharge status: Home / Self Care | Payer: Medicaid Other | Source: Ambulatory Visit | Attending: Advanced Practice Midwife | Admitting: Advanced Practice Midwife

## 2014-05-04 ENCOUNTER — Other Ambulatory Visit: Payer: Self-pay | Admitting: Obstetrics & Gynecology

## 2014-05-04 ENCOUNTER — Ambulatory Visit (HOSPITAL_COMMUNITY)
Admission: RE | Admit: 2014-05-04 | Discharge: 2014-05-04 | Disposition: A | Discharge status: Home / Self Care | Payer: Medicaid Other | Source: Ambulatory Visit | Attending: Obstetrics & Gynecology | Admitting: Obstetrics & Gynecology

## 2014-05-04 ENCOUNTER — Other Ambulatory Visit (HOSPITAL_COMMUNITY): Payer: Self-pay | Admitting: Maternal and Fetal Medicine

## 2014-05-04 VITALS — BP 124/62 | HR 70 | Wt 355.0 lb

## 2014-05-04 DIAGNOSIS — O3421 Maternal care for scar from previous cesarean delivery: Secondary | ICD-10-CM

## 2014-05-04 DIAGNOSIS — O99210 Obesity complicating pregnancy, unspecified trimester: Secondary | ICD-10-CM

## 2014-05-04 DIAGNOSIS — O10019 Pre-existing essential hypertension complicating pregnancy, unspecified trimester: Secondary | ICD-10-CM

## 2014-05-04 DIAGNOSIS — O98413 Viral hepatitis complicating pregnancy, third trimester: Secondary | ICD-10-CM

## 2014-05-04 DIAGNOSIS — O358XX0 Maternal care for other (suspected) fetal abnormality and damage, not applicable or unspecified: Secondary | ICD-10-CM

## 2014-05-04 DIAGNOSIS — O09523 Supervision of elderly multigravida, third trimester: Secondary | ICD-10-CM

## 2014-05-04 DIAGNOSIS — E669 Obesity, unspecified: Secondary | ICD-10-CM

## 2014-05-04 DIAGNOSIS — B1910 Unspecified viral hepatitis B without hepatic coma: Secondary | ICD-10-CM

## 2014-05-04 LAB — CULTURE, OB URINE: Colony Count: 50000

## 2014-05-05 ENCOUNTER — Encounter (HOSPITAL_COMMUNITY): Payer: Medicaid Other | Admitting: Anesthesiology

## 2014-05-05 ENCOUNTER — Encounter (HOSPITAL_COMMUNITY)
Admission: AD | Disposition: A | Discharge status: Home / Self Care | Payer: Self-pay | Source: Ambulatory Visit | Attending: Obstetrics & Gynecology

## 2014-05-05 ENCOUNTER — Encounter (HOSPITAL_COMMUNITY): Payer: Self-pay | Admitting: *Deleted

## 2014-05-05 ENCOUNTER — Inpatient Hospital Stay (HOSPITAL_COMMUNITY): Payer: Medicaid Other | Admitting: Anesthesiology

## 2014-05-05 ENCOUNTER — Inpatient Hospital Stay (HOSPITAL_COMMUNITY)
Admission: AD | Admit: 2014-05-05 | Discharge: 2014-05-07 | DRG: 767 | Disposition: A | Discharge status: Home / Self Care | Payer: Medicaid Other | Source: Ambulatory Visit | Attending: Obstetrics & Gynecology | Admitting: Obstetrics & Gynecology

## 2014-05-05 DIAGNOSIS — O99214 Obesity complicating childbirth: Secondary | ICD-10-CM

## 2014-05-05 DIAGNOSIS — O1002 Pre-existing essential hypertension complicating childbirth: Secondary | ICD-10-CM

## 2014-05-05 DIAGNOSIS — D649 Anemia, unspecified: Secondary | ICD-10-CM | POA: Diagnosis present

## 2014-05-05 DIAGNOSIS — O99892 Other specified diseases and conditions complicating childbirth: Secondary | ICD-10-CM | POA: Diagnosis present

## 2014-05-05 DIAGNOSIS — O34219 Maternal care for unspecified type scar from previous cesarean delivery: Secondary | ICD-10-CM | POA: Diagnosis not present

## 2014-05-05 DIAGNOSIS — E669 Obesity, unspecified: Secondary | ICD-10-CM | POA: Diagnosis present

## 2014-05-05 DIAGNOSIS — Z6841 Body Mass Index (BMI) 40.0 and over, adult: Secondary | ICD-10-CM | POA: Diagnosis not present

## 2014-05-05 DIAGNOSIS — O9989 Other specified diseases and conditions complicating pregnancy, childbirth and the puerperium: Secondary | ICD-10-CM

## 2014-05-05 DIAGNOSIS — O0993 Supervision of high risk pregnancy, unspecified, third trimester: Secondary | ICD-10-CM

## 2014-05-05 DIAGNOSIS — Z302 Encounter for sterilization: Secondary | ICD-10-CM

## 2014-05-05 DIAGNOSIS — Z87891 Personal history of nicotine dependence: Secondary | ICD-10-CM | POA: Diagnosis not present

## 2014-05-05 DIAGNOSIS — Z22330 Carrier of Group B streptococcus: Secondary | ICD-10-CM | POA: Diagnosis not present

## 2014-05-05 DIAGNOSIS — O98413 Viral hepatitis complicating pregnancy, third trimester: Secondary | ICD-10-CM

## 2014-05-05 DIAGNOSIS — O9902 Anemia complicating childbirth: Secondary | ICD-10-CM | POA: Diagnosis present

## 2014-05-05 DIAGNOSIS — O4290 Premature rupture of membranes, unspecified as to length of time between rupture and onset of labor, unspecified weeks of gestation: Secondary | ICD-10-CM | POA: Diagnosis present

## 2014-05-05 DIAGNOSIS — O09529 Supervision of elderly multigravida, unspecified trimester: Secondary | ICD-10-CM | POA: Diagnosis not present

## 2014-05-05 DIAGNOSIS — O10019 Pre-existing essential hypertension complicating pregnancy, unspecified trimester: Secondary | ICD-10-CM

## 2014-05-05 DIAGNOSIS — O98519 Other viral diseases complicating pregnancy, unspecified trimester: Secondary | ICD-10-CM

## 2014-05-05 DIAGNOSIS — B1910 Unspecified viral hepatitis B without hepatic coma: Secondary | ICD-10-CM

## 2014-05-05 DIAGNOSIS — O99891 Other specified diseases and conditions complicating pregnancy: Secondary | ICD-10-CM | POA: Diagnosis present

## 2014-05-05 HISTORY — PX: TUBAL LIGATION: SHX77

## 2014-05-05 LAB — CBC
HCT: 32.6 % — ABNORMAL LOW (ref 36.0–46.0)
HEMOGLOBIN: 10.3 g/dL — AB (ref 12.0–15.0)
MCH: 26 pg (ref 26.0–34.0)
MCHC: 31.6 g/dL (ref 30.0–36.0)
MCV: 82.3 fL (ref 78.0–100.0)
PLATELETS: 253 10*3/uL (ref 150–400)
RBC: 3.96 MIL/uL (ref 3.87–5.11)
RDW: 16.1 % — ABNORMAL HIGH (ref 11.5–15.5)
WBC: 6.4 10*3/uL (ref 4.0–10.5)

## 2014-05-05 LAB — TYPE AND SCREEN
ABO/RH(D): B POS
Antibody Screen: NEGATIVE

## 2014-05-05 LAB — RPR

## 2014-05-05 SURGERY — LIGATION, FALLOPIAN TUBE, POSTPARTUM
Anesthesia: Spinal | Site: Abdomen | Laterality: Bilateral

## 2014-05-05 MED ORDER — PHENYLEPHRINE 40 MCG/ML (10ML) SYRINGE FOR IV PUSH (FOR BLOOD PRESSURE SUPPORT)
PREFILLED_SYRINGE | INTRAVENOUS | Status: AC
  Filled 2014-05-05: qty 5

## 2014-05-05 MED ORDER — LACTATED RINGERS IV SOLN
INTRAVENOUS | Status: DC
  Administered 2014-05-05 (×2): via INTRAVENOUS

## 2014-05-05 MED ORDER — OXYCODONE-ACETAMINOPHEN 5-325 MG PO TABS: 2.0000 | ORAL_TABLET | ORAL | Status: DC | PRN

## 2014-05-05 MED ORDER — PENICILLIN G POTASSIUM 5000000 UNITS IJ SOLR
2.5000 10*6.[IU] | INTRAMUSCULAR | Status: DC
  Filled 2014-05-05 (×2): qty 5

## 2014-05-05 MED ORDER — DIPHENHYDRAMINE HCL 25 MG PO CAPS: 25.0000 mg | ORAL_CAPSULE | Freq: Four times a day (QID) | ORAL | Status: DC | PRN

## 2014-05-05 MED ORDER — FENTANYL CITRATE 0.05 MG/ML IJ SOLN
25.0000 ug | INTRAMUSCULAR | Status: DC | PRN
  Administered 2014-05-05: 50 ug via INTRAVENOUS

## 2014-05-05 MED ORDER — METOCLOPRAMIDE HCL 10 MG PO TABS
10.0000 mg | ORAL_TABLET | Freq: Once | ORAL | Status: AC
  Administered 2014-05-05: 10 mg via ORAL
  Filled 2014-05-05: qty 1

## 2014-05-05 MED ORDER — LACTATED RINGERS IV SOLN: 500.0000 mL | INTRAVENOUS | Status: DC | PRN

## 2014-05-05 MED ORDER — KETOROLAC TROMETHAMINE 30 MG/ML IJ SOLN
INTRAMUSCULAR | Status: AC
  Filled 2014-05-05: qty 1

## 2014-05-05 MED ORDER — BUPIVACAINE HCL (PF) 0.25 % IJ SOLN
INTRAMUSCULAR | Status: DC | PRN
  Administered 2014-05-05 (×2): 5 mL

## 2014-05-05 MED ORDER — PHENYLEPHRINE 8 MG IN D5W 100 ML (0.08MG/ML) PREMIX OPTIME
INJECTION | INTRAVENOUS | Status: DC | PRN
  Administered 2014-05-05: 30 ug/min via INTRAVENOUS

## 2014-05-05 MED ORDER — FENTANYL CITRATE 0.05 MG/ML IJ SOLN
INTRAMUSCULAR | Status: AC
  Filled 2014-05-05: qty 2

## 2014-05-05 MED ORDER — OXYTOCIN BOLUS FROM INFUSION: 500.0000 mL | INTRAVENOUS | Status: DC

## 2014-05-05 MED ORDER — OXYCODONE-ACETAMINOPHEN 5-325 MG PO TABS: 1.0000 | ORAL_TABLET | ORAL | Status: DC | PRN

## 2014-05-05 MED ORDER — PRENATAL MULTIVITAMIN CH
1.0000 | ORAL_TABLET | Freq: Every day | ORAL | Status: DC
  Administered 2014-05-06 – 2014-05-07 (×2): 1 via ORAL
  Filled 2014-05-05 (×2): qty 1

## 2014-05-05 MED ORDER — BENZOCAINE-MENTHOL 20-0.5 % EX AERO
1.0000 | INHALATION_SPRAY | CUTANEOUS | Status: DC | PRN
Start: 2014-05-05 — End: 2014-05-07

## 2014-05-05 MED ORDER — SENNOSIDES-DOCUSATE SODIUM 8.6-50 MG PO TABS
2.0000 | ORAL_TABLET | ORAL | Status: DC
  Administered 2014-05-06: 2 via ORAL
  Filled 2014-05-05: qty 2

## 2014-05-05 MED ORDER — ZOLPIDEM TARTRATE 5 MG PO TABS: 5.0000 mg | ORAL_TABLET | Freq: Every evening | ORAL | Status: DC | PRN

## 2014-05-05 MED ORDER — WITCH HAZEL-GLYCERIN EX PADS
1.0000 "application " | MEDICATED_PAD | CUTANEOUS | Status: DC | PRN
  Administered 2014-05-06: 1 via TOPICAL

## 2014-05-05 MED ORDER — TETANUS-DIPHTH-ACELL PERTUSSIS 5-2.5-18.5 LF-MCG/0.5 IM SUSP: 0.5000 mL | Freq: Once | INTRAMUSCULAR | Status: DC

## 2014-05-05 MED ORDER — PROMETHAZINE HCL 25 MG/ML IJ SOLN
6.2500 mg | INTRAMUSCULAR | Status: DC | PRN
Start: 2014-05-05 — End: 2014-05-05

## 2014-05-05 MED ORDER — FENTANYL CITRATE 0.05 MG/ML IJ SOLN
INTRAMUSCULAR | Status: AC
  Administered 2014-05-05: 50 ug via INTRAVENOUS
  Filled 2014-05-05: qty 2

## 2014-05-05 MED ORDER — FENTANYL CITRATE 0.05 MG/ML IJ SOLN
100.0000 ug | INTRAMUSCULAR | Status: DC | PRN
  Filled 2014-05-05: qty 2

## 2014-05-05 MED ORDER — SODIUM CHLORIDE 0.9 % IR SOLN
Status: DC | PRN
  Administered 2014-05-05: 1000 mL

## 2014-05-05 MED ORDER — BUPIVACAINE IN DEXTROSE 0.75-8.25 % IT SOLN
INTRATHECAL | Status: DC | PRN
  Administered 2014-05-05: 1.5 mL via INTRATHECAL

## 2014-05-05 MED ORDER — DEXTROSE 5 % IV SOLN
5.0000 10*6.[IU] | Freq: Once | INTRAVENOUS | Status: AC
  Administered 2014-05-05: 5 10*6.[IU] via INTRAVENOUS
  Filled 2014-05-05: qty 5

## 2014-05-05 MED ORDER — LACTATED RINGERS IV SOLN: INTRAVENOUS | Status: DC

## 2014-05-05 MED ORDER — TERBUTALINE SULFATE 1 MG/ML IJ SOLN: 0.2500 mg | Freq: Once | INTRAMUSCULAR | Status: DC | PRN

## 2014-05-05 MED ORDER — ONDANSETRON HCL 4 MG/2ML IJ SOLN: 4.0000 mg | INTRAMUSCULAR | Status: DC | PRN

## 2014-05-05 MED ORDER — MIDAZOLAM HCL 2 MG/2ML IJ SOLN: 0.5000 mg | Freq: Once | INTRAMUSCULAR | Status: DC | PRN

## 2014-05-05 MED ORDER — ONDANSETRON HCL 4 MG/2ML IJ SOLN: 4.0000 mg | Freq: Four times a day (QID) | INTRAMUSCULAR | Status: DC | PRN

## 2014-05-05 MED ORDER — DEXTROSE 5 % IV SOLN
2.5000 10*6.[IU] | INTRAVENOUS | Status: DC
  Administered 2014-05-05: 2.5 10*6.[IU] via INTRAVENOUS
  Filled 2014-05-05 (×6): qty 2.5

## 2014-05-05 MED ORDER — OXYTOCIN 40 UNITS IN LACTATED RINGERS INFUSION - SIMPLE MED
1.0000 m[IU]/min | INTRAVENOUS | Status: DC
  Administered 2014-05-05: 2 m[IU]/min via INTRAVENOUS
  Filled 2014-05-05: qty 1000

## 2014-05-05 MED ORDER — MIDAZOLAM HCL 2 MG/2ML IJ SOLN
INTRAMUSCULAR | Status: AC
  Filled 2014-05-05: qty 2

## 2014-05-05 MED ORDER — PHENYLEPHRINE HCL 10 MG/ML IJ SOLN
INTRAMUSCULAR | Status: AC
  Filled 2014-05-05: qty 1

## 2014-05-05 MED ORDER — OXYTOCIN 40 UNITS IN LACTATED RINGERS INFUSION - SIMPLE MED: 62.5000 mL/h | INTRAVENOUS | Status: DC

## 2014-05-05 MED ORDER — FENTANYL CITRATE 0.05 MG/ML IJ SOLN
INTRAMUSCULAR | Status: DC | PRN
  Administered 2014-05-05 (×3): 50 ug via INTRAVENOUS

## 2014-05-05 MED ORDER — LANOLIN HYDROUS EX OINT: TOPICAL_OINTMENT | CUTANEOUS | Status: DC | PRN

## 2014-05-05 MED ORDER — DIBUCAINE 1 % RE OINT
1.0000 "application " | TOPICAL_OINTMENT | RECTAL | Status: DC | PRN
  Administered 2014-05-06: 1 via RECTAL
  Filled 2014-05-05: qty 28

## 2014-05-05 MED ORDER — MIDAZOLAM HCL 2 MG/2ML IJ SOLN
INTRAMUSCULAR | Status: DC | PRN
  Administered 2014-05-05 (×2): 1 mg via INTRAVENOUS

## 2014-05-05 MED ORDER — ONDANSETRON HCL 4 MG/2ML IJ SOLN
INTRAMUSCULAR | Status: AC
  Filled 2014-05-05: qty 2

## 2014-05-05 MED ORDER — MEPERIDINE HCL 25 MG/ML IJ SOLN: 6.2500 mg | INTRAMUSCULAR | Status: DC | PRN

## 2014-05-05 MED ORDER — KETOROLAC TROMETHAMINE 30 MG/ML IJ SOLN
INTRAMUSCULAR | Status: AC
  Administered 2014-05-05: 30 mg via INTRAVENOUS
  Filled 2014-05-05: qty 1

## 2014-05-05 MED ORDER — IBUPROFEN 600 MG PO TABS
600.0000 mg | ORAL_TABLET | Freq: Four times a day (QID) | ORAL | Status: DC
  Administered 2014-05-06 – 2014-05-07 (×7): 600 mg via ORAL
  Filled 2014-05-05 (×7): qty 1

## 2014-05-05 MED ORDER — LACTATED RINGERS IV SOLN
INTRAVENOUS | Status: DC | PRN
  Administered 2014-05-05 (×2): via INTRAVENOUS

## 2014-05-05 MED ORDER — SIMETHICONE 80 MG PO CHEW
80.0000 mg | CHEWABLE_TABLET | ORAL | Status: DC | PRN
  Administered 2014-05-06: 80 mg via ORAL

## 2014-05-05 MED ORDER — LIDOCAINE HCL (PF) 1 % IJ SOLN: 30.0000 mL | INTRAMUSCULAR | Status: DC | PRN

## 2014-05-05 MED ORDER — PENICILLIN G POTASSIUM 5000000 UNITS IJ SOLR
5.0000 10*6.[IU] | Freq: Once | INTRAMUSCULAR | Status: DC
Start: 2014-05-05 — End: 2014-05-05
  Filled 2014-05-05: qty 5

## 2014-05-05 MED ORDER — KETOROLAC TROMETHAMINE 30 MG/ML IJ SOLN
15.0000 mg | Freq: Once | INTRAMUSCULAR | Status: AC | PRN
  Administered 2014-05-05: 30 mg via INTRAVENOUS

## 2014-05-05 MED ORDER — FAMOTIDINE 20 MG PO TABS
40.0000 mg | ORAL_TABLET | Freq: Once | ORAL | Status: AC
  Administered 2014-05-05: 40 mg via ORAL
  Filled 2014-05-05: qty 2

## 2014-05-05 MED ORDER — CITRIC ACID-SODIUM CITRATE 334-500 MG/5ML PO SOLN
30.0000 mL | ORAL | Status: DC | PRN
  Filled 2014-05-05: qty 15

## 2014-05-05 MED ORDER — FAMOTIDINE IN NACL 20-0.9 MG/50ML-% IV SOLN: 20.0000 mg | Freq: Once | INTRAVENOUS | Status: DC

## 2014-05-05 MED ORDER — ONDANSETRON HCL 4 MG PO TABS: 4.0000 mg | ORAL_TABLET | ORAL | Status: DC | PRN

## 2014-05-05 MED ORDER — BUPIVACAINE HCL (PF) 0.25 % IJ SOLN
INTRAMUSCULAR | Status: AC
Start: 2014-05-05 — End: 2014-05-05
  Filled 2014-05-05: qty 30

## 2014-05-05 MED ORDER — ACETAMINOPHEN 325 MG PO TABS: 650.0000 mg | ORAL_TABLET | ORAL | Status: DC | PRN

## 2014-05-05 SURGICAL SUPPLY — 34 items
ADH SKN CLS APL DERMABOND .7 (GAUZE/BANDAGES/DRESSINGS) ×1
CHLORAPREP W/TINT 26ML (MISCELLANEOUS) ×2 IMPLANT
CLIP FILSHIE TUBAL LIGA STRL (Clip) ×2 IMPLANT
CLOTH BEACON ORANGE TIMEOUT ST (SAFETY) ×3 IMPLANT
CONTAINER PREFILL 10% NBF 15ML (MISCELLANEOUS) ×6 IMPLANT
DERMABOND ADVANCED (GAUZE/BANDAGES/DRESSINGS) ×2
DERMABOND ADVANCED .7 DNX12 (GAUZE/BANDAGES/DRESSINGS) ×1 IMPLANT
DRSG OPSITE POSTOP 3X4 (GAUZE/BANDAGES/DRESSINGS) ×3 IMPLANT
ELECT REM PT RETURN 9FT ADLT (ELECTROSURGICAL) ×3
ELECTRODE REM PT RTRN 9FT ADLT (ELECTROSURGICAL) ×1 IMPLANT
GLOVE BIO SURGEON STRL SZ7 (GLOVE) ×2 IMPLANT
GLOVE BIOGEL PI IND STRL 8 (GLOVE) ×1 IMPLANT
GLOVE BIOGEL PI INDICATOR 8 (GLOVE) ×2
GLOVE ECLIPSE 7.0 STRL STRAW (GLOVE) ×2 IMPLANT
GLOVE ECLIPSE 8.0 STRL XLNG CF (GLOVE) ×3 IMPLANT
GLOVE INDICATOR 7.5 STRL GRN (GLOVE) ×2 IMPLANT
GOWN STRL REUS W/TWL LRG LVL3 (GOWN DISPOSABLE) ×6 IMPLANT
NDL HYPO 25X1 1.5 SAFETY (NEEDLE) ×1 IMPLANT
NEEDLE HYPO 25X1 1.5 SAFETY (NEEDLE) ×3 IMPLANT
NS IRRIG 1000ML POUR BTL (IV SOLUTION) ×3 IMPLANT
PACK ABDOMINAL MINOR (CUSTOM PROCEDURE TRAY) ×3 IMPLANT
PENCIL BUTTON HOLSTER BLD 10FT (ELECTRODE) ×3 IMPLANT
SPONGE LAP 4X18 X RAY DECT (DISPOSABLE) IMPLANT
SUT PLAIN 2 0 (SUTURE)
SUT PLAIN ABS 2-0 CT1 27XMFL (SUTURE) ×2 IMPLANT
SUT VIC AB 0 CT1 27 (SUTURE) ×3
SUT VIC AB 0 CT1 27XBRD ANBCTR (SUTURE) ×1 IMPLANT
SUT VIC AB 4-0 KS 27 (SUTURE) ×1 IMPLANT
SUT VICRYL 4-0 PS2 18IN ABS (SUTURE) ×2 IMPLANT
SYRINGE CONTROL L 12CC (SYRINGE) ×3 IMPLANT
SYRINGE CONTROL LL 12CC (SYRINGE) ×1 IMPLANT
TOWEL OR 17X24 6PK STRL BLUE (TOWEL DISPOSABLE) ×6 IMPLANT
TRAY FOLEY CATH 14FR (SET/KITS/TRAYS/PACK) ×3 IMPLANT
WATER STERILE IRR 1000ML POUR (IV SOLUTION) ×3 IMPLANT

## 2014-05-05 NOTE — H&P (Addendum)
Ann Woods is a 38 y.o. G3P2002 at [facility] of 39.0 by 83 wk sono   female presenting for leakage of fluid. Onset at 0001 while she was taking a shower. Reports no contractions or vaginal bleeding. Reports good fetal movement. Has no pain at this time, but has noticed that contractions have started Maternal Medical History:  Reason for admission: Nausea.    OB History   Grav Para Term Preterm Abortions TAB SAB Ect Mult Living   3 2 2  0 0 0 0 0 0 2     Past Medical History  Diagnosis Date  . Seasonal allergies   . Obesity   . Hypertension     G2 - preeclampsia   Past Surgical History  Procedure Laterality Date  . Hip surgery    . Cesarean section     Family History: family history is not on file. Social History:  reports that she quit smoking about 20 years ago. She has never used smokeless tobacco. She reports that she does not drink alcohol or use illicit drugs.   Review of Systems  Constitutional: Negative for fever and chills.  Gastrointestinal: Negative for nausea and abdominal pain.      Blood pressure 118/69, pulse 86, temperature 97.9 F (36.6 C), temperature source Oral, resp. rate 20, height 5\' 2"  (1.575 m), weight 161.084 kg (355 lb 2 oz), last menstrual period 08/28/2013. Maternal Exam:  Uterine Assessment: No contractions  Abdomen: Surgical scars: low transverse.   Fetal presentation: vertex  Introitus: Normal vulva. Normal vagina.  Ferning test: positive.      Fetal Exam Fetal Monitor Review: Baseline rate: 140.  Variability: moderate (6-25 bpm).   Pattern: accelerations present and no decelerations.    Fetal State Assessment: Category I - tracings are normal.     Physical Exam  Constitutional: She appears well-developed. No distress.  HENT:  Head: Normocephalic and atraumatic.  Eyes: Conjunctivae and EOM are normal.  Neck: Normal range of motion.  Cardiovascular: Normal rate, regular rhythm and normal heart sounds.   Respiratory: Effort  normal and breath sounds normal. No respiratory distress.  GI: Soft. Bowel sounds are normal. There is no tenderness.  Genitourinary: Vagina normal.  Musculoskeletal: Normal range of motion.  Neurological: She is alert. She has normal reflexes.  Skin: Skin is warm and dry.      Clinic  HR Clinic  Dating  16 wk ultrasound  Genetic Screen  NIPS: Panorama  Anatomic [place]  16 wks, limited > rescan at 19 wks  GTT Early:               Third trimester: 37  TDaP vaccine 02/16/14  Flu vaccine  05/02/2014  GBS  positive  Contraception Considering BTS, papers signed 04/03/2014  Baby Food Breast ->bottle  Circumcision Yes  Pediatrician [facility]  Support Person Family    Prenatal labs: ABO, Rh: B/Positive/-- (03/30 0000) Antibody:   Rubella: Immune (03/30 0000) RPR: NON REAC (06/25 1207)  HBsAg: Positive (03/31 0000)  HIV: NONREACTIVE (06/25 1207)  GBS: Positive (08/24 0000)   Assessment/Plan: Ann Woods is a 38 y.o. G3P2002 at [facility] of 39.0 by 92 wk sono   female presenting for leakage of fluid  1. IUP at 39.0. -PROM confirmed by positive ferning.  -Will admit to labor and delivery for expectant management. -Cervical exam deferred at this time given pt comfort and request and no contractions noted on the monitor.  2. FWB. -Category I tracing  3. ID:  Start PCN as GBS + and PROM  4.  MOF: breast/bottle.  5. MOC: PP BTL  6. Circumcision: as outpatient  Pt discussed with and seen by and examined by Joseph Berkshire- Dishmon, CNM.   Carman Ching, MD, PGY3 05/05/2014, 6:40 AM   I have seen and examined this patient and agree the above assessment. She was given option of expectant vs active management:  She feels as if her contractions are getting stronger and more frequent, so wants to wait and see what happens.  Walking and nipple stimulation encouraged.  Pt aware that active management may become necessary d/t PROM.  CRESENZO-DISHMAN,Ann Woods 05/05/2014 6:54  AM

## 2014-05-05 NOTE — Op Note (Signed)
Ann Woods  05/05/2014  PREOPERATIVE DIAGNOSIS:  Multiparity, undesired fertility  POSTOPERATIVE DIAGNOSIS:  Multiparity, undesired fertility  PROCEDURE:  Postpartum Bilateral Tubal Sterilization using Filshie Clips   ANESTHESIA:  Spinal and local analgesia using 8.34% Marcaine  COMPLICATIONS:  None immediate.  ESTIMATED BLOOD LOSS: 25 ml.  INDICATIONS: 38 y.o. [id number]  with undesired fertility,status post vaginal delivery, desires permanent sterilization.  Other reversible forms of contraception were discussed with patient; she declines all other modalities. Risks of procedure discussed with patient including but not limited to: risk of regret, permanence of method, bleeding, infection, injury to surrounding organs and need for additional procedures.  Failure risk of 0.5-1% with increased risk of ectopic gestation if pregnancy occurs was also discussed with patient.     FINDINGS:  Normal uterus, tubes, and ovaries.  PROCEDURE DETAILS: The patient was taken to the operating room where her spinal anesthesia was dosed up to surgical level and found to be adequate.  She was then placed in a supine position and prepped and draped in the usual sterile fashion.  After an adequate timeout was performed, attention was turned to the patient's abdomen where a small transverse skin incision was made under the umbilical fold. The incision was taken down to the layer of fascia using the scalpel, and fascia was incised, and extended bilaterally. The peritoneum was entered in a sharp fashion. The patient was placed in Trendelenburg.  A moist lap pad was used to move omentum and bowel away until the right fallopian tube was identified and grasped with a Babcock clamp, and followed out to the fimbriated end.  A Filshie clip was placed on the left fallopian tube about 2 cm from the cornu.  A similar process was carried out on the left side allowing for bilateral tubal sterilization. A small tear in the meso  was noted on this side and hemostasis was achieved with electrocautery.  Good hemostasis was noted overall.  Local analgesia was injected into both Filshie application sites.The instruments were then removed from the patient's abdomen and the fascial incision was repaired with 0 Vicryl, one interrupted was placed in the subcutaneous tissue and the skin was closed with a 4-0 Vicryl subcuticular stitch. The patient tolerated the procedure well.  Sponge, lap, and needle counts were correct times two.  The patient was then taken to the recovery room awake, extubated and in stable condition.  [name] MD 05/05/2014 6:47 PM

## 2014-05-05 NOTE — Anesthesia Procedure Notes (Signed)
Spinal  Patient location during procedure: OR Start time: 05/05/2014 5:46 PM Staffing Anesthesiologist: Rudean Curt Performed by: anesthesiologist  Preanesthetic Checklist Completed: patient identified, site marked, surgical consent, pre-op evaluation, timeout performed, IV checked, risks and benefits discussed and monitors and equipment checked Spinal Block Patient position: sitting Prep: DuraPrep Patient monitoring: heart rate, cardiac monitor, continuous pulse ox and blood pressure Approach: midline Location: L3-4 Injection technique: single-shot Needle Needle type: Sprotte  Needle gauge: 24 G Needle length: 9 cm Assessment Sensory level: T4 Additional Notes Patient identified.  Risk benefits discussed including failed block, incomplete pain control, headache, nerve damage, paralysis, blood pressure changes, nausea, vomiting, reactions to medication both toxic or allergic, and postpartum back pain.  Patient expressed understanding and wished to proceed.  All questions were answered.  Sterile technique used throughout procedure.  CSF was clear.  No parasthesia or other complications.  Please see nursing notes for vital signs.

## 2014-05-05 NOTE — Progress Notes (Signed)
Ann Woods is a 38 y.o. G3P2002 at [redacted]w[redacted]d   Subjective: Ctx very rare and not uncomfortable  Objective: BP 131/74  Pulse 78  Temp(Src) 97.9 F (36.6 C) (Oral)  Resp 18  Ht 5\' 2"  (1.575 m)  Wt 161.084 kg (355 lb 2 oz)  BMI 64.94 kg/m2  LMP 08/28/2013      FHT:  FHR: 135-145 bpm, variability: moderate,  accelerations:  Present,  decelerations:  Absent UC:   Rare, no pattern SVE:  4/50/-3/post per Dr Elly Modena (vtx verified by [place]) Labs: Lab Results  Component Value Date   WBC 6.4 05/05/2014   HGB 10.3* 05/05/2014   HCT 32.6* 05/05/2014   MCV 82.3 05/05/2014   PLT 253 05/05/2014    Assessment / Plan: IUP @ 39.0 PROM VBAC GBS pos  Will begin Pitocin since cx is favorable Pt still elects VBAC and PPBTL  Ramon Zanders CNM 05/05/2014, 10:30 AM

## 2014-05-05 NOTE — MAU Note (Signed)
PT SAYS SROM  AT [place]- CLEAR FLUID.   GETS [place]  LAST Monday-  CLOSED.[name]  DENIES HSV AND MRSA.  GBS- POSITIVE

## 2014-05-05 NOTE — Anesthesia Postprocedure Evaluation (Signed)
  Anesthesia Post-op Note  Patient: Ann Woods  Procedure(s) Performed: Procedure(s): POST PARTUM TUBAL LIGATION (Bilateral)  Patient Location: PACU  Anesthesia Type:Spinal  Level of Consciousness: awake and alert   Airway and Oxygen Therapy: Patient Spontanous Breathing  Post-op Pain: mild  Post-op Assessment: Post-op Vital signs reviewed, Patient's Cardiovascular Status Stable, Respiratory Function Stable, Patent Airway, No signs of Nausea or vomiting and Pain level controlled  Post-op Vital Signs: Reviewed and stable  Last Vitals:  Filed Vitals:   05/05/14 1930  BP: 110/58  Pulse: 60  Temp:   Resp: 20    Complications: No apparent anesthesia complications

## 2014-05-05 NOTE — Transfer of Care (Signed)
Immediate Anesthesia Transfer of Care Note  Patient: Ann Woods  Procedure(s) Performed: Procedure(s): POST PARTUM TUBAL LIGATION (Bilateral)  Patient Location: PACU  Anesthesia Type:Epidural  Level of Consciousness: awake, alert  and oriented  Airway & Oxygen Therapy: Patient Spontanous Breathing  Post-op Assessment: Report given to PACU RN and Post -op Vital signs reviewed and stable  Post vital signs: Reviewed and stable  Complications: No apparent anesthesia complications

## 2014-05-05 NOTE — Progress Notes (Signed)
Patient ID: Ann Woods, female   DOB: 03-17-1976, 38 y.o.   MRN: [id number] 38 y.o. yo G3P2002  with undesired fertility,status post vaginal delivery, desires permanent sterilization. Risks and benefits of procedure discussed with patient including permanence of method, bleeding, infection, injury to surrounding organs and need for additional procedures. Risk failure of 0.5-1% with increased risk of ectopic gestation if pregnancy occurs was also discussed with patient.

## 2014-05-05 NOTE — Anesthesia Preprocedure Evaluation (Signed)
Anesthesia Evaluation  Patient identified by MRN, date of birth, ID band Patient awake    Reviewed: Allergy & Precautions, H&P , Patient's Chart, lab work & pertinent test results  Airway Mallampati: III      Dental   Pulmonary former smoker,  breath sounds clear to auscultation        Cardiovascular Exercise Tolerance: Good hypertension, Rhythm:regular Rate:Normal     Neuro/Psych negative psych ROS   GI/Hepatic (+) Hepatitis -  Endo/Other  Morbid obesity  Renal/GU      Musculoskeletal   Abdominal   Peds  Hematology  (+) anemia ,   Anesthesia Other Findings   Reproductive/Obstetrics                           Anesthesia Physical Anesthesia Plan  ASA: III  Anesthesia Plan: Spinal   Post-op Pain Management:    Induction:   Airway Management Planned:   Additional Equipment:   Intra-op Plan:   Post-operative Plan:   Informed Consent: I have reviewed the patients History and Physical, chart, labs and discussed the procedure including the risks, benefits and alternatives for the proposed anesthesia with the patient or authorized representative who has indicated his/her understanding and acceptance.     Plan Discussed with: Anesthesiologist, CRNA and Surgeon  Anesthesia Plan Comments:         Anesthesia Quick Evaluation

## 2014-05-06 NOTE — Anesthesia Postprocedure Evaluation (Signed)
Anesthesia Post Note  Patient: Ann Woods  Procedure(s) Performed: Procedure(s) (LRB): POST PARTUM TUBAL LIGATION (Bilateral)  Anesthesia type: Epidural  Patient location: Mother/Baby  Post pain: Pain level controlled  Post assessment: Post-op Vital signs reviewed  Last Vitals:  Filed Vitals:   05/06/14 0620  BP: 116/61  Pulse: 68  Temp: 36.4 C  Resp: 20    Post vital signs: Reviewed  Level of consciousness:alert  Complications: No apparent anesthesia complications Complications:

## 2014-05-06 NOTE — Addendum Note (Signed)
Addendum created 05/06/14 0801 by Lenox Ponds, CRNA   Modules edited: Charges VN, Notes Section   Notes Section:  File: [id number]

## 2014-05-06 NOTE — Progress Notes (Signed)
Post Partum Day #1/POD#1 s/p BTL Subjective: no complaints, up ad lib and tolerating PO; breast and bottlefeeding  Objective: Blood pressure 116/61, pulse 68, temperature 97.5 F (36.4 C), temperature source Oral, resp. rate 20, height 5\' 2"  (1.575 m), weight 161.084 kg (355 lb 2 oz), last menstrual period 08/28/2013, SpO2 100.00%, unknown if currently breastfeeding.  Physical Exam:  General: alert, cooperative and no distress Heart: RRR Lungs: nl effort Lochia: appropriate Uterine Fundus: firm Incision: healing well; honeycomb dsg intact on BTL inc DVT Evaluation: No evidence of DVT seen on physical exam.   Recent Labs  05/05/14 0532  HGB 10.3*  HCT 32.6*    Assessment/Plan: Plan for discharge tomorrow   LOS: 1 day   Elenora Hawbaker, [place] 05/06/2014, 8:58 AM

## 2014-05-06 NOTE — Lactation Note (Signed)
This note was copied from the chart of [place]. Lactation Note:  Initial visit done.  Breastfeeding consultation services and support information given to patient.Mom states she tried to breastfeed her last baby but her milk supply was not good.  Baby is currently on breast with a good latch and active sucking.  Instructed on feeding cues and to feed baby with any cue.  Mom shown how to massage breast during feeding to increase milk flow and intake.  Encouraged to call for concerns/assist prn.

## 2014-05-07 MED ORDER — OXYCODONE-ACETAMINOPHEN 5-325 MG PO TABS: 1.0000 | ORAL_TABLET | ORAL | Status: DC | PRN

## 2014-05-07 MED ORDER — IBUPROFEN 600 MG PO TABS: 600.0000 mg | ORAL_TABLET | Freq: Four times a day (QID) | ORAL | Status: DC

## 2014-05-07 NOTE — Discharge Instructions (Signed)

## 2014-05-07 NOTE — Discharge Summary (Signed)
Obstetric Discharge Summary Reason for Admission: rupture of membranes Prenatal Procedures: NST Intrapartum Procedures: spontaneous vaginal delivery VBAC, successful Postpartum Procedures: P.P. tubal ligation Complications-Operative and Postpartum: none Hemoglobin  Date Value Ref Range Status  05/05/2014 10.3* 12.0 - 15.0 g/dL Final  11/21/2013 10.4   Final     HCT  Date Value Ref Range Status  05/05/2014 32.6* 36.0 - 46.0 % Final  11/21/2013 33   Final  Hospital Course: Ann Woods is a 38 y.o. G3P2002 at [facility] of 39.0 by 28 wk sono female presenting for leakage of fluid. Onset at 0001 while she was taking a shower. Reports no contractions or vaginal bleeding. Reports good fetal movement. Has no pain at this time, but has noticed that contractions have started  Delivery Note  Pt became complete and after a brief time of pushing at 1:50 PM a viable female was delivered via VBAC, Spontaneous (Presentation: ; Occiput Anterior). APGAR: 8, 9; weight: pending. Cord clamped and cut by FOB. Hospital cord blood sample collected.  Placenta status: Intact, Spontaneous. Cord: 3 vessels with the following complications: None.  Anesthesia: None  Episiotomy: None  Lacerations: None  Est. Blood Loss (mL): 250  Mom to postpartum. Baby to Couplet care / Skin to Skin.  Stefano Gaul PA-S present and assisted.  Serita Grammes CNM  05/05/2014, 2:53 PM      Ann Woods  05/05/2014  PREOPERATIVE DIAGNOSIS: Multiparity, undesired fertility  POSTOPERATIVE DIAGNOSIS: Multiparity, undesired fertility  PROCEDURE: Postpartum Bilateral Tubal Sterilization using Filshie Clips  ANESTHESIA: Spinal and local analgesia using 1.15% Marcaine  COMPLICATIONS: None immediate. She has done well postpartum and postoperatively   She states she has no pain at incision now. Lochia is moderate. Breastfeeding well. Ready for discharge.    Physical Exam:  General: alert, cooperative and no distress Lochia:  appropriate Uterine Fundus: firm Incision: healing well, no significant drainage DVT Evaluation: No evidence of DVT seen on physical exam.  Discharge Diagnoses: Term Pregnancy-delivered and Post BTL  Discharge Information: Date: 05/07/2014 Activity: unrestricted and pelvic rest Diet: routine Medications: PNV, Ibuprofen and Percocet Condition: stable Instructions: refer to practice specific booklet Discharge to: home Follow-up Information   Follow up with [place]. Schedule an appointment as soon as possible for a visit in 4 weeks.   Contact information:   [place] [phone]      Newborn Data: Live born female  Birth Weight: 7 lb 14.5 oz (3586 g) APGAR: 8, 9  Home with mother.  [facility] 05/07/2014, 6:15 AM

## 2014-05-08 ENCOUNTER — Encounter (HOSPITAL_COMMUNITY): Payer: Self-pay | Admitting: Family Medicine

## 2014-05-08 NOTE — Discharge Summary (Signed)
Attestation of Attending Supervision of Advanced Practitioner (PA/CNM/NP): Evaluation and management procedures were performed by the Advanced Practitioner under my supervision and collaboration.  I have reviewed the Advanced Practitioner's note and chart, and I agree with the management and plan.  Emory Leaver, MD, FACOG Attending Obstetrician & Gynecologist Faculty Practice, [facility]   

## 2014-05-08 NOTE — Progress Notes (Signed)
Post discharge ur review completed. 

## 2014-05-08 NOTE — Progress Notes (Signed)
Post discharge chart review completed.  

## 2014-05-09 ENCOUNTER — Other Ambulatory Visit: Payer: Medicaid Other

## 2014-05-11 ENCOUNTER — Ambulatory Visit (HOSPITAL_COMMUNITY): Payer: Medicaid Other

## 2014-05-12 ENCOUNTER — Inpatient Hospital Stay (HOSPITAL_COMMUNITY): Admission: RE | Admit: 2014-05-12 | Payer: Medicaid Other | Source: Ambulatory Visit

## 2014-05-22 ENCOUNTER — Encounter: Payer: Self-pay | Admitting: Family Medicine

## 2014-05-25 ENCOUNTER — Telehealth: Payer: Self-pay | Admitting: *Deleted

## 2014-05-25 NOTE — Telephone Encounter (Signed)
Contacted patient and requested she come into office on Monday 05/29/2014 to complete a form.  Pt verbalizes understanding and will come in on Monday 10/5 and request Ann Woods.

## 2014-05-29 ENCOUNTER — Encounter: Payer: Self-pay | Admitting: *Deleted

## 2014-05-29 ENCOUNTER — Ambulatory Visit (HOSPITAL_COMMUNITY)
Admission: RE | Admit: 2014-05-29 | Discharge: 2014-05-29 | Disposition: A | Discharge status: Home / Self Care | Payer: Medicaid Other | Source: Ambulatory Visit | Attending: Obstetrics & Gynecology | Admitting: Obstetrics & Gynecology

## 2014-05-29 NOTE — Lactation Note (Signed)
Lactation Consult  Mother's reason for visit:  Here for support group to check baby's intake Visit Type:  OP Appointment Notes:  [name] Galas is not transferring from the breast.  Mom is occasionally able to pump 1 once.  He is not gaining weight.  Plan is to formula feed for now and increase mom's milk supply.  Follow-up on Thursday with lactation. Consult:  Initial Lactation Consultant:  Van Clines  ________________________________________________________________________ Ann Woods Name: Ann Woods  Date of Birth: 05/05/2014  Pediatrician: [facility]  Gender: female  Gestational Age: [redacted]w[redacted]d (At Birth)  Birth Weight: 7 lb 14.5 oz (3586 g)  Weight at Discharge: Weight: 7 lb 5.8 oz (3340 g) Date of Discharge: 05/07/2014  Filed Weights   05/05/14 1350 05/06/14 0140 05/06/14 2327  Weight: 7 lb 14.5 oz (3585 g) 7 lb 10.2 oz (3465 g) 7 lb 5.8 oz (3340 g)  [facility] weight taken from location [facility]: 7+13 3 days ago Location:Pediatrician's office  Weight today: 7+13    ________________________________________________________________________  Mother's Name: [name] Type of delivery:  vaginal Breastfeeding Experience:  Para 1 Maternal Medical Conditions:  denies Maternal Medications:  PNV  ________________________________________________________________________  Breastfeeding History (Post Discharge)  Frequency of breastfeeding:  Every 2 hours around the clock Duration of feeding:  30-40 minutes    Pumping  Type of pump:  Manual Frequency:  Occasional  Volume:  30 ml  Infant Intake and Output Assessment  Voids:  6 in 24 hrs.  Color:  Clear yellow Stools:  1 in 24 hrs.  Color:  Yellow  ________________________________________________________________________  Maternal Breast Assessment  Breast:  Soft Nipple:  Erect Pain level:  0 Pain interventions:    _______________________________________________________________________ Feeding  Assessment/Evaluation  Initial feeding assessment:  Infant's oral assessment:  Variance  High palate; does not maintain suction  Positioning:  Football Right breast  LATCH documentation:  Latch:  1 = Repeated attempts needed to sustain latch, nipple held in mouth throughout feeding, stimulation needed to elicit sucking reflex.  Audible swallowing:  1 = A few with stimulation  Type of nipple:  2 = Everted at rest and after stimulation  Comfort (Breast/Nipple):  2 = Soft / non-tender  Hold (Positioning):  2 = No assistance needed to correctly position infant at breast  LATCH score:  8  Attached assessment:  Deep  Lips flanged:  Bottom lip flanged  Lips untucked:  Yes.  top lip   Suck assessment:  Nonnutritive  Tools:  Syringe with 5 Fr feeding tube  Initiated to see if it would improve suckle but did not Instructed on use and cleaning of tool:  No.  Pre-feed weight:    (7 lb. 13 oz.)  Amount transferred:  4 ml Amount supplemented:  110 ml    Total amount pumped post feed: 5 ml with a lansinoh pump  Total amount transferred:  8 ml Total supplement given:  110 ml

## 2014-06-01 ENCOUNTER — Ambulatory Visit (HOSPITAL_COMMUNITY)
Admission: RE | Admit: 2014-06-01 | Discharge: 2014-06-01 | Disposition: A | Discharge status: Home / Self Care | Payer: Medicaid Other | Source: Ambulatory Visit | Attending: Obstetrics & Gynecology | Admitting: Obstetrics & Gynecology

## 2014-06-01 NOTE — Lactation Note (Signed)
Lactation Consult  Mother's reason for visit:  57 weight  Visit Type:  Feeding assessment  Appointment Notes:  Poor weight gain , low milk supply  Consult:  Initial consult  Lactation Consultant:  Myer Haff  Baby Boy Ann Woods  ________________________________________________________________________  [name] Name: Ann Woods  Date of Birth: 05/05/2014  Pediatrician:[facility] - Dr. Carrolyn Meiers  Gender: female  Gestational Age: [redacted]w[redacted]d (At Birth)  Birth Weight: 7 lb 14.5 oz (3586 g)  Weight at Discharge: Weight: 7 lb 5.8 oz (3340 g) Date of Discharge: 05/07/2014  Filed Weights   05/05/14 1350 05/06/14 0140 05/06/14 2327  Weight: 7 lb 14.5 oz (3585 g) 7 lb 10.2 oz (3465 g) 7 lb 5.8 oz (3340 g)  Last weight taken from location outside of [facility]: 7-13 oz 10/6  Location: Breast feeding Support group at [facility]  Weight today: 8-1.4 oz    ________________________________________________________________________  Mother's Name: [name] Type of delivery:   Breastfeeding Experience: 1st baby less than 1 month  , 2nd attempted last than one week  Maternal Medical Conditions:  High B/P , per mom not on meds for B/P  Maternal Medications:  PNV per mom   ________________________________________________________________________  Breastfeeding History (Post Discharge)- per mom breast got full after a few days of breast  Feeding , but not over full , and no leakage , or engorgement.   Frequency of breastfeeding:  Every 2-3 hours  Duration of feeding:  20 -30 mins   Supplementing - 60 ml of formula after each feeding.(  Per mom if I have breast milk I give it to the Baby)  Pumping - manual - if baby isn't latching , 1 oz  Infant Intake and Output Assessment  Voids:  5-6  in 24 hrs.  Color:  Clear yellow Stools:  1-2  in 24 hrs.  Color:  Yellow  ________________________________________________________________________  Maternal Breast  Assessment  Breast:  Soft Nipple:  Erect Pain level:  0 Pain interventions:  Expressed breast milk  _______________________________________________________________________ Feeding Assessment/Evaluation- baby alert , moist skin and mucous membranes, well hydrated fussy and acting hungry. Happier after feeding   Initial feeding assessment:  Infant's oral assessment:  Note a significantly high palate, mobility of tongue adequate   Positioning:  Football Right breast  LATCH documentation:  Latch:  2 = Grasps breast easily, tongue down, lips flanged, rhythmical sucking.  Audible swallowing:  2 = Spontaneous and intermittent  Type of nipple:  2 = Everted at rest and after stimulation  Comfort (Breast/Nipple):  2 = Soft / non-tender  Hold (Positioning):  1 = Assistance needed to correctly position infant at breast and maintain latch  LATCH score: 9   Attached assessment:  Shallow  Lips flanged:  No.  Lips untucked:  Yes.    Suck assessment:  Nutritive  And non - nutritive and puling back away from the breast   Tools:  None at this latch  Instructed on use and cleaning of tool:  No   Pre-feed weight:  3668g  (8 lb. 1.4 oz.) Post-feed weight:  3680  g (8  Lb.1.5oz.) Amount transferred:  12  ml Amount supplemented:  None   Additional Feeding Assessment -   Infant's oral assessment:  Variance ( see above note )   Positioning:  Football Right breast  LATCH documentation:  Latch:  2 = Grasps breast easily, tongue down, lips flanged, rhythmical sucking.  Audible swallowing:  2 = Spontaneous and intermittent  Type of  nipple:  2 = Everted at rest and after stimulation  Comfort (Breast/Nipple):  2 = Soft / non-tender  Hold (Positioning):  1 = Assistance needed to correctly position infant at breast and maintain latch  LATCH score: 9   Attached assessment:  Shallow ( LC assisted with depth )   Lips flanged:  Yes.    Lips untucked:  Yes.    Suck assessment:   Nonnutritive  Tools:  Supplemental nutrition system  ( added due to the baby being non - nutritive at the breast  And pulling back away from the mother expecting increase flow )  Instructed on use and cleaning of tool:  Yes.    Pre-feed weight:  3680  g  (8  lb. 1.4 oz.) Post-feed weight:  3688  g (8  lb. 2.1 oz.) Amount transferred:  0  ( only from the SNS , no transfer of milk from the breast 2nd latch )  Amount supplemented:  the 8 ml from the SNS ( amount transferred )   3rd latch on the left breast -   See above for breast assessment  and baby assessment  Pre-weight - after wet diaper changed - 3688g 8-2.1 oz   Post weight - 3730 g 8-3.6 oz  Amount transferred - zero breast milk  Supplement - SNS - 42 ml   Total amount pumped post feed:  Did not post pump   Total amount transferred:  12  Ml ( EBM  Total supplement given:  50  Ml Total for feeding: 62 ml  Lactation Plan Care - Keep up the the great efforts breast feeding                                   - Call [facility] to activate Ann Woods, check for a DEBP , and obtain DEBP                                   - Feed at the breast with SNS , ( Day/Evening 4-5 x's a day ) , and increase volume as needed with feeding cues.                                  - Extra pumping- Both breast for 10 -15 mins after 4-6 feedings ,( mom declined renting a pump )                                   - Recommended [employer] product to enhance milk supply                                   - per mom F/U with [facility] 06/12/2015   Stressed the importance of continuing to supplement after breast feeding

## 2014-06-26 ENCOUNTER — Encounter (HOSPITAL_COMMUNITY): Payer: Self-pay | Admitting: Family Medicine

## 2014-06-26 ENCOUNTER — Encounter: Payer: Self-pay | Admitting: *Deleted

## 2014-06-28 ENCOUNTER — Encounter: Payer: Self-pay | Admitting: *Deleted

## 2014-06-28 ENCOUNTER — Ambulatory Visit (INDEPENDENT_AMBULATORY_CARE_PROVIDER_SITE_OTHER): Payer: Medicaid Other | Admitting: Family Medicine

## 2014-06-28 ENCOUNTER — Encounter: Payer: Self-pay | Admitting: Family Medicine

## 2014-06-28 MED ORDER — PRENATAL MULTIVITAMIN CH: 1.0000 | ORAL_TABLET | Freq: Every day | ORAL | Status: DC

## 2014-06-28 NOTE — Progress Notes (Signed)
  Subjective:     Ann Woods is a 38 y.o. female who presents for a postpartum visit. She is 6 weeks postpartum following a spontaneous vaginal delivery. I have fully reviewed the prenatal and intrapartum course. The delivery was at 8 gestational weeks. Outcome: spontaneous vaginal delivery. Anesthesia: none. Postpartum course has been normal. Baby's course has been normal. Baby is feeding by with formula supplementation. Bleeding no bleeding. Bowel function is normal. Bladder function is normal. Patient is not sexually active. Contraception method is tubal ligation. Postpartum depression screening: negative.  The following portions of the patient's history were reviewed and updated as appropriate: allergies, current medications, past family history, past medical history, past social history, past surgical history and problem list.  Review of Systems Pertinent items are noted in HPI.   Objective:    BP 131/80 mmHg  Pulse 92  Temp(Src) 98.2 F (36.8 C) (Oral)  Ht 5\' 5"  (1.651 m)  Wt 334 lb 1.6 oz (151.547 kg)  BMI 55.60 kg/m2  LMP  (LMP Unknown)  Breastfeeding? Yes  General:  alert and no distress  Lungs: clear to auscultation bilaterally  Heart:  regular rate and rhythm, S1, S2 normal, no murmur, click, rub or gallop  Abdomen: soft, non-tender; bowel sounds normal; no masses,  no organomegaly        Assessment:     6 postpartum exam. Pap smear not done at today's visit.   Plan:    1. Contraception: tubal ligation 2. Follow up in: 1 year or as needed.

## 2014-06-28 NOTE — Patient Instructions (Signed)
  Place postpartum visit patient instructions here.  

## 2014-07-17 ENCOUNTER — Emergency Department (HOSPITAL_COMMUNITY)
Admission: EM | Admit: 2014-07-17 | Discharge: 2014-07-17 | Disposition: A | Discharge status: Home / Self Care | Payer: Medicaid Other | Source: Home / Self Care | Attending: Family Medicine | Admitting: Family Medicine

## 2014-07-17 ENCOUNTER — Ambulatory Visit (HOSPITAL_COMMUNITY): Payer: Medicaid Other | Attending: Family Medicine

## 2014-07-17 ENCOUNTER — Encounter (HOSPITAL_COMMUNITY): Payer: Self-pay | Admitting: Emergency Medicine

## 2014-07-17 DIAGNOSIS — J189 Pneumonia, unspecified organism: Secondary | ICD-10-CM | POA: Diagnosis not present

## 2014-07-17 DIAGNOSIS — J029 Acute pharyngitis, unspecified: Secondary | ICD-10-CM | POA: Diagnosis present

## 2014-07-17 DIAGNOSIS — I517 Cardiomegaly: Secondary | ICD-10-CM | POA: Diagnosis not present

## 2014-07-17 DIAGNOSIS — R0602 Shortness of breath: Secondary | ICD-10-CM | POA: Diagnosis present

## 2014-07-17 DIAGNOSIS — R079 Chest pain, unspecified: Secondary | ICD-10-CM | POA: Diagnosis present

## 2014-07-17 MED ORDER — MOXIFLOXACIN HCL 400 MG PO TABS: 400.0000 mg | ORAL_TABLET | Freq: Every day | ORAL | Status: DC

## 2014-07-17 MED ORDER — CEFTRIAXONE SODIUM 1 G IJ SOLR
1.0000 g | Freq: Once | INTRAMUSCULAR | Status: AC
  Administered 2014-07-17: 1 g via INTRAMUSCULAR

## 2014-07-17 MED ORDER — CEFTRIAXONE SODIUM 1 G IJ SOLR
INTRAMUSCULAR | Status: AC
  Filled 2014-07-17: qty 10

## 2014-07-17 MED ORDER — LIDOCAINE HCL (PF) 1 % IJ SOLN
INTRAMUSCULAR | Status: AC
  Filled 2014-07-17: qty 5

## 2014-07-17 NOTE — ED Notes (Signed)
Patient is aware of post injection delay prior to being discharged from department

## 2014-07-17 NOTE — ED Provider Notes (Signed)
CSN: [id number]     Arrival date & time 07/17/14  1432 History   First MD Initiated Contact with Patient 07/17/14 1528     Chief Complaint  Patient presents with  . Shortness of Breath   (Consider location/radiation/quality/duration/timing/severity/associated sxs/prior Treatment) Patient is a 38 y.o. female presenting with shortness of breath. The history is provided by the patient.  Shortness of Breath Severity:  Moderate Onset quality:  Gradual Duration:  1 day Progression:  Worsening Chronicity:  New Context: URI   Associated symptoms: cough, fever and wheezing   Associated symptoms: no abdominal pain and no chest pain     Past Medical History  Diagnosis Date  . Seasonal allergies   . Obesity   . Hypertension     G2 - preeclampsia, CHTN   Past Surgical History  Procedure Laterality Date  . Hip surgery    . Cesarean section    . Tubal ligation Bilateral 05/05/2014    Procedure: POST PARTUM TUBAL LIGATION;  Surgeon: Donnamae Jude, MD;  Location: [facility];  Service: Gynecology;  Laterality: Bilateral;   No family history on file. History  Substance Use Topics  . Smoking status: Former Smoker    Quit date: 08/25/1993  . Smokeless tobacco: Never Used  . Alcohol Use: No   OB History    Gravida Para Term Preterm AB TAB SAB Ectopic Multiple Living   3 3 3  0 0 0 0 0 0 3     Review of Systems  Constitutional: Positive for fever.  HENT: Negative.   Respiratory: Positive for cough, shortness of breath and wheezing.   Cardiovascular: Negative for chest pain.  Gastrointestinal: Negative for abdominal pain.    Allergies  Review of patient's allergies indicates no known allergies.  Home Medications   Prior to Admission medications   Medication Sig Start Date End Date Taking? Authorizing Provider  Dextromethorphan HBr (TUSSIN COUGH PO) Take by mouth.   Yes Historical Provider, MD  GuaiFENesin (MUCINEX PO) Take by mouth.   Yes Historical Provider, MD  Sodium & Potassium  Bicarbonate (ALKA-SELTZER GOLD PO) Take by mouth.   Yes Historical Provider, MD  ibuprofen (ADVIL,MOTRIN) 600 MG tablet Take 1 tablet (600 mg total) by mouth every 6 (six) hours. 05/07/14   Seabron Spates, CNM  moxifloxacin (AVELOX) 400 MG tablet Take 1 tablet (400 mg total) by mouth daily. One tab daily 07/17/14   Billy Fischer, MD  oxyCODONE-acetaminophen (PERCOCET/ROXICET) 5-325 MG per tablet Take 1 tablet by mouth every 4 (four) hours as needed (for pain scale less than 7). 05/07/14   Seabron Spates, CNM  Prenatal Vit-Fe Fumarate-FA (PRENATAL MULTIVITAMIN) TABS tablet Take 1 tablet by mouth daily at 12 noon. 06/28/14   Tanna Savoy Stinson, DO   BP 161/99 mmHg  Pulse 69  Temp(Src) 99.6 F (37.6 C) (Oral)  Resp 16  SpO2 98%  LMP  (LMP Unknown) Physical Exam  Constitutional: She is oriented to person, place, and time. She appears well-developed and well-nourished. No distress.  HENT:  Right Ear: External ear normal.  Left Ear: External ear normal.  Mouth/Throat: Oropharynx is clear and moist.  Neck: Normal range of motion. Neck supple.  Pulmonary/Chest: Effort normal. No accessory muscle usage. No respiratory distress. She has decreased breath sounds in the left lower field. She has wheezes in the left lower field. She has rales in the left lower field.  Neurological: She is alert and oriented to person, place, and time.  Skin: Skin is  warm and dry.  Nursing note and vitals reviewed.   ED Course  Procedures (including critical care time) Labs Review Labs Reviewed - No data to display  Imaging Review Dg Chest 2 View  07/17/2014   CLINICAL DATA:  Sore throat for 1 week, shortness of breath, chest pain, cough and congestion for 3 days, personal history of hypertension, obesity, former smoker  EXAM: CHEST  2 VIEW  COMPARISON:  01/15/2010  FINDINGS: Mild enlargement of cardiac silhouette.  Mediastinal contours and pulmonary vascularity normal.  Lingular infiltrate consistent with  pneumonia.  Remaining lungs clear.  No pleural effusion or pneumothorax.  Clothing artifacts project over chest.  IMPRESSION: Lingular consolidation consistent with pneumonia.  Enlargement of cardiac silhouette.   Electronically Signed   By: Lavonia Dana M.D.   On: 07/17/2014 17:46     MDM   1. Lingular pneumonia        Billy Fischer, MD 07/17/14 1800

## 2014-07-17 NOTE — ED Notes (Signed)
Minimal sore throat, left ear pain, and has a headache.

## 2014-07-17 NOTE — Discharge Instructions (Signed)
Drink plenty of water and use mucinex as discussed, take all of antibiotic, see your doctor for repeat x-ray in 2 weeks, sooner if any problems.

## 2014-08-08 ENCOUNTER — Encounter: Payer: Self-pay | Admitting: *Deleted

## 2015-12-03 IMAGING — US US OB LIMITED
1 series · 13 of 18 positions shown · non-contrast
Comparison: none

[Series 1: us ob limited · 0.26mm/px · 18 acquisitions, 13 frames shown]
[im 1/18]
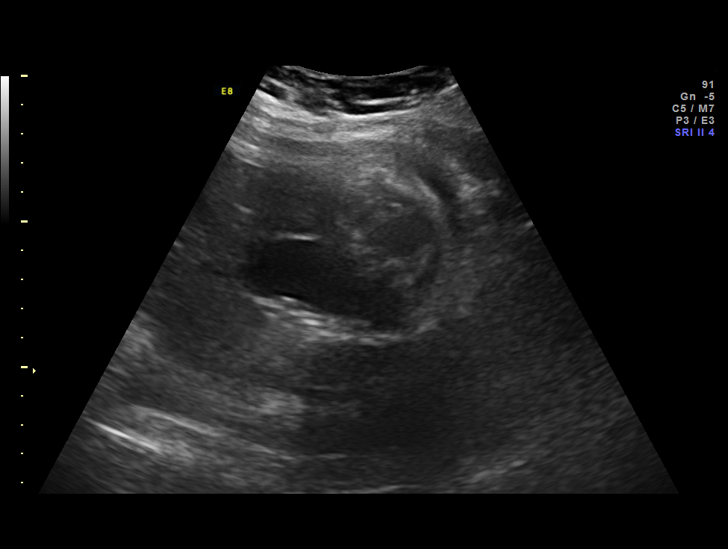
[im 3/18]
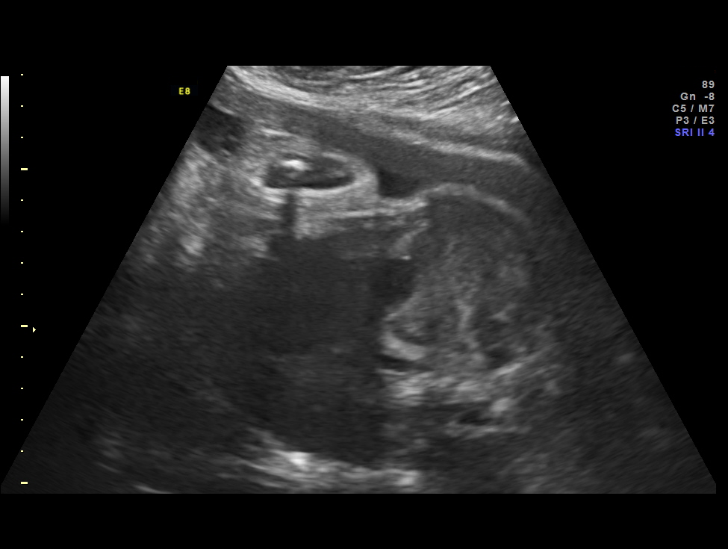
[im 4/18]
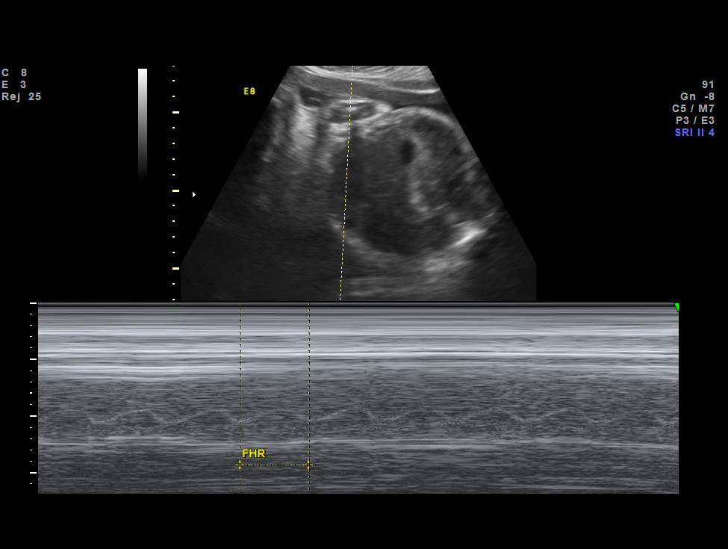
[im 5/18]
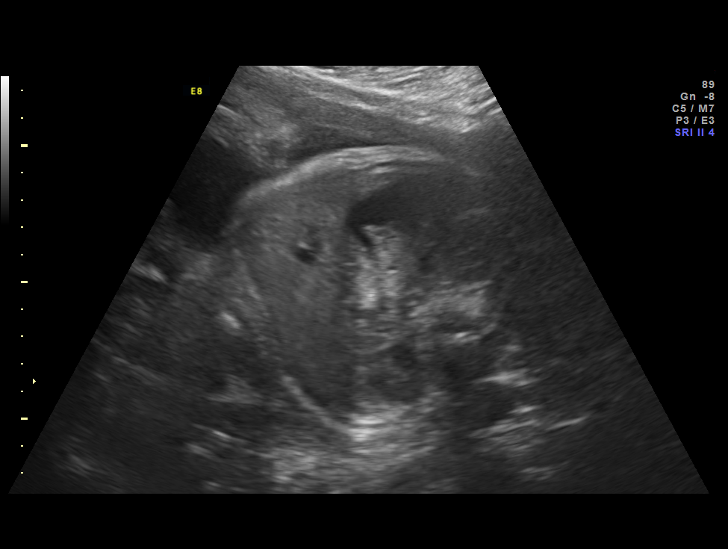
[im 7/18]
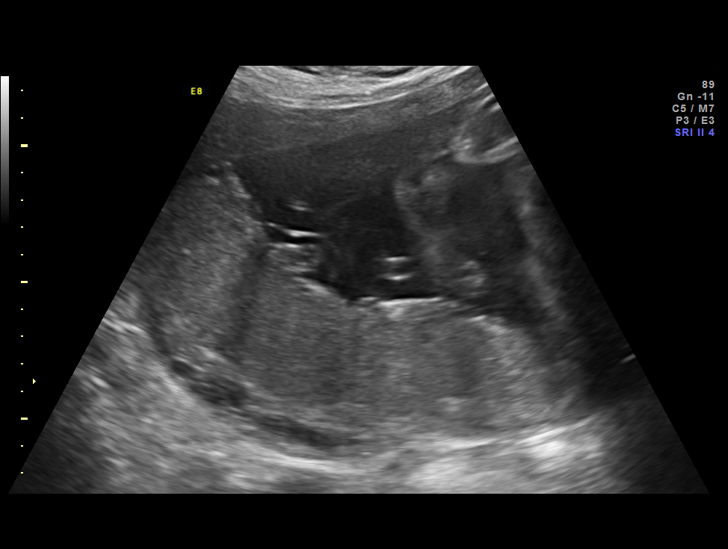
[im 8/18]
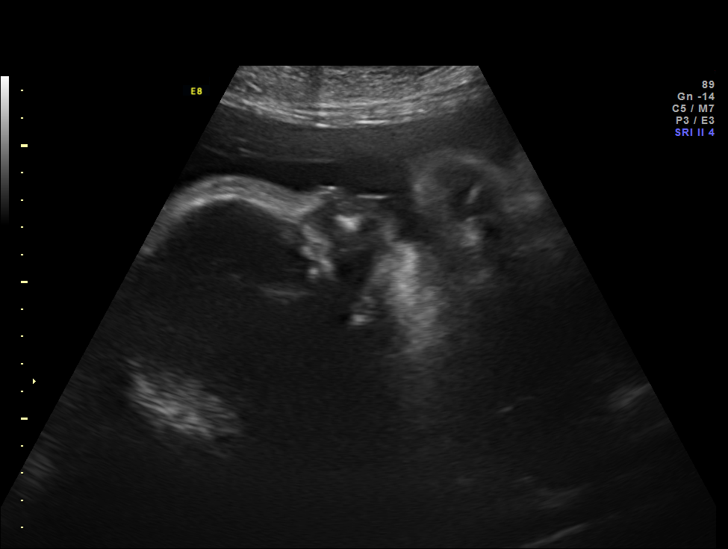
[im 10/18]
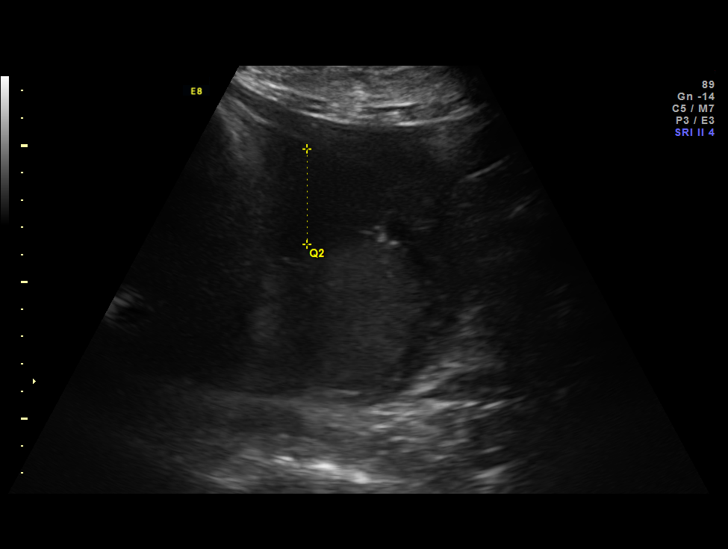
[im 11/18]
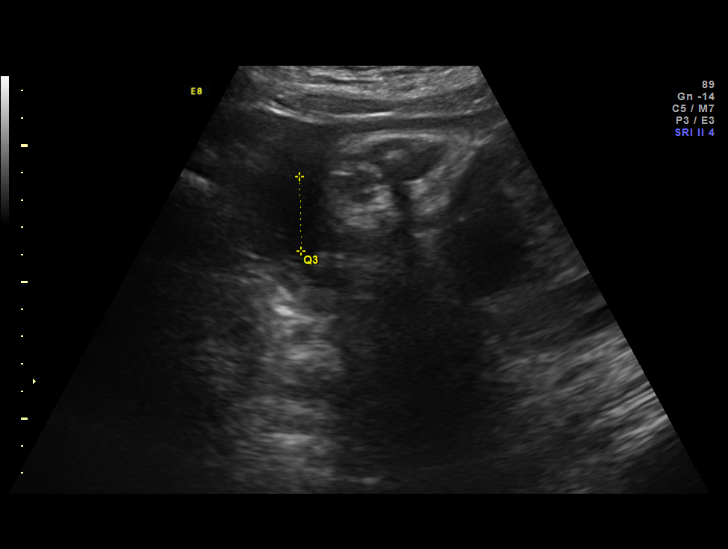
[im 12/18]
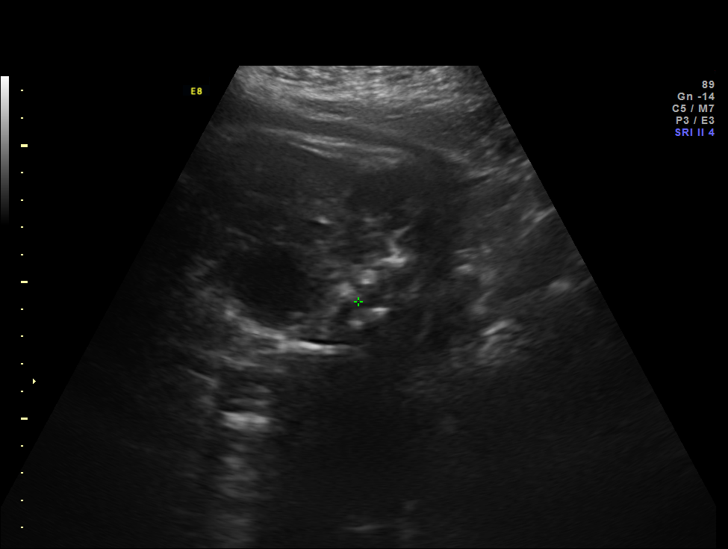
[im 14/18]
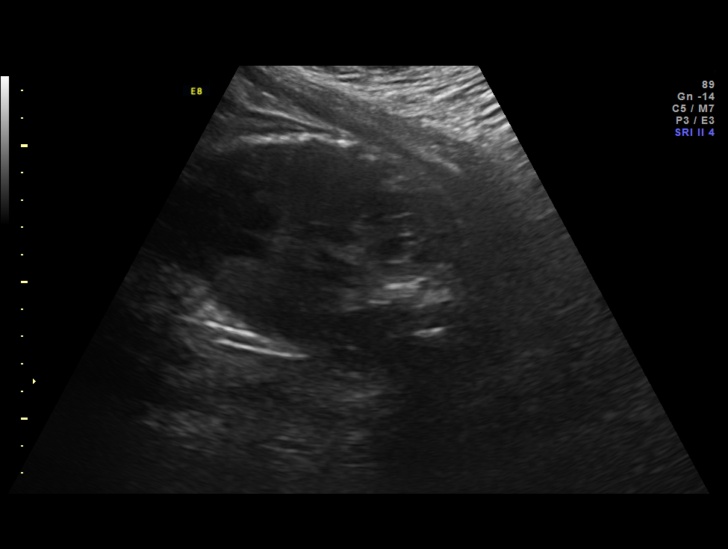
[im 15/18]
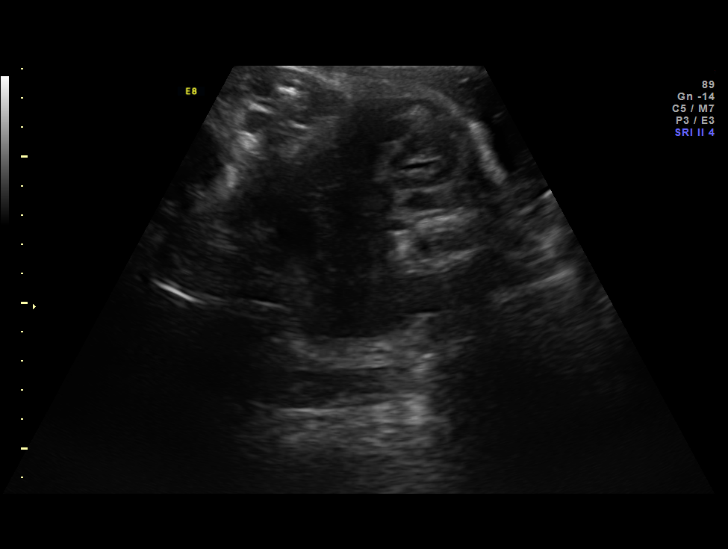
[im 16/18]
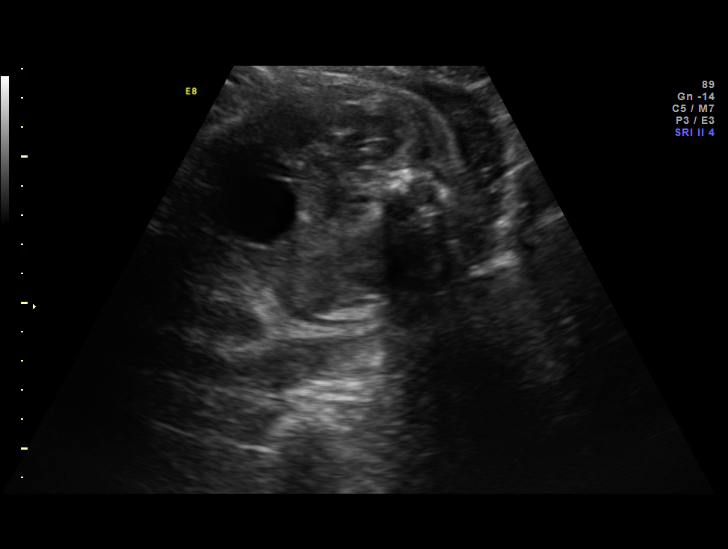
[im 18/18]
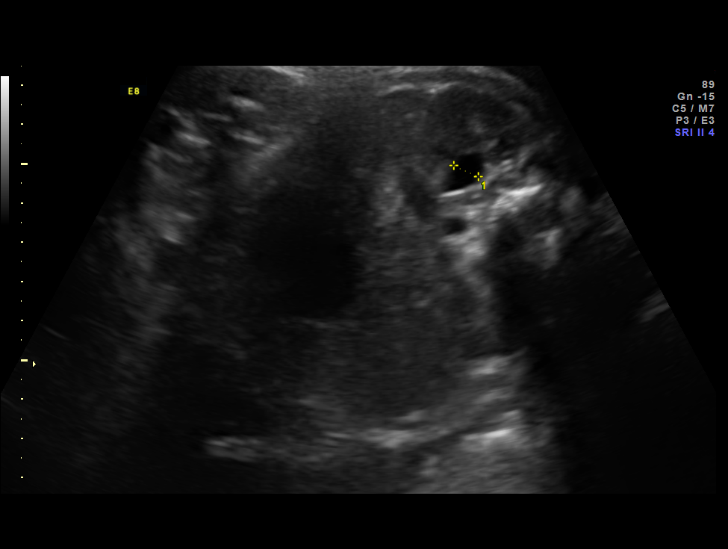

[13 of 18 positions shown; findings below may reference images not displayed]

OBSTETRICS REPORT
                      (Signed Final 04/06/2014 [DATE])

Service(s) Provided

 [HOSPITAL]                                         76815.0
Indications

 Pyelectasis of fetus on prenatal ultrasound           655.83 593.89,

 Advanced maternal age (AMA), Multigravida - low
 risk NIPS
 Hypertension - Chronic/Pre-existing (no meds)
 History of cesarean delivery, currently pregnant      654.20,
 Maternal morbid obesity (346 lb)
Fetal Evaluation

 Num Of Fetuses:    1
 Fetal Heart Rate:  147                          bpm
 Cardiac Activity:  Observed
 Presentation:      Frank breech
 Placenta:          Posterior, above cervical
                    os
 P. Cord            Previously Visualized
 Insertion:

 Amniotic Fluid
 AFI FV:      Subjectively within normal limits
 AFI Sum:     10.98   cm       28  %Tile     Larg Pckt:    4.76  cm
 RUQ:   4.76    cm   LUQ:    3.49   cm    LLQ:   2.73    cm
Biophysical Evaluation

 Amniotic F.V:   Within normal limits       F. Tone:        Observed
 F. Movement:    Observed                   Score:          [DATE]
 F. Breathing:   Observed
Gestational Age

 LMP:           31w 4d        Date:  08/28/13                 EDD:   06/04/14
 Best:          34w 6d     Det. By:  U/S (11/30/13)           EDD:   05/12/14
Cervix Uterus Adnexa

 Cervix:       Not visualized (advanced GA >67wks)
Impression

 SIUP at 34+6 weeks
 Normal amniotic fluid volume
 BPP [DATE]
Recommendations

 Continue twice weekly NSTs with weekly AFIs
 Growth in 2-3 weeks

## 2016-09-09 ENCOUNTER — Encounter (HOSPITAL_COMMUNITY): Payer: Self-pay | Admitting: Emergency Medicine

## 2016-09-09 ENCOUNTER — Ambulatory Visit (HOSPITAL_COMMUNITY)
Admission: EM | Admit: 2016-09-09 | Discharge: 2016-09-09 | Disposition: A | Discharge status: Home / Self Care | Payer: Medicaid Other | Attending: Family Medicine | Admitting: Family Medicine

## 2016-09-09 DIAGNOSIS — J111 Influenza due to unidentified influenza virus with other respiratory manifestations: Secondary | ICD-10-CM

## 2016-09-09 DIAGNOSIS — R69 Illness, unspecified: Secondary | ICD-10-CM

## 2016-09-09 NOTE — ED Provider Notes (Signed)
[facility]    CSN: [id number] Arrival date & time: 09/09/16  1627     History   Chief Complaint Chief Complaint  Patient presents with  . Generalized Body Aches  . Fever    HPI Ann Woods is a 41 y.o. female.    Influenza  Presenting symptoms: fever, myalgias and nausea   Presenting symptoms: no diarrhea, no rhinorrhea and no vomiting   Severity:  Mild Onset quality:  Gradual Duration:  3 days Chronicity:  New Relieved by:  None tried Worsened by:  Nothing Ineffective treatments:  None tried Associated symptoms: decreased appetite and decreased physical activity   Risk factors: sick contacts     Past Medical History:  Diagnosis Date  . Hypertension    G2 - preeclampsia, CHTN  . Obesity   . Seasonal allergies     Patient Active Problem List   Diagnosis Date Noted  . PROM (premature rupture of membranes) 05/05/2014  . Sterilization 05/05/2014  . Hepatitis B affecting pregnancy, antepartum 04/03/2014  . Chronic Hypertension in pregnancy 12/01/2013  . Hepatitis B 12/01/2013  . Supervision of high-risk pregnancy 12/01/2013  . AMA (advanced maternal age) multigravida 35+ 11/30/2013  . Previous cesarean delivery, antepartum condition or complication [id number]  . ALLERGIC RHINITIS 05/31/2007  . 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 OBESITY 04/28/2007  . ANEMIA-IRON DEFICIENCY 04/28/2007  . PAP SMEAR, ABNORMAL 04/28/2007    Past Surgical History:  Procedure Laterality Date  . CESAREAN SECTION    . HIP SURGERY    . TUBAL LIGATION Bilateral 05/05/2014   Procedure: POST PARTUM TUBAL LIGATION;  Surgeon: Donnamae Jude, MD;  Location: [facility];  Service: Gynecology;  Laterality: Bilateral;    OB History    Gravida Para Term Preterm AB Living   3 3 3  0 0 3   SAB TAB Ectopic Multiple Live Births   0 0 0 0 3       Home Medications    Prior to Admission medications   Medication Sig Start Date End Date Taking? Authorizing Provider  Dextromethorphan HBr  (TUSSIN COUGH PO) Take by mouth.    Historical Provider, MD  GuaiFENesin (MUCINEX PO) Take by mouth.    Historical Provider, MD  ibuprofen (ADVIL,MOTRIN) 600 MG tablet Take 1 tablet (600 mg total) by mouth every 6 (six) hours. 05/07/14   Seabron Spates, CNM  moxifloxacin (AVELOX) 400 MG tablet Take 1 tablet (400 mg total) by mouth daily. One tab daily 07/17/14   Billy Fischer, MD  oxyCODONE-acetaminophen (PERCOCET/ROXICET) 5-325 MG per tablet Take 1 tablet by mouth every 4 (four) hours as needed (for pain scale less than 7). 05/07/14   Seabron Spates, CNM  Prenatal Vit-Fe Fumarate-FA (PRENATAL MULTIVITAMIN) TABS tablet Take 1 tablet by mouth daily at 12 noon. 06/28/14   Truett Mainland, DO  Sodium & Potassium Bicarbonate (ALKA-SELTZER GOLD PO) Take by mouth.    Historical Provider, MD    Family History History reviewed. No pertinent family history.  Social History Social History  Substance Use Topics  . Smoking status: Former Smoker    Quit date: 08/25/1993  . Smokeless tobacco: Never Used  . Alcohol use No     Allergies   Patient has no known allergies.   Review of Systems Review of Systems  Constitutional: Positive for activity change, appetite change, decreased appetite and fever.  HENT: Negative.  Negative for rhinorrhea.   Respiratory: Negative.   Cardiovascular: Negative.   Gastrointestinal: Positive for nausea.  Negative for diarrhea and vomiting.  Genitourinary: Negative.   Musculoskeletal: Positive for myalgias.  All other systems reviewed and are negative.    Physical Exam Triage Vital Signs ED Triage Vitals [09/09/16 1644]  Enc Vitals Group     BP 153/80     Pulse Rate 93     Resp 18     Temp 100.5 F (38.1 C)     Temp Source Oral     SpO2 99 %     Weight      Height      Head Circumference      Peak Flow      Pain Score 7     Pain Loc      Pain Edu?      Excl. in [place]?    No data found.   Updated Vital Signs BP 153/80 (BP Location: Right Wrist)    Pulse 93   Temp 100.5 F (38.1 C) (Oral)   Resp 18   LMP 09/01/2016   SpO2 99%   Visual Acuity Right Eye Distance:   Left Eye Distance:   Bilateral Distance:    Right Eye Near:   Left Eye Near:    Bilateral Near:     Physical Exam  Constitutional: She is oriented to person, place, and time. She appears well-developed and well-nourished.  HENT:  Right Ear: External ear normal.  Left Ear: External ear normal.  Mouth/Throat: Oropharynx is clear and moist.  Neck: Normal range of motion. Neck supple.  Cardiovascular: Normal rate and regular rhythm.   Pulmonary/Chest: Effort normal and breath sounds normal.  Abdominal: Soft. Bowel sounds are normal.  Lymphadenopathy:    She has no cervical adenopathy.  Neurological: She is alert and oriented to person, place, and time.  Skin: Skin is warm and dry.  Nursing note and vitals reviewed.    UC Treatments / Results  Labs (all labs ordered are listed, but only abnormal results are displayed) Labs Reviewed - No data to display  EKG  EKG Interpretation None       Radiology No results found.  Procedures Procedures (including critical care time)  Medications Ordered in UC Medications - No data to display   Initial Impression / Assessment and Plan / UC Course  I have reviewed the triage vital signs and the nursing notes.  Pertinent labs & imaging results that were available during my care of the patient were reviewed by me and considered in my medical decision making (see chart for details).       Final Clinical Impressions(s) / UC Diagnoses   Final diagnoses:  Influenza-like illness    New Prescriptions Discharge Medication List as of 09/09/2016  5:14 PM       Billy Fischer, MD 09/23/16 2156

## 2016-09-09 NOTE — ED Triage Notes (Signed)
Pt has been suffering from body aches and fever since Sunday night.  She denies any other symptoms.  She has been taking 500mg  of Tylenol with some relief of her fever.

## 2017-03-22 ENCOUNTER — Encounter (HOSPITAL_COMMUNITY): Payer: Self-pay | Admitting: Emergency Medicine

## 2017-03-22 ENCOUNTER — Ambulatory Visit (HOSPITAL_COMMUNITY)
Admission: EM | Admit: 2017-03-22 | Discharge: 2017-03-22 | Disposition: A | Discharge status: Home / Self Care | Payer: Medicaid Other | Attending: Internal Medicine | Admitting: Internal Medicine

## 2017-03-22 DIAGNOSIS — R0981 Nasal congestion: Secondary | ICD-10-CM

## 2017-03-22 DIAGNOSIS — R059 Cough, unspecified: Secondary | ICD-10-CM

## 2017-03-22 DIAGNOSIS — M5442 Lumbago with sciatica, left side: Secondary | ICD-10-CM

## 2017-03-22 DIAGNOSIS — R05 Cough: Secondary | ICD-10-CM

## 2017-03-22 MED ORDER — CYCLOBENZAPRINE HCL 5 MG PO TABS: 5.0000 mg | ORAL_TABLET | Freq: Every evening | ORAL | 0 refills | Status: AC | PRN

## 2017-03-22 MED ORDER — NAPROXEN 500 MG PO TABS: 500.0000 mg | ORAL_TABLET | Freq: Two times a day (BID) | ORAL | 0 refills | Status: DC

## 2017-03-22 MED ORDER — PREDNISONE 50 MG PO TABS: 50.0000 mg | ORAL_TABLET | Freq: Every day | ORAL | 0 refills | Status: DC

## 2017-03-22 NOTE — Discharge Instructions (Addendum)
Anticipate gradual improvement in back and left leg pain over the next several days.  Physical therapy may provide additional relief.  Ice for 5-10 minutes several times daily may also help.  Prescriptions for prednisone (steroid, for pain down leg and congestion) and for naproxen (anti inflammatory/pain reliever) and for cyclobenzaprine (muscle relaxer, small dose to try at bed time) were sent to the pharmacy.  A primary care provider can help with additional evaluation/management of back pain symptoms and other health issues.

## 2017-03-22 NOTE — ED Triage Notes (Signed)
Left lower back pain that radiates to hip and down left leg.  Pain started 3 days ago.  No known injury

## 2017-03-23 NOTE — ED Provider Notes (Signed)
[facility]    CSN: [id number] Arrival date & time: 03/22/17  1856     History   Chief Complaint Chief Complaint  Patient presents with  . Back Pain    HPI Ann Woods is a 41 y.o. female. Presents with 3d pain in left lateral low back pain radiating down left leg.  No weakness/clumsiness of legs, no change in bowels/bladder, no loss of sensation.  No trauma, no unusual activities.  Taking tylenol but not much help.   Also some head congestion and dry cough.  No fever.  HPI  Past Medical History:  Diagnosis Date  . Hypertension    G2 - preeclampsia, CHTN  . Obesity   . Seasonal allergies     Patient Active Problem List   Diagnosis Date Noted  . PROM (premature rupture of membranes) 05/05/2014  . Sterilization 05/05/2014  . Hepatitis B affecting pregnancy, antepartum 04/03/2014  . Chronic Hypertension in pregnancy 12/01/2013  . Hepatitis B 12/01/2013  . Supervision of high-risk pregnancy 12/01/2013  . AMA (advanced maternal age) multigravida 35+ 11/30/2013  . Previous cesarean delivery, antepartum condition or complication 76/19/5093  . ALLERGIC RHINITIS 05/31/2007  . 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 OBESITY 04/28/2007  . ANEMIA-IRON DEFICIENCY 04/28/2007  . PAP SMEAR, ABNORMAL 04/28/2007    Past Surgical History:  Procedure Laterality Date  . CESAREAN SECTION    . HIP SURGERY    . TUBAL LIGATION Bilateral 05/05/2014   Procedure: POST PARTUM TUBAL LIGATION;  Surgeon: Donnamae Jude, MD;  Location: [facility];  Service: Gynecology;  Laterality: Bilateral;    OB History    Gravida Para Term Preterm AB Living   3 3 3  0 0 3   SAB TAB Ectopic Multiple Live Births   0 0 0 0 3       Home Medications    Prior to Admission medications   Medication Sig Start Date End Date Taking? Authorizing Provider  acetaminophen (TYLENOL) 325 MG tablet Take 650 mg by mouth every 6 (six) hours as needed.   Yes [provider]  ibuprofen (ADVIL,MOTRIN) 600 MG tablet  Take 1 tablet (600 mg total) by mouth every 6 (six) hours. 05/07/14  Yes Seabron Spates, CNM  cyclobenzaprine (FLEXERIL) 5 MG tablet Take 1 tablet (5 mg total) by mouth at bedtime as needed for muscle spasms. 03/22/17 04/06/17  Sherlene Shams, MD  naproxen (NAPROSYN) 500 MG tablet Take 1 tablet (500 mg total) by mouth 2 (two) times daily. 03/22/17   Sherlene Shams, MD  predniSONE (DELTASONE) 50 MG tablet Take 1 tablet (50 mg total) by mouth daily. 03/22/17   Sherlene Shams, MD  Sodium & Potassium Bicarbonate (ALKA-SELTZER GOLD PO) Take by mouth.    [provider]    Family History No family history on file.  Social History Social History  Substance Use Topics  . Smoking status: Former Smoker    Quit date: 08/25/1993  . Smokeless tobacco: Never Used  . Alcohol use No     Allergies   Patient has no known allergies.   Review of Systems Review of Systems  All other systems reviewed and are negative.    Physical Exam Triage Vital Signs ED Triage Vitals  Enc Vitals Group     BP 03/22/17 1944 (!) 145/78     Pulse Rate 03/22/17 1944 86     Resp 03/22/17 1944 (!) 24     Temp 03/22/17 1944 99.3 F (37.4 C)  Temp Source 03/22/17 1944 Oral     SpO2 03/22/17 1944 98 %     Weight --      Height --      Head Circumference --      Peak Flow --      Pain Score 03/22/17 1941 10     Pain Loc --      Pain Edu? --      Excl. in [place]? --    No data found.   Updated Vital Signs BP (!) 145/78 (BP Location: Right Arm)   Pulse 86   Temp 99.3 F (37.4 C) (Oral)   Resp (!) 24   LMP 02/28/2017   SpO2 98%   Visual Acuity Right Eye Distance:   Left Eye Distance:   Bilateral Distance:    Right Eye Near:   Left Eye Near:    Bilateral Near:     Physical Exam  Constitutional: She is oriented to person, place, and time. No distress.  HENT:  Head: Atraumatic.  B TMs dull, no erythema Mod nasal congestion bilat Throat a little injected with post nasal discharge    Eyes:  Conjugate gaze observed, no eye redness/discharge  Neck: Neck supple.  Cardiovascular: Normal rate and regular rhythm.   Pulmonary/Chest: No respiratory distress. She has no wheezes. She has no rales.  Lungs clear, symmetric breath sounds  Abdominal: She exhibits no distension.  Musculoskeletal: Normal range of motion.  Left lateral low back with mild spasm, no focal erythema/bruising/rash/swelling.  Mildly tender to palpation.  Neurological: She is alert and oriented to person, place, and time.  Able to climb on/off exam table, walked into urgent care independently.  Skin: Skin is warm and dry.  Nursing note and vitals reviewed.    UC Treatments / Results   Procedures Procedures (including critical care time) None today  Final Clinical Impressions(s) / UC Diagnoses   Final diagnoses:  Acute left-sided low back pain with left-sided sciatica  Sinus congestion  Cough   Anticipate gradual improvement in back and left leg pain over the next several days.  Physical therapy may provide additional relief.  Ice for 5-10 minutes several times daily may also help.  Prescriptions for prednisone (steroid, for pain down leg and congestion) and for naproxen (anti inflammatory/pain reliever) and for cyclobenzaprine (muscle relaxer, small dose to try at bed time) were sent to the pharmacy.  A primary care provider can help with additional evaluation/management of back pain symptoms and other health issues.    New Prescriptions Discharge Medication List as of 03/22/2017  8:15 PM    START taking these medications   Details  cyclobenzaprine (FLEXERIL) 5 MG tablet Take 1 tablet (5 mg total) by mouth at bedtime as needed for muscle spasms., Starting Sun 03/22/2017, Until Mon 04/06/2017, Normal    naproxen (NAPROSYN) 500 MG tablet Take 1 tablet (500 mg total) by mouth 2 (two) times daily., Starting Sun 03/22/2017, Normal    predniSONE (DELTASONE) 50 MG tablet Take 1 tablet (50 mg total) by  mouth daily., Starting Sun 03/22/2017, Normal         Sherlene Shams, MD 03/23/17 [phone]

## 2018-01-15 ENCOUNTER — Ambulatory Visit (HOSPITAL_COMMUNITY)
Admission: EM | Admit: 2018-01-15 | Discharge: 2018-01-15 | Disposition: A | Discharge status: Home / Self Care | Payer: Managed Care, Other (non HMO) | Attending: Internal Medicine | Admitting: Internal Medicine

## 2018-01-15 ENCOUNTER — Encounter (HOSPITAL_COMMUNITY): Payer: Self-pay | Admitting: Emergency Medicine

## 2018-01-15 DIAGNOSIS — J0100 Acute maxillary sinusitis, unspecified: Secondary | ICD-10-CM | POA: Diagnosis not present

## 2018-01-15 MED ORDER — METHYLPREDNISOLONE SODIUM SUCC 125 MG IJ SOLR
INTRAMUSCULAR | Status: AC
  Filled 2018-01-15: qty 2

## 2018-01-15 MED ORDER — BENZONATATE 100 MG PO CAPS: 100.0000 mg | ORAL_CAPSULE | Freq: Three times a day (TID) | ORAL | 0 refills | Status: DC

## 2018-01-15 MED ORDER — METHYLPREDNISOLONE SODIUM SUCC 125 MG IJ SOLR
80.0000 mg | Freq: Once | INTRAMUSCULAR | Status: AC
  Administered 2018-01-15: 80 mg via INTRAMUSCULAR

## 2018-01-15 MED ORDER — AMOXICILLIN-POT CLAVULANATE 875-125 MG PO TABS: 1.0000 | ORAL_TABLET | Freq: Two times a day (BID) | ORAL | 0 refills | Status: DC

## 2018-01-15 NOTE — ED Triage Notes (Signed)
Pt c/o sinus issues, facial pain, cough x1 month.

## 2018-01-15 NOTE — ED Provider Notes (Signed)
[place]    CSN: [id number] Arrival date & time: 01/15/18  1524     History   Chief Complaint Chief Complaint  Patient presents with  . Facial Pain    HPI Ann Woods is a 42 y.o. female.   Subjective:   Ann Woods is a 42 y.o. female who presents for evaluation of possible sinus infection. Symptoms include right ear pressure, congestion, cough described as productive of white sputum, facial pain and shortness of breath with no fever, chills, night sweats or weight loss. No sore throat, wheezing or chest pain. Onset of symptoms was 2 months ago and has been unchanged since that time. She has taken Allegra-D but stopped due to elevated blood pressure. She also tried Mucinex but states that it wasn't helpful with her symptoms. She is currently taking Claritin daily. She is drinking plenty of fluids.  Past history is significant for no history of pneumonia or bronchitis. Patient is a non-smoker.  The following portions of the patient's history were reviewed and updated as appropriate: allergies, current medications, past family history, past medical history, past social history, past surgical history and problem list.         Past Medical History:  Diagnosis Date  . Hypertension    G2 - preeclampsia, CHTN  . Obesity   . Seasonal allergies     Patient Active Problem List   Diagnosis Date Noted  . PROM (premature rupture of membranes) 05/05/2014  . Sterilization 05/05/2014  . Hepatitis B affecting pregnancy, antepartum 04/03/2014  . Chronic Hypertension in pregnancy 12/01/2013  . Hepatitis B 12/01/2013  . Supervision of high-risk pregnancy 12/01/2013  . AMA (advanced maternal age) multigravida 35+ 11/30/2013  . Previous cesarean delivery, antepartum condition or complication 02/15/7627  . ALLERGIC RHINITIS 05/31/2007  . 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 OBESITY 04/28/2007  . ANEMIA-IRON DEFICIENCY 04/28/2007  . PAP SMEAR, ABNORMAL 04/28/2007    Past  Surgical History:  Procedure Laterality Date  . CESAREAN SECTION    . HIP SURGERY    . TUBAL LIGATION Bilateral 05/05/2014   Procedure: POST PARTUM TUBAL LIGATION;  Surgeon: Donnamae Jude, MD;  Location: [facility];  Service: Gynecology;  Laterality: Bilateral;    OB History    Gravida  3   Para  3   Term  3   Preterm  0   AB  0   Living  3     SAB  0   TAB  0   Ectopic  0   Multiple  0   Live Births  3            Home Medications    Prior to Admission medications   Medication Sig Start Date End Date Taking? Authorizing Provider  loratadine (CLARITIN) 10 MG tablet Take 10 mg by mouth daily.   Yes [provider]  acetaminophen (TYLENOL) 325 MG tablet Take 650 mg by mouth every 6 (six) hours as needed.    [provider]  amoxicillin-clavulanate (AUGMENTIN) 875-125 MG tablet Take 1 tablet by mouth every 12 (twelve) hours. 01/15/18   Enrique Sack, FNP  benzonatate (TESSALON) 100 MG capsule Take 1 capsule (100 mg total) by mouth every 8 (eight) hours. 01/15/18   Enrique Sack, FNP  ibuprofen (ADVIL,MOTRIN) 600 MG tablet Take 1 tablet (600 mg total) by mouth every 6 (six) hours. 05/07/14   Seabron Spates, CNM  naproxen (NAPROSYN) 500 MG tablet Take 1 tablet (500 mg total) by mouth 2 (  two) times daily. 03/22/17   Wynona Luna, MD  predniSONE (DELTASONE) 50 MG tablet Take 1 tablet (50 mg total) by mouth daily. 03/22/17   Wynona Luna, MD  Sodium & Potassium Bicarbonate (ALKA-SELTZER GOLD PO) Take by mouth.    [provider]    Family History No family history on file.  Social History Social History   Tobacco Use  . Smoking status: Former Smoker    Last attempt to quit: 08/25/1993    Years since quitting: 24.4  . Smokeless tobacco: Never Used  Substance Use Topics  . Alcohol use: No  . Drug use: No     Allergies   Patient has no known allergies.   Review of Systems Review of Systems  Constitutional:  Negative for fever.  HENT: Positive for congestion, ear pain, rhinorrhea, sinus pressure and sinus pain. Negative for facial swelling and sore throat.   Respiratory: Positive for cough and shortness of breath. Negative for wheezing.   Cardiovascular: Negative for chest pain.  All other systems reviewed and are negative.    Physical Exam Triage Vital Signs ED Triage Vitals [01/15/18 1553]  Enc Vitals Group     BP 128/64     Pulse Rate 88     Resp 18     Temp 98.8 F (37.1 C)     Temp src      SpO2 100 %     Weight      Height      Head Circumference      Peak Flow      Pain Score      Pain Loc      Pain Edu?      Excl. in [place]?    No data found.  Updated Vital Signs BP 128/64   Pulse 88   Temp 98.8 F (37.1 C)   Resp 18   SpO2 100%   Visual Acuity Right Eye Distance:   Left Eye Distance:   Bilateral Distance:    Right Eye Near:   Left Eye Near:    Bilateral Near:     Physical Exam  Constitutional: She is oriented to person, place, and time. She appears well-developed and well-nourished.  HENT:  Head: Normocephalic and atraumatic.  Right Ear: Hearing, tympanic membrane and external ear normal.  Left Ear: Hearing, tympanic membrane and external ear normal.  Nose: No mucosal edema or sinus tenderness. Right sinus exhibits maxillary sinus tenderness. Left sinus exhibits maxillary sinus tenderness.  Mouth/Throat: Oropharynx is clear and moist. No oropharyngeal exudate.  Eyes: Pupils are equal, round, and reactive to light. Conjunctivae and EOM are normal. Right eye exhibits no discharge. Left eye exhibits no discharge.  Neck: Normal range of motion. Neck supple.  Cardiovascular: Normal rate and regular rhythm.  Pulmonary/Chest: Effort normal and breath sounds normal.  Musculoskeletal: Normal range of motion.  Lymphadenopathy:    She has cervical adenopathy.  Neurological: She is alert and oriented to person, place, and time.  Skin: Skin is warm and dry.    Psychiatric: She has a normal mood and affect.     UC Treatments / Results  Labs (all labs ordered are listed, but only abnormal results are displayed) Labs Reviewed - No data to display  EKG None  Radiology No results found.  Procedures Procedures (including critical care time)  Medications Ordered in UC Medications  methylPREDNISolone sodium succinate (SOLU-MEDROL) 125 mg/2 mL injection 80 mg (has no administration in time range)    Initial  Impression / Assessment and Plan / UC Course  I have reviewed the triage vital signs and the nursing notes.  Pertinent labs & imaging results that were available during my care of the patient were reviewed by me and considered in my medical decision making (see chart for details).    42 year old female with complaints of right ear pressure, congestion, productive cough, facial pain and shortness of breath 2 months. Vital signs stable with oxygen saturations of 100% on room air. Nontoxic appearing. Physical exam reveals maxillary sinus pressure and tenderness. All other aspects of exam unremarkable. Solu-Medrol 80 mg IM given in clinic.   Plan:  1. Zyrtec 10mg  daily  2. Augmentin 875 mp BID x 7 days  3. Tessalon Perles PRN for cough  4. Nasal saline rinses as needed for congestion. 5. Follow-up with PCP in 3 days if symptoms worsen or persist.   Evaluation has revealed no signs of a dangerous process. Patient aware of findings.  Discussed diagnosis, treatment plan, possible red flag symptoms to watch out for, and the need for close follow up. Patient understands verbal as well as written discharge instructions, is warned regarding their illness and prognosis, is able to repeat any risks, and is comfortable with plan and disposition. Has a clear mental status at this time, good insight into illness (after discussion and teaching) and has clear judgment to make decisions regarding their care.  Documentation was completed with the aid of  voice recognition software. Transcription may contain typographical errors.   Final Clinical Impressions(s) / UC Diagnoses   Final diagnoses:  Acute maxillary sinusitis, recurrence not specified   Discharge Instructions   None    ED Prescriptions    Medication Sig Dispense Auth. Provider   amoxicillin-clavulanate (AUGMENTIN) 875-125 MG tablet Take 1 tablet by mouth every 12 (twelve) hours. 14 tablet Karagan Lehr, [place], FNP   benzonatate (TESSALON) 100 MG capsule Take 1 capsule (100 mg total) by mouth every 8 (eight) hours. 21 capsule Enrique Sack, FNP     Controlled Substance Prescriptions [place] Controlled Substance Registry consulted? Not Applicable   Enrique Sack, [place] 01/15/18 [phone]

## 2018-02-08 ENCOUNTER — Encounter (HOSPITAL_COMMUNITY): Payer: Self-pay | Admitting: Emergency Medicine

## 2018-02-08 ENCOUNTER — Encounter (HOSPITAL_COMMUNITY): Payer: Self-pay

## 2018-02-08 ENCOUNTER — Observation Stay (HOSPITAL_COMMUNITY)
Admission: EM | Admit: 2018-02-08 | Discharge: 2018-02-11 | Disposition: A | Discharge status: Home / Self Care | Payer: Managed Care, Other (non HMO) | Attending: Family Medicine | Admitting: Family Medicine

## 2018-02-08 ENCOUNTER — Ambulatory Visit (HOSPITAL_COMMUNITY)
Admission: EM | Admit: 2018-02-08 | Discharge: 2018-02-08 | Disposition: A | Discharge status: Home / Self Care | Payer: Managed Care, Other (non HMO) | Source: Home / Self Care

## 2018-02-08 ENCOUNTER — Ambulatory Visit (INDEPENDENT_AMBULATORY_CARE_PROVIDER_SITE_OTHER): Payer: Managed Care, Other (non HMO)

## 2018-02-08 ENCOUNTER — Other Ambulatory Visit: Payer: Self-pay

## 2018-02-08 DIAGNOSIS — R9389 Abnormal findings on diagnostic imaging of other specified body structures: Secondary | ICD-10-CM

## 2018-02-08 DIAGNOSIS — R0902 Hypoxemia: Secondary | ICD-10-CM | POA: Diagnosis not present

## 2018-02-08 DIAGNOSIS — J189 Pneumonia, unspecified organism: Secondary | ICD-10-CM | POA: Diagnosis present

## 2018-02-08 DIAGNOSIS — Z8679 Personal history of other diseases of the circulatory system: Secondary | ICD-10-CM | POA: Diagnosis not present

## 2018-02-08 DIAGNOSIS — Z79899 Other long term (current) drug therapy: Secondary | ICD-10-CM | POA: Insufficient documentation

## 2018-02-08 DIAGNOSIS — R059 Cough, unspecified: Secondary | ICD-10-CM

## 2018-02-08 DIAGNOSIS — Z87891 Personal history of nicotine dependence: Secondary | ICD-10-CM | POA: Insufficient documentation

## 2018-02-08 DIAGNOSIS — Z6841 Body Mass Index (BMI) 40.0 and over, adult: Secondary | ICD-10-CM | POA: Diagnosis not present

## 2018-02-08 DIAGNOSIS — R05 Cough: Secondary | ICD-10-CM

## 2018-02-08 DIAGNOSIS — I10 Essential (primary) hypertension: Secondary | ICD-10-CM | POA: Diagnosis not present

## 2018-02-08 DIAGNOSIS — A419 Sepsis, unspecified organism: Secondary | ICD-10-CM | POA: Diagnosis present

## 2018-02-08 DIAGNOSIS — R0789 Other chest pain: Secondary | ICD-10-CM | POA: Diagnosis not present

## 2018-02-08 DIAGNOSIS — J302 Other seasonal allergic rhinitis: Secondary | ICD-10-CM | POA: Diagnosis not present

## 2018-02-08 DIAGNOSIS — R0602 Shortness of breath: Secondary | ICD-10-CM | POA: Diagnosis not present

## 2018-02-08 DIAGNOSIS — D509 Iron deficiency anemia, unspecified: Secondary | ICD-10-CM | POA: Diagnosis present

## 2018-02-08 DIAGNOSIS — N920 Excessive and frequent menstruation with regular cycle: Secondary | ICD-10-CM | POA: Diagnosis not present

## 2018-02-08 DIAGNOSIS — Z7952 Long term (current) use of systemic steroids: Secondary | ICD-10-CM | POA: Diagnosis not present

## 2018-02-08 LAB — CBC WITH DIFFERENTIAL/PLATELET
Abs Immature Granulocytes: 0.1 10*3/uL (ref 0.0–0.1)
BASOS ABS: 0 10*3/uL (ref 0.0–0.1)
BASOS PCT: 0 %
EOS ABS: 0.1 10*3/uL (ref 0.0–0.7)
EOS PCT: 1 %
HCT: 29.6 % — ABNORMAL LOW (ref 36.0–46.0)
Hemoglobin: 7.6 g/dL — ABNORMAL LOW (ref 12.0–15.0)
Immature Granulocytes: 1 %
Lymphocytes Relative: 12 %
Lymphs Abs: 1.4 10*3/uL (ref 0.7–4.0)
MCH: 18.5 pg — ABNORMAL LOW (ref 26.0–34.0)
MCHC: 25.7 g/dL — AB (ref 30.0–36.0)
MCV: 72.2 fL — ABNORMAL LOW (ref 78.0–100.0)
Monocytes Absolute: 1.2 10*3/uL — ABNORMAL HIGH (ref 0.1–1.0)
Monocytes Relative: 10 %
Neutro Abs: 8.8 10*3/uL — ABNORMAL HIGH (ref 1.7–7.7)
Neutrophils Relative %: 76 %
PLATELETS: 342 10*3/uL (ref 150–400)
RBC: 4.1 MIL/uL (ref 3.87–5.11)
RDW: 20 % — ABNORMAL HIGH (ref 11.5–15.5)
WBC: 11.6 10*3/uL — ABNORMAL HIGH (ref 4.0–10.5)

## 2018-02-08 LAB — COMPREHENSIVE METABOLIC PANEL
ALK PHOS: 91 U/L (ref 38–126)
ALT: 19 U/L (ref 14–54)
ANION GAP: 10 (ref 5–15)
AST: 23 U/L (ref 15–41)
Albumin: 2.9 g/dL — ABNORMAL LOW (ref 3.5–5.0)
BUN: 6 mg/dL (ref 6–20)
CALCIUM: 8.8 mg/dL — AB (ref 8.9–10.3)
CO2: 26 mmol/L (ref 22–32)
Chloride: 102 mmol/L (ref 101–111)
Creatinine, Ser: 0.93 mg/dL (ref 0.44–1.00)
GFR calc Af Amer: >60 mL/min (ref >60)
Glucose, Bld: 121 mg/dL — ABNORMAL HIGH (ref 65–99)
Potassium: 3.8 mmol/L (ref 3.5–5.1)
Sodium: 138 mmol/L (ref 135–145)
Total Bilirubin: 1.1 mg/dL (ref 0.3–1.2)
Total Protein: 7.4 g/dL (ref 6.5–8.1)

## 2018-02-08 LAB — PROTIME-INR
INR: 1.09
PROTHROMBIN TIME: 14 s (ref 11.4–15.2)

## 2018-02-08 LAB — I-STAT CG4 LACTIC ACID, ED: LACTIC ACID, VENOUS: 1.94 mmol/L — AB (ref 0.5–1.9)

## 2018-02-08 LAB — I-STAT BETA HCG BLOOD, ED (MC, WL, AP ONLY): I-stat hCG, quantitative: <5 m[IU]/mL (ref <5)

## 2018-02-08 MED ORDER — SODIUM CHLORIDE 0.9 % IV SOLN: 500.0000 mg | INTRAVENOUS | Status: DC

## 2018-02-08 MED ORDER — SODIUM CHLORIDE 0.9 % IV SOLN
2500.0000 mg | Freq: Once | INTRAVENOUS | Status: AC
  Administered 2018-02-08: 2500 mg via INTRAVENOUS
  Filled 2018-02-08: qty 2500

## 2018-02-08 MED ORDER — METHYLPREDNISOLONE SODIUM SUCC 125 MG IJ SOLR
125.0000 mg | Freq: Once | INTRAMUSCULAR | Status: AC
  Administered 2018-02-08: 125 mg via INTRAVENOUS
  Filled 2018-02-08: qty 2

## 2018-02-08 MED ORDER — ACETAMINOPHEN 325 MG PO TABS
ORAL_TABLET | ORAL | Status: AC
  Filled 2018-02-08: qty 1

## 2018-02-08 MED ORDER — VANCOMYCIN HCL IN DEXTROSE 1-5 GM/200ML-% IV SOLN: 1000.0000 mg | Freq: Once | INTRAVENOUS | Status: DC

## 2018-02-08 MED ORDER — SODIUM CHLORIDE 0.9 % IV SOLN
2.0000 g | Freq: Once | INTRAVENOUS | Status: AC
  Administered 2018-02-08: 2 g via INTRAVENOUS
  Filled 2018-02-08: qty 2

## 2018-02-08 MED ORDER — ALBUTEROL SULFATE (2.5 MG/3ML) 0.083% IN NEBU
5.0000 mg | INHALATION_SOLUTION | Freq: Once | RESPIRATORY_TRACT | Status: AC
  Administered 2018-02-08: 5 mg via RESPIRATORY_TRACT
  Filled 2018-02-08: qty 6

## 2018-02-08 MED ORDER — SODIUM CHLORIDE 0.9 % IV SOLN: 2.0000 g | INTRAVENOUS | Status: DC

## 2018-02-08 MED ORDER — ACETAMINOPHEN 325 MG PO TABS
650.0000 mg | ORAL_TABLET | Freq: Once | ORAL | Status: AC
  Administered 2018-02-08: 650 mg via ORAL

## 2018-02-08 MED ORDER — SODIUM CHLORIDE 0.9 % IV BOLUS
3000.0000 mL | Freq: Once | INTRAVENOUS | Status: AC
  Administered 2018-02-08: 3000 mL via INTRAVENOUS

## 2018-02-08 NOTE — ED Triage Notes (Signed)
Sent by [facility] for further eval of SOB X3 days with cough and wheezing. Pt was started on amoxicillin and given tessalon 3 days ago which has not helped. Pt noted to be febrile and tachycardic in triage, CXR @ [place] concerning for PNA.

## 2018-02-08 NOTE — ED Notes (Signed)
Pt given Tylenol at [facility] for fever

## 2018-02-08 NOTE — ED Provider Notes (Signed)
MRN: [id number] DOB: 05-12-1976  Subjective:   Ann Woods is a 42 y.o. female presenting for 72-month history  of persistent productive cough, shortness of breath, now having chest pain which she rates as mild in nature.  Her last office visit for the same was on 01/15/2018, was started on Augmentin for sinusitis and given 80 mg of Solu-Medrol.  Patient was not compliant with her Augmentin.  Reports that she has not had any improvement since then.  She did restart the Augmentin however.  She does admit that her sinus congestion is somewhat improved but still persisting.  No current facility-administered medications for this encounter.   Current Outpatient Medications:  .  acetaminophen (TYLENOL) 325 MG tablet, Take 650 mg by mouth every 6 (six) hours as needed., Disp: , Rfl:  .  amoxicillin-clavulanate (AUGMENTIN) 875-125 MG tablet, Take 1 tablet by mouth every 12 (twelve) hours., Disp: 14 tablet, Rfl: 0 .  benzonatate (TESSALON) 100 MG capsule, Take 1 capsule (100 mg total) by mouth every 8 (eight) hours., Disp: 21 capsule, Rfl: 0 .  loratadine (CLARITIN) 10 MG tablet, Take 10 mg by mouth daily., Disp: , Rfl:  .  ibuprofen (ADVIL,MOTRIN) 600 MG tablet, Take 1 tablet (600 mg total) by mouth every 6 (six) hours., Disp: 30 tablet, Rfl: 0 .  naproxen (NAPROSYN) 500 MG tablet, Take 1 tablet (500 mg total) by mouth 2 (two) times daily., Disp: 30 tablet, Rfl: 0 .  predniSONE (DELTASONE) 50 MG tablet, Take 1 tablet (50 mg total) by mouth daily., Disp: 3 tablet, Rfl: 0 .  Sodium & Potassium Bicarbonate (ALKA-SELTZER GOLD PO), Take by mouth., Disp: , Rfl:    No Known Allergies  Past Medical History:  Diagnosis Date  . Hypertension    G2 - preeclampsia, CHTN  . Obesity   . Seasonal allergies      Past Surgical History:  Procedure Laterality Date  . CESAREAN SECTION    . HIP SURGERY    . TUBAL LIGATION Bilateral 05/05/2014   Procedure: POST PARTUM TUBAL LIGATION;  Surgeon: Donnamae Jude,  MD;  Location: [facility];  Service: Gynecology;  Laterality: Bilateral;    Objective:   Vitals: BP 123/78 (BP Location: Right Arm)   Pulse (!) 109   Temp (!) 103 F (39.4 C) (Oral)   Resp 18   LMP 01/17/2018 (Exact Date)   SpO2 92%   Physical Exam  Constitutional: She is oriented to person, place, and time. She appears well-developed and well-nourished.  Body habitus is morbidly obese.  Eyes: Right eye exhibits no discharge. Left eye exhibits no discharge. No scleral icterus.  Cardiovascular: Normal rate, regular rhythm and intact distal pulses. Exam reveals no gallop and no friction rub.  No murmur heard. Pulmonary/Chest: No respiratory distress. She has no wheezes. She has no rales.  Coarse lung sound in mid-lower lung fields.  Neurological: She is alert and oriented to person, place, and time.  Skin: Skin is warm and dry.   Dg Chest 2 View  Result Date: 02/08/2018 CLINICAL DATA:  Cough and shortness of breath for 1 week. EXAM: CHEST - 2 VIEW COMPARISON:  07/17/2014. FINDINGS: The heart is enlarged. There are BILATERAL pulmonary opacities which could represent pneumonia or pulmonary edema. No acute osseous findings. IMPRESSION: Cardiomegaly with BILATERAL pulmonary opacities which could represent pneumonia or pulmonary edema.Marked worsening aeration when compared with priors. Electronically Signed   By: Staci Righter M.D.   On: 02/08/2018 20:17   Assessment and Plan :  Shortness of breath  Hypoxemia  Atypical chest pain  Cough  Abnormal chest x-ray  History of hypertension  Morbid obesity ([place])  Will refer patient to ER immediately given possibility of respiratory failure given 92% pulse oximetry in setting of persistent productive cough, chest pain, shortness of breath for the past 2 months with an x-ray that shows bilateral pulmonary opacities representing possible pneumonia versus pulmonary edema.

## 2018-02-08 NOTE — ED Notes (Signed)
Pt ambulated in hall. SpO2 remained 96-97 RA. MD aware

## 2018-02-08 NOTE — Discharge Instructions (Addendum)
Please report to [facility] ER immediately so that you can get an emergent evaluation to rule out pneumonia versus heart failure versus other cardiopulmonary emergency including respiratory failure.

## 2018-02-08 NOTE — ED Provider Notes (Addendum)
[facility] EMERGENCY DEPARTMENT Provider Note   CSN: [id number] Arrival date & time: 02/08/18  2122     History   Chief Complaint Chief Complaint  Patient presents with  . Shortness of Breath    HPI KESHONA KARTES is a 42 y.o. female.   42 year old female with a history of hypertension, seasonal allergies, obesity presents to the emergency department from urgent care.  She reports worsening shortness of breath over the past 3 days.  Symptoms associated with nasal congestion, postnasal drip, nonproductive cough for the past 3 weeks.  She was started on amoxicillin 3 weeks ago and reports finishing this course of antibiotics.  She was also given Tessalon Perles for cough, but this provided little relief to her symptoms.  She has been supplementing with Robitussin as needed.  She has had increasing shortness of breath with exertion prompting her to be reevaluated today.  She had oxygen saturations of 92% as well as tachycardia and fever of 103 F.  Chest x-ray completed at urgent care concerning for multifocal pneumonia.  The patient denies any hemoptysis, leg swelling, syncope, sick contacts.  She does work in a nursing home.  The patient received a nebulizer treatment in triage.  She states that this has helped her shortness of breath slightly.     Past Medical History:  Diagnosis Date  . Hypertension    G2 - preeclampsia, CHTN  . Obesity   . Seasonal allergies     Patient Active Problem List   Diagnosis Date Noted  . Pneumonia 02/09/2018  . Sterilization 05/05/2014  . Hepatitis B affecting pregnancy, antepartum 04/03/2014  . Chronic Hypertension in pregnancy 12/01/2013  . Hepatitis B 12/01/2013  . ALLERGIC RHINITIS 05/31/2007  . 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 OBESITY 04/28/2007  . ANEMIA-IRON DEFICIENCY 04/28/2007  . PAP SMEAR, ABNORMAL 04/28/2007    Past Surgical History:  Procedure Laterality Date  . CESAREAN SECTION    . HIP SURGERY    . TUBAL LIGATION  Bilateral 05/05/2014   Procedure: POST PARTUM TUBAL LIGATION;  Surgeon: Donnamae Jude, MD;  Location: [facility];  Service: Gynecology;  Laterality: Bilateral;     OB History    Gravida  3   Para  3   Term  3   Preterm  0   AB  0   Living  3     SAB  0   TAB  0   Ectopic  0   Multiple  0   Live Births  3            Home Medications    Prior to Admission medications   Medication Sig Start Date End Date Taking? Authorizing Provider  acetaminophen (TYLENOL) 325 MG tablet Take 650 mg by mouth every 6 (six) hours as needed.   Yes [provider]  loratadine (CLARITIN) 10 MG tablet Take 10 mg by mouth daily.   Yes [provider]  amoxicillin-clavulanate (AUGMENTIN) 875-125 MG tablet Take 1 tablet by mouth every 12 (twelve) hours. Patient not taking: Reported on 02/08/2018 01/15/18   Enrique Sack, FNP  benzonatate (TESSALON) 100 MG capsule Take 1 capsule (100 mg total) by mouth every 8 (eight) hours. Patient not taking: Reported on 02/08/2018 01/15/18   Enrique Sack, FNP  ibuprofen (ADVIL,MOTRIN) 600 MG tablet Take 1 tablet (600 mg total) by mouth every 6 (six) hours. Patient not taking: Reported on 02/08/2018 05/07/14   Seabron Spates, CNM  naproxen (NAPROSYN) 500 MG tablet Take  1 tablet (500 mg total) by mouth 2 (two) times daily. Patient not taking: Reported on 02/08/2018 03/22/17   Wynona Luna, MD  predniSONE (DELTASONE) 50 MG tablet Take 1 tablet (50 mg total) by mouth daily. Patient not taking: Reported on 02/08/2018 03/22/17   Wynona Luna, MD    Family History History reviewed. No pertinent family history.  Social History Social History   Tobacco Use  . Smoking status: Former Smoker    Last attempt to quit: 08/25/1993    Years since quitting: 24.4  . Smokeless tobacco: Never Used  Substance Use Topics  . Alcohol use: No  . Drug use: No     Allergies   Patient has no known allergies.   Review of Systems Review  of Systems Ten systems reviewed and are negative for acute change, except as noted in the HPI.    Physical Exam Updated Vital Signs BP 112/66   Pulse (!) 105   Temp (!) 102.6 F (39.2 C) (Oral)   Resp (!) 29   Ht 5\' 6"  (1.676 m)   Wt (!) 152 kg (335 lb)   LMP 01/17/2018 (Exact Date)   SpO2 96%   BMI 54.07 kg/m   Physical Exam  Constitutional: She is oriented to person, place, and time. She appears well-developed and well-nourished. No distress.  Morbidly obese. Pleasant. Nontoxic.   HENT:  Head: Normocephalic and atraumatic.  Eyes: Conjunctivae and EOM are normal. No scleral icterus.  Neck: Normal range of motion.  Cardiovascular: Regular rhythm and intact distal pulses.  Tachycardia 118bpm  Pulmonary/Chest: Effort normal. No stridor. No respiratory distress.  Appears dyspneic with prolonged speech. Chest expansion symmetric. SpO2 96% on room air. Decreased breath sounds in bilateral bases.  Musculoskeletal: Normal range of motion.  Neurological: She is alert and oriented to person, place, and time. She exhibits normal muscle tone. Coordination normal.  GCS 15. Moving all extremities spontaneously.  Skin: Skin is warm and dry. No rash noted. She is not diaphoretic. No erythema. No pallor.  Psychiatric: She has a normal mood and affect. Her behavior is normal.  Nursing note and vitals reviewed.    ED Treatments / Results  Labs (all labs ordered are listed, but only abnormal results are displayed) Labs Reviewed  COMPREHENSIVE METABOLIC PANEL - Abnormal; Notable for the following components:      Result Value   Glucose, Bld 121 (*)    Calcium 8.8 (*)    Albumin 2.9 (*)    All other components within normal limits  CBC WITH DIFFERENTIAL/PLATELET - Abnormal; Notable for the following components:   WBC 11.6 (*)    Hemoglobin 7.6 (*)    HCT 29.6 (*)    MCV 72.2 (*)    MCH 18.5 (*)    MCHC 25.7 (*)    RDW 20.0 (*)    Neutro Abs 8.8 (*)    Monocytes Absolute 1.2 (*)      All other components within normal limits  URINALYSIS, ROUTINE W REFLEX MICROSCOPIC - Abnormal; Notable for the following components:   Color, Urine AMBER (*)    APPearance HAZY (*)    Bilirubin Urine SMALL (*)    Protein, ur 30 (*)    All other components within normal limits  I-STAT CG4 LACTIC ACID, ED - Abnormal; Notable for the following components:   Lactic Acid, Venous 1.94 (*)    All other components within normal limits  CULTURE, BLOOD (ROUTINE X 2)  CULTURE, BLOOD (ROUTINE X 2)  PROTIME-INR  I-STAT BETA HCG BLOOD, ED ([facility], [facility], [facility] ONLY)  I-STAT CG4 LACTIC ACID, ED    EKG EKG Interpretation  Date/Time:  Monday February 08 2018 21:33:05 EDT Ventricular Rate:  116 PR Interval:  142 QRS Duration: 76 QT Interval:  326 QTC Calculation: 453 R Axis:   89 Text Interpretation:  Sinus tachycardia Otherwise normal ECG Confirmed by Lajean Saver [phone]) on 02/08/2018 10:10:43 PM   Radiology Dg Chest 2 View  Result Date: 02/08/2018 CLINICAL DATA:  Cough and shortness of breath for 1 week. EXAM: CHEST - 2 VIEW COMPARISON:  07/17/2014. FINDINGS: The heart is enlarged. There are BILATERAL pulmonary opacities which could represent pneumonia or pulmonary edema. No acute osseous findings. IMPRESSION: Cardiomegaly with BILATERAL pulmonary opacities which could represent pneumonia or pulmonary edema.Marked worsening aeration when compared with priors. Electronically Signed   By: Staci Righter M.D.   On: 02/08/2018 20:17    Procedures Procedures (including critical care time)  Medications Ordered in ED Medications  vancomycin (VANCOCIN) 2,500 mg in sodium chloride 0.9 % 500 mL IVPB (2,500 mg Intravenous New Bag/Given 02/08/18 2336)  albuterol (PROVENTIL) (2.5 MG/3ML) 0.083% nebulizer solution 5 mg (5 mg Nebulization Given 02/08/18 2152)  sodium chloride 0.9 % bolus 3,000 mL (3,000 mLs Intravenous New Bag/Given 02/08/18 2248)  ceFEPIme (MAXIPIME) 2 g in sodium chloride 0.9 % 100 mL IVPB (0 g  Intravenous Stopped 02/08/18 2337)  methylPREDNISolone sodium succinate (SOLU-MEDROL) 125 mg/2 mL injection 125 mg (125 mg Intravenous Given 02/08/18 2340)    CRITICAL CARE Performed by: Antonietta Breach   Total critical care time: 35 minutes  Critical care time was exclusive of separately billable procedures and treating other patients.  Critical care was necessary to treat or prevent imminent or life-threatening deterioration.  Critical care was time spent personally by me on the following activities: development of treatment plan with patient and/or surrogate as well as nursing, discussions with consultants, evaluation of patient's response to treatment, examination of patient, obtaining history from patient or surrogate, ordering and performing treatments and interventions, ordering and review of laboratory studies, ordering and review of radiographic studies, pulse oximetry and re-evaluation of patient's condition.   Initial Impression / Assessment and Plan / ED Course  I have reviewed the triage vital signs and the nursing notes.  Pertinent labs & imaging results that were available during my care of the patient were reviewed by me and considered in my medical decision making (see chart for details).     42 year old female presents to the emergency department for worsening shortness of breath over the past 3 days.  Symptoms preceded by upper respiratory complaints including congestion and cough.  She was previously put on a course of amoxicillin for URI 3 weeks ago.  Reports no improvement with this antibiotic.  The patient works in a nursing home.  While the patient has not been hypoxic since she arrived in the ED, she does appear dyspneic with prolonged speech.  Vitals confirmed tachypnea.  Tachycardia noted in the setting of febrile illness.  Maximum temperature 103 F.  She was given Tylenol at urgent care prior to transfer.  Chest x-ray at this facility concerning for multifocal  pneumonia.  The patient has been managed in the ED with a nebulizer treatment.  This improved her shortness of breath.  Also given a dose of steroids.  She has been started on antibiotics for coverage of nosocomial pneumonia in light of her occupation.  Plan for admission for continued management.  Case  discussed with Dr. Myna Hidalgo who will admit.  Vitals:   02/08/18 2130 02/08/18 2136 02/08/18 2315  BP: (!) 114/55  112/66  Pulse: (!) 119  (!) 105  Resp: (!) 26  (!) 29  Temp: (!) 102.6 F (39.2 C)    TempSrc: Oral    SpO2: 92%  96%  Weight:  (!) 152 kg (335 lb)   Height:  5\' 6"  (1.676 m)     Final Clinical Impressions(s) / ED Diagnoses   Final diagnoses:  Multifocal pneumonia  Sepsis, due to unspecified organism [facility])    ED Discharge Orders    None       Antonietta Breach, PA-C 02/09/18 0101    Antonietta Breach, PA-C 02/09/18 0101    Orpah Greek, MD 02/09/18 [phone]

## 2018-02-08 NOTE — ED Triage Notes (Signed)
Patient presents to [facility] for SOB x3 days, pt has taken robitussin and amoxicillin for cough. Pt also complains of wheezing and crackles

## 2018-02-09 ENCOUNTER — Encounter (HOSPITAL_COMMUNITY): Payer: Self-pay | Admitting: Family Medicine

## 2018-02-09 ENCOUNTER — Other Ambulatory Visit: Payer: Self-pay

## 2018-02-09 DIAGNOSIS — D509 Iron deficiency anemia, unspecified: Secondary | ICD-10-CM | POA: Diagnosis not present

## 2018-02-09 DIAGNOSIS — A419 Sepsis, unspecified organism: Principal | ICD-10-CM

## 2018-02-09 DIAGNOSIS — J189 Pneumonia, unspecified organism: Secondary | ICD-10-CM | POA: Diagnosis not present

## 2018-02-09 LAB — STREP PNEUMONIAE URINARY ANTIGEN: STREP PNEUMO URINARY ANTIGEN: NEGATIVE

## 2018-02-09 LAB — URINALYSIS, ROUTINE W REFLEX MICROSCOPIC
Bacteria, UA: NONE SEEN
Glucose, UA: NEGATIVE mg/dL
Hgb urine dipstick: NEGATIVE
Ketones, ur: NEGATIVE mg/dL
Leukocytes, UA: NEGATIVE
Nitrite: NEGATIVE
Protein, ur: 30 mg/dL — AB
Specific Gravity, Urine: 1.03 (ref 1.005–1.030)
pH: 5 (ref 5.0–8.0)

## 2018-02-09 LAB — CBC WITH DIFFERENTIAL/PLATELET
BASOS ABS: 0 10*3/uL (ref 0.0–0.1)
Basophils Relative: 0 %
EOS ABS: 0 10*3/uL (ref 0.0–0.7)
Eosinophils Relative: 0 %
HCT: 27.4 % — ABNORMAL LOW (ref 36.0–46.0)
HEMOGLOBIN: 7.3 g/dL — AB (ref 12.0–15.0)
LYMPHS PCT: 7 %
Lymphs Abs: 0.8 10*3/uL (ref 0.7–4.0)
MCH: 18.8 pg — ABNORMAL LOW (ref 26.0–34.0)
MCHC: 26.6 g/dL — AB (ref 30.0–36.0)
MCV: 70.6 fL — ABNORMAL LOW (ref 78.0–100.0)
Monocytes Absolute: 0.6 10*3/uL (ref 0.1–1.0)
Monocytes Relative: 5 %
NEUTROS ABS: 10.2 10*3/uL — AB (ref 1.7–7.7)
Neutrophils Relative %: 88 %
Platelets: 318 10*3/uL (ref 150–400)
RBC: 3.88 MIL/uL (ref 3.87–5.11)
RDW: 21.2 % — AB (ref 11.5–15.5)
WBC: 11.6 10*3/uL — AB (ref 4.0–10.5)

## 2018-02-09 LAB — RETICULOCYTES
RBC.: 3.88 MIL/uL (ref 3.87–5.11)
RETIC COUNT ABSOLUTE: 81.5 10*3/uL (ref 19.0–186.0)
Retic Ct Pct: 2.1 % (ref 0.4–3.1)

## 2018-02-09 LAB — BASIC METABOLIC PANEL
ANION GAP: 8 (ref 5–15)
BUN: 7 mg/dL (ref 6–20)
CALCIUM: 8.3 mg/dL — AB (ref 8.9–10.3)
CO2: 25 mmol/L (ref 22–32)
Chloride: 102 mmol/L (ref 101–111)
Creatinine, Ser: 0.83 mg/dL (ref 0.44–1.00)
Glucose, Bld: 128 mg/dL — ABNORMAL HIGH (ref 65–99)
Potassium: 4.4 mmol/L (ref 3.5–5.1)
SODIUM: 135 mmol/L (ref 135–145)

## 2018-02-09 LAB — HIV ANTIBODY (ROUTINE TESTING W REFLEX): HIV SCREEN 4TH GENERATION: NONREACTIVE

## 2018-02-09 LAB — VITAMIN B12: VITAMIN B 12: 262 pg/mL (ref 180–914)

## 2018-02-09 LAB — I-STAT CG4 LACTIC ACID, ED: Lactic Acid, Venous: 1.29 mmol/L (ref 0.5–1.9)

## 2018-02-09 LAB — IRON AND TIBC
Iron: 17 ug/dL — ABNORMAL LOW (ref 28–170)
Saturation Ratios: 4 % — ABNORMAL LOW (ref 10.4–31.8)
TIBC: 465 ug/dL — ABNORMAL HIGH (ref 250–450)
UIBC: 448 ug/dL

## 2018-02-09 LAB — FOLATE: FOLATE: 13 ng/mL (ref >5.9)

## 2018-02-09 LAB — FERRITIN: Ferritin: 22 ng/mL (ref 11–307)

## 2018-02-09 LAB — ABO/RH: ABO/RH(D): B POS

## 2018-02-09 MED ORDER — SODIUM CHLORIDE 0.9 % IV SOLN
250.0000 mL | INTRAVENOUS | Status: DC | PRN
  Administered 2018-02-10: 250 mL via INTRAVENOUS

## 2018-02-09 MED ORDER — HYDROCODONE-ACETAMINOPHEN 5-325 MG PO TABS
1.0000 | ORAL_TABLET | ORAL | Status: DC | PRN
Start: 2018-02-09 — End: 2018-02-11

## 2018-02-09 MED ORDER — GUAIFENESIN-DM 100-10 MG/5ML PO SYRP
5.0000 mL | ORAL_SOLUTION | ORAL | Status: DC | PRN
  Administered 2018-02-09 – 2018-02-10 (×2): 5 mL via ORAL
  Filled 2018-02-09 (×2): qty 5

## 2018-02-09 MED ORDER — SODIUM CHLORIDE 0.9% FLUSH
3.0000 mL | Freq: Two times a day (BID) | INTRAVENOUS | Status: DC
  Administered 2018-02-09 – 2018-02-10 (×3): 3 mL via INTRAVENOUS

## 2018-02-09 MED ORDER — ACETAMINOPHEN 325 MG PO TABS: 650.0000 mg | ORAL_TABLET | Freq: Four times a day (QID) | ORAL | Status: DC | PRN

## 2018-02-09 MED ORDER — ONDANSETRON HCL 4 MG/2ML IJ SOLN: 4.0000 mg | Freq: Four times a day (QID) | INTRAMUSCULAR | Status: DC | PRN

## 2018-02-09 MED ORDER — ALBUTEROL SULFATE (2.5 MG/3ML) 0.083% IN NEBU
2.5000 mg | INHALATION_SOLUTION | Freq: Four times a day (QID) | RESPIRATORY_TRACT | Status: DC | PRN
  Administered 2018-02-09 – 2018-02-11 (×3): 2.5 mg via RESPIRATORY_TRACT
  Filled 2018-02-09 (×3): qty 3

## 2018-02-09 MED ORDER — IPRATROPIUM-ALBUTEROL 0.5-2.5 (3) MG/3ML IN SOLN
3.0000 mL | Freq: Three times a day (TID) | RESPIRATORY_TRACT | Status: DC
  Administered 2018-02-09 – 2018-02-11 (×6): 3 mL via RESPIRATORY_TRACT
  Filled 2018-02-09 (×5): qty 3

## 2018-02-09 MED ORDER — SODIUM CHLORIDE 0.9% FLUSH: 3.0000 mL | INTRAVENOUS | Status: DC | PRN

## 2018-02-09 MED ORDER — SENNOSIDES-DOCUSATE SODIUM 8.6-50 MG PO TABS: 1.0000 | ORAL_TABLET | Freq: Every evening | ORAL | Status: DC | PRN

## 2018-02-09 MED ORDER — ACETAMINOPHEN 650 MG RE SUPP: 650.0000 mg | Freq: Four times a day (QID) | RECTAL | Status: DC | PRN

## 2018-02-09 MED ORDER — SODIUM CHLORIDE 0.9 % IV SOLN
500.0000 mg | INTRAVENOUS | Status: DC
  Administered 2018-02-09 – 2018-02-10 (×2): 500 mg via INTRAVENOUS
  Filled 2018-02-09 (×2): qty 500

## 2018-02-09 MED ORDER — ONDANSETRON HCL 4 MG PO TABS: 4.0000 mg | ORAL_TABLET | Freq: Four times a day (QID) | ORAL | Status: DC | PRN

## 2018-02-09 MED ORDER — SODIUM CHLORIDE 0.9 % IV SOLN
1.0000 g | INTRAVENOUS | Status: DC
  Administered 2018-02-09 – 2018-02-11 (×3): 1 g via INTRAVENOUS
  Filled 2018-02-09 (×4): qty 10

## 2018-02-09 NOTE — ED Notes (Signed)
ED Provider at bedside. 

## 2018-02-09 NOTE — Progress Notes (Signed)
Patient admitted earlier this morning. Please see H&P by Dr. Christia Reading Opyd for details.  42 year old female with history of obesity, presented with shortness of breath, cough and wheezing which had been ongoing for approximately a month.  Patient was given antibiotics for a sinus infection, her symptoms had mildly resolved but returned.  She has been short of breath with minimal exertion along with dizziness and having a nonproductive cough.  Chest x-ray concerning for pneumonia.  Patient presented with tachycardia, tachypnea.  States she was febrile with a fever of 103F at urgent care yesterday, 02/08/2018.  Admitted for sepsis secondary to pneumonia, started on azithromycin and ceftriaxone.  Also found to have microcytic anemia and is currently on her menstrual cycle.  Suspect patient will be able to go home within 1 to 2 days pending her improvement.  Time spent: 20 minutes  Roxann Vierra D.O. [facility] Pager [phone]  If 7PM-7AM, please contact night-coverage [email or web address] Password [facility] 02/09/2018, 12:18 PM

## 2018-02-09 NOTE — ED Notes (Signed)
ED TO INPATIENT HANDOFF REPORT  Name/Age/Gender Ann Woods 42 y.o. female  Code Status    Code Status Orders  (From admission, onward)        Start     Ordered   02/09/18 0108  Full code  Continuous     02/09/18 0109    Code Status History    Date Active Date Inactive Code Status Order ID Comments User Context   05/05/2014 2043 05/07/2014 1521 Full Code [id number]  Myrtis Ser, [facility] Inpatient   05/05/2014 0516 05/05/2014 2037 Full Code [id number]  Mariel Sleet, MD Inpatient      Home/SNF/Other Home  Chief Complaint SOB  Level of Care/Admitting Diagnosis ED Disposition    ED Disposition Condition [place]: [facility] [100100]  Level of Care: Med-Surg [16]  Diagnosis: Pneumonia [952841]  Admitting Physician: Vianne Bulls [[id number]]  Attending Physician: Vianne Bulls [[id number]]  PT Class (Do Not Modify): Observation [104]  PT Acc Code (Do Not Modify): Observation [10022]       Medical History Past Medical History:  Diagnosis Date  . Hypertension    G2 - preeclampsia, CHTN  . Obesity   . Seasonal allergies     Allergies No Known Allergies  IV Location/Drains/Wounds Patient Lines/Drains/Airways Status   Active Line/Drains/Airways    Name:   Placement date:   Placement time:   Site:   Days:   Peripheral IV 02/08/18 Left Hand   02/08/18    2153    Hand   1   Peripheral IV 02/09/18 Right Antecubital   02/09/18    0151    Antecubital   less than 1   Incision (Closed) 05/05/14 Perineum Other (Comment)   05/05/14    1802     1376   Incision (Closed) 05/05/14 Abdomen Other (Comment)   05/05/14    1802     1376          Labs/Imaging Results for orders placed or performed during the hospital encounter of 02/08/18 (from the past 48 hour(s))  Comprehensive metabolic panel     Status: Abnormal   Collection Time: 02/08/18  9:50 PM  Result Value Ref Range   Sodium 138 135 - 145 mmol/L   Potassium 3.8 3.5 - 5.1  mmol/L   Chloride 102 101 - 111 mmol/L   CO2 26 22 - 32 mmol/L   Glucose, Bld 121 (H) 65 - 99 mg/dL   BUN 6 6 - 20 mg/dL   Creatinine, Ser 0.93 0.44 - 1.00 mg/dL   Calcium 8.8 (L) 8.9 - 10.3 mg/dL   Total Protein 7.4 6.5 - 8.1 g/dL   Albumin 2.9 (L) 3.5 - 5.0 g/dL   AST 23 15 - 41 U/L   ALT 19 14 - 54 U/L   Alkaline Phosphatase 91 38 - 126 U/L   Total Bilirubin 1.1 0.3 - 1.2 mg/dL   GFR calc non Af Amer >60 >60 mL/min   GFR calc Af Amer >60 >60 mL/min    Comment: (NOTE) The eGFR has been calculated using the CKD EPI equation. This calculation has not been validated in all clinical situations. eGFR's persistently <60 mL/min signify possible Chronic Kidney Disease.    Anion gap 10 5 - 15    Comment: Performed at [facility] [address], [place], [place] [address]  CBC with Differential     Status: Abnormal   Collection Time: 02/08/18  9:50 PM  Result Value Ref Range   WBC 11.6 (H) 4.0 - 10.5 K/uL   RBC 4.10 3.87 - 5.11 MIL/uL   Hemoglobin 7.6 (L) 12.0 - 15.0 g/dL   HCT 29.6 (L) 36.0 - 46.0 %   MCV 72.2 (L) 78.0 - 100.0 fL   MCH 18.5 (L) 26.0 - 34.0 pg   MCHC 25.7 (L) 30.0 - 36.0 g/dL   RDW 20.0 (H) 11.5 - 15.5 %   Platelets 342 150 - 400 K/uL   Neutrophils Relative % 76 %   Neutro Abs 8.8 (H) 1.7 - 7.7 K/uL   Lymphocytes Relative 12 %   Lymphs Abs 1.4 0.7 - 4.0 K/uL   Monocytes Relative 10 %   Monocytes Absolute 1.2 (H) 0.1 - 1.0 K/uL   Eosinophils Relative 1 %   Eosinophils Absolute 0.1 0.0 - 0.7 K/uL   Basophils Relative 0 %   Basophils Absolute 0.0 0.0 - 0.1 K/uL   Immature Granulocytes 1 %   Abs Immature Granulocytes 0.1 0.0 - 0.1 K/uL    Comment: Performed at [facility], [address] [place], [place] [address]  Protime-INR     Status: None   Collection Time: 02/08/18  9:50 PM  Result Value Ref Range   Prothrombin Time 14.0 11.4 - 15.2 seconds   INR 1.09     Comment: Performed at [facility] [address], [place], [place]  [address]  I-Stat beta hCG blood, ED     Status: None   Collection Time: 02/08/18 10:08 PM  Result Value Ref Range   I-stat hCG, quantitative <5.0 <5 mIU/mL   Comment 3            Comment:   GEST. AGE      CONC.  (mIU/mL)   <=1 WEEK        5 - 50     2 WEEKS       50 - 500     3 WEEKS       100 - 10,000     4 WEEKS     1,000 - 30,000        FEMALE AND NON-PREGNANT FEMALE:     LESS THAN 5 mIU/mL   I-Stat CG4 Lactic Acid, ED     Status: Abnormal   Collection Time: 02/08/18 10:51 PM  Result Value Ref Range   Lactic Acid, Venous 1.94 (H) 0.5 - 1.9 mmol/L  Urinalysis, Routine w reflex microscopic     Status: Abnormal   Collection Time: 02/08/18 11:56 PM  Result Value Ref Range   Color, Urine AMBER (A) YELLOW    Comment: BIOCHEMICALS MAY BE AFFECTED BY COLOR   APPearance HAZY (A) CLEAR   Specific Gravity, Urine 1.030 1.005 - 1.030   pH 5.0 5.0 - 8.0   Glucose, UA NEGATIVE NEGATIVE mg/dL   Hgb urine dipstick NEGATIVE NEGATIVE   Bilirubin Urine SMALL (A) NEGATIVE   Ketones, ur NEGATIVE NEGATIVE mg/dL   Protein, ur 30 (A) NEGATIVE mg/dL   Nitrite NEGATIVE NEGATIVE   Leukocytes, UA NEGATIVE NEGATIVE   RBC / HPF 0-5 0 - 5 RBC/hpf   WBC, UA 0-5 0 - 5 WBC/hpf   Bacteria, UA NONE SEEN NONE SEEN   Squamous Epithelial / LPF 0-5 0 - 5   Mucus PRESENT     Comment: Performed at [facility], [address], [place], [place] [address]  I-Stat CG4 Lactic Acid, ED     Status: None  Collection Time: 02/09/18 12:13 AM  Result Value Ref Range   Lactic Acid, Venous 1.29 0.5 - 1.9 mmol/L  Basic metabolic panel     Status: Abnormal   Collection Time: 02/09/18  1:10 AM  Result Value Ref Range   Sodium 135 135 - 145 mmol/L   Potassium 4.4 3.5 - 5.1 mmol/L   Chloride 102 101 - 111 mmol/L   CO2 25 22 - 32 mmol/L   Glucose, Bld 128 (H) 65 - 99 mg/dL   BUN 7 6 - 20 mg/dL   Creatinine, Ser 0.83 0.44 - 1.00 mg/dL   Calcium 8.3 (L) 8.9 - 10.3 mg/dL   GFR calc non Af Amer >60 >60 mL/min   GFR calc  Af Amer >60 >60 mL/min    Comment: (NOTE) The eGFR has been calculated using the CKD EPI equation. This calculation has not been validated in all clinical situations. eGFR's persistently <60 mL/min signify possible Chronic Kidney Disease.    Anion gap 8 5 - 15    Comment: Performed at [facility] [address], [place], [place] [address]  CBC WITH DIFFERENTIAL     Status: Abnormal   Collection Time: 02/09/18  1:10 AM  Result Value Ref Range   WBC 11.6 (H) 4.0 - 10.5 K/uL   RBC 3.88 3.87 - 5.11 MIL/uL   Hemoglobin 7.3 (L) 12.0 - 15.0 g/dL   HCT 27.4 (L) 36.0 - 46.0 %   MCV 70.6 (L) 78.0 - 100.0 fL   MCH 18.8 (L) 26.0 - 34.0 pg   MCHC 26.6 (L) 30.0 - 36.0 g/dL   RDW 21.2 (H) 11.5 - 15.5 %   Platelets 318 150 - 400 K/uL    Comment: REPEATED TO VERIFY WHITE COUNT CONFIRMED ON SMEAR    Neutrophils Relative % 88 %   Lymphocytes Relative 7 %   Monocytes Relative 5 %   Eosinophils Relative 0 %   Basophils Relative 0 %   Neutro Abs 10.2 (H) 1.7 - 7.7 K/uL   Lymphs Abs 0.8 0.7 - 4.0 K/uL   Monocytes Absolute 0.6 0.1 - 1.0 K/uL   Eosinophils Absolute 0.0 0.0 - 0.7 K/uL   Basophils Absolute 0.0 0.0 - 0.1 K/uL   RBC Morphology POLYCHROMASIA PRESENT     Comment: MIXED RBC POPULATION Performed at [facility] [address], [place], [place] [address]   Vitamin B12     Status: None   Collection Time: 02/09/18  1:10 AM  Result Value Ref Range   [name] 262 180 - 914 pg/mL    Comment: (NOTE) This assay is not validated for testing neonatal or myeloproliferative syndrome specimens for Vitamin B12 levels. Performed at [facility], [place] [address] [place], [place] [address]   Folate     Status: None   Collection Time: 02/09/18  1:10 AM  Result Value Ref Range   Folate 13.0 >5.9 ng/mL    Comment: Performed at [facility] [address], [place], [place] [address]  Iron and TIBC     Status: Abnormal   Collection Time: 02/09/18  1:10 AM  Result  Value Ref Range   Iron 17 (L) 28 - 170 ug/dL   TIBC 465 (H) 250 - 450 ug/dL   Saturation Ratios 4 (L) 10.4 - 31.8 %   UIBC 448 ug/dL    Comment: Performed at [facility], [place] [address] [place], [place] [address]  Ferritin  Status: None   Collection Time: 02/09/18  1:10 AM  Result Value Ref Range   Ferritin 22 11 - 307 ng/mL    Comment: Performed at [facility], [place] [address] [place], [place] [address]  Reticulocytes     Status: None   Collection Time: 02/09/18  1:10 AM  Result Value Ref Range   Retic Ct Pct 2.1 0.4 - 3.1 %   RBC. 3.88 3.87 - 5.11 MIL/uL   Retic Count, Absolute 81.5 19.0 - 186.0 K/uL    Comment: Performed at [facility] [address] [place], [place] [address]  Strep pneumoniae urinary antigen     Status: None   Collection Time: 02/09/18  1:28 AM  Result Value Ref Range   Strep Pneumo Urinary Antigen NEGATIVE NEGATIVE    Comment:        Infection due to S. pneumoniae cannot be absolutely ruled out since the antigen present may be below the detection limit of the test. Performed at [facility], [address], [place], [place] [address]   Type and screen [place]     Status: None   Collection Time: 02/09/18  1:47 AM  Result Value Ref Range   ABO/RH(D) B POS    Antibody Screen NEG    Sample Expiration      02/12/2018 Performed at [facility], [place] [address] [place], [place] [address]   ABO/Rh     Status: None (Preliminary result)   Collection Time: 02/09/18  1:47 AM  Result Value Ref Range   ABO/RH(D)      B POS Performed at [facility] [address] [place], [place] [address]    Dg Chest 2 View  Result Date: 02/08/2018 CLINICAL DATA:  Cough and shortness of breath for 1 week. EXAM: CHEST - 2 VIEW COMPARISON:  07/17/2014. FINDINGS: The heart is enlarged. There are BILATERAL pulmonary opacities which could represent pneumonia or pulmonary edema. No acute osseous findings.  IMPRESSION: Cardiomegaly with BILATERAL pulmonary opacities which could represent pneumonia or pulmonary edema.Marked worsening aeration when compared with priors. Electronically Signed   By: Staci Righter M.D.   On: 02/08/2018 20:17    Pending Labs Unresulted Labs (From admission, onward)   Start     Ordered   02/09/18 0500  HIV antibody (Routine Testing)  Tomorrow morning,   R     02/09/18 0109   02/09/18 0109  Legionella Pneumophila Serogp 1 Ur Ag  Once,   R     02/09/18 0109   02/09/18 0107  Culture, sputum-assessment  Once,   R    Question:  Patient immune status  Answer:  Normal   02/09/18 0109   02/09/18 0107  Gram stain  Once,   R    Question:  Patient immune status  Answer:  Normal   02/09/18 0109   02/08/18 2137  Culture, blood (Routine x 2)  BLOOD CULTURE X 2,   STAT     02/08/18 2136      Vitals/Pain Today's Vitals   02/09/18 0245 02/09/18 0300 02/09/18 0330 02/09/18 0409  BP: 108/63 99/75 130/82 128/78  Pulse: (!) 102 96 (!) 102 95  Resp: (!) 26 19 (!) 22 20  Temp:    99.7 F (37.6 C)  TempSrc:    Oral  SpO2: 95% 94% 95% 95%  Weight:      Height:      PainSc:  Isolation Precautions No active isolations  Medications Medications  sodium chloride flush (NS) 0.9 % injection 3 mL (3 mLs Intravenous Not Given 02/09/18 0117)  sodium chloride flush (NS) 0.9 % injection 3 mL (has no administration in time range)  0.9 %  sodium chloride infusion (has no administration in time range)  acetaminophen (TYLENOL) tablet 650 mg (has no administration in time range)    Or  acetaminophen (TYLENOL) suppository 650 mg (has no administration in time range)  HYDROcodone-acetaminophen (NORCO/VICODIN) 5-325 MG per tablet 1-2 tablet (has no administration in time range)  senna-docusate (Senokot-S) tablet 1 tablet (has no administration in time range)  ondansetron (ZOFRAN) tablet 4 mg (has no administration in time range)    Or  ondansetron (ZOFRAN) injection 4 mg (has no  administration in time range)  cefTRIAXone (ROCEPHIN) 1 g in sodium chloride 0.9 % 100 mL IVPB (0 g Intravenous Stopped 02/09/18 0252)  azithromycin (ZITHROMAX) 500 mg in sodium chloride 0.9 % 250 mL IVPB (0 mg Intravenous Stopped 02/09/18 0320)  albuterol (PROVENTIL) (2.5 MG/3ML) 0.083% nebulizer solution 2.5 mg (has no administration in time range)  albuterol (PROVENTIL) (2.5 MG/3ML) 0.083% nebulizer solution 5 mg (5 mg Nebulization Given 02/08/18 2152)  sodium chloride 0.9 % bolus 3,000 mL (0 mLs Intravenous Stopped 02/09/18 0342)  ceFEPIme (MAXIPIME) 2 g in sodium chloride 0.9 % 100 mL IVPB (0 g Intravenous Stopped 02/08/18 2337)  vancomycin (VANCOCIN) 2,500 mg in sodium chloride 0.9 % 500 mL IVPB (0 mg Intravenous Stopped 02/09/18 0213)  methylPREDNISolone sodium succinate (SOLU-MEDROL) 125 mg/2 mL injection 125 mg (125 mg Intravenous Given 02/08/18 2340)    Mobility walks

## 2018-02-09 NOTE — H&P (Signed)
History and Physical    Ann Woods:[id number] DOB: 06/03/1976 DOA: 02/08/2018  PCP: Patient, No Pcp Per   Patient coming from: Home   Chief Complaint: SOB, cough   HPI: Ann Woods is a 42 y.o. female with medical history significant for obesity with BMI 54, now presenting to the emergency department for evaluation of shortness of breath, cough, and wheezing.  Patient reports that she developed sinus congestion and rhinorrhea approximately 1 month ago, was started on an antibiotic for that, reports resolution in these symptoms, but then developed shortness of breath and cough approximately 3 days ago.  She reports that the dyspnea has worsened significantly over the past couple days and she has been short of breath with minimal exertion.  She describes her cough as mainly nonproductive.  She denies chest pain or palpitations.  Denies lower extremity tenderness.  She was evaluated at an urgent care where she was noted to be febrile, tachycardic, tachypneic, and with bilateral infiltrates on chest x-ray concerning for pneumonia.  She was directed to the ED for further evaluation.  ED Course: Upon arrival to the ED, patient is found to be febrile to 39.2 C, saturating low 90s on room air, tachypneic to 30, tachycardic in the 110s, and with stable blood pressure.  EKG features a sinus tachycardia with rate 116.  Chest x-ray is notable for cardiomegaly with bilateral pulmonary opacities suggestive of pneumonia versus edema.  Chemistry panel is unremarkable and CBC is notable for a leukocytosis to 11,600 and a microcytic anemia with hemoglobin 7.6.  Lactic acid was slightly elevated to 1.94 and urinalysis was unremarkable.  Blood cultures were collected and the patient was treated with 3 L of normal saline, albuterol neb, cefepime, and vancomycin in the ED.  She remains shortness of breath at rest, slightly tachypneic and tachycardic, and will be observed for ongoing evaluation and management  of pneumonia.  Review of Systems:  All other systems reviewed and apart from HPI, are negative.  Past Medical History:  Diagnosis Date  . Hypertension    G2 - preeclampsia, CHTN  . Obesity   . Seasonal allergies     Past Surgical History:  Procedure Laterality Date  . CESAREAN SECTION    . HIP SURGERY    . TUBAL LIGATION Bilateral 05/05/2014   Procedure: POST PARTUM TUBAL LIGATION;  Surgeon: Donnamae Jude, MD;  Location: [facility];  Service: Gynecology;  Laterality: Bilateral;     reports that she quit smoking about 24 years ago. She has never used smokeless tobacco. She reports that she does not drink alcohol or use drugs.  No Known Allergies  History reviewed. No pertinent family history.   Prior to Admission medications   Medication Sig Start Date End Date Taking? Authorizing Provider  acetaminophen (TYLENOL) 325 MG tablet Take 650 mg by mouth every 6 (six) hours as needed.   Yes [provider]  loratadine (CLARITIN) 10 MG tablet Take 10 mg by mouth daily.   Yes [provider]    Physical Exam: Vitals:   02/08/18 2130 02/08/18 2136 02/08/18 2315  BP: (!) 114/55  112/66  Pulse: (!) 119  (!) 105  Resp: (!) 26  (!) 29  Temp: (!) 102.6 F (39.2 C)    TempSrc: Oral    SpO2: 92%  96%  Weight:  (!) 152 kg (335 lb)   Height:  5\' 6"  (1.676 m)       Constitutional: Not in acute distress, obese Eyes:  PERTLA, lids and conjunctivae normal ENMT: Mucous membranes are moist. Posterior pharynx clear of any exudate or lesions.   Neck: normal, supple, no masses, no thyromegaly Respiratory: Tachypnea, dyspnea with speech. No accessory muscle use.  Cardiovascular: Rate ~110 and regular. Trace pretibial edema bilaterally. Abdomen: No distension, no tenderness, soft. Bowel sounds normal.  Musculoskeletal: no clubbing / cyanosis. No joint deformity upper and lower extremities.    Skin: no significant rashes, lesions, ulcers. Warm, dry, well-perfused. Neurologic:  CN 2-12 grossly intact. Sensation intact. Strength 5/5 in all 4 limbs.  Psychiatric:  Alert and oriented x 3. Pleasant and cooperative.     Labs on Admission: I have personally reviewed following labs and imaging studies  CBC: Recent Labs  Lab 02/08/18 2150  WBC 11.6*  NEUTROABS 8.8*  HGB 7.6*  HCT 29.6*  MCV 72.2*  PLT 798   Basic Metabolic Panel: Recent Labs  Lab 02/08/18 2150  NA 138  K 3.8  CL 102  CO2 26  GLUCOSE 121*  BUN 6  CREATININE 0.93  CALCIUM 8.8*   GFR: Estimated Creatinine Clearance: 121.1 mL/min (by C-G formula based on SCr of 0.93 mg/dL). Liver Function Tests: Recent Labs  Lab 02/08/18 2150  AST 23  ALT 19  ALKPHOS 91  BILITOT 1.1  PROT 7.4  ALBUMIN 2.9*   No results for input(s): LIPASE, AMYLASE in the last 168 hours. No results for input(s): AMMONIA in the last 168 hours. Coagulation Profile: Recent Labs  Lab 02/08/18 2150  INR 1.09   Cardiac Enzymes: No results for input(s): CKTOTAL, CKMB, CKMBINDEX, TROPONINI in the last 168 hours. BNP (last 3 results) No results for input(s): PROBNP in the last 8760 hours. HbA1C: No results for input(s): HGBA1C in the last 72 hours. CBG: No results for input(s): GLUCAP in the last 168 hours. Lipid Profile: No results for input(s): CHOL, HDL, LDLCALC, TRIG, CHOLHDL, LDLDIRECT in the last 72 hours. Thyroid Function Tests: No results for input(s): TSH, T4TOTAL, FREET4, T3FREE, THYROIDAB in the last 72 hours. Anemia Panel: No results for input(s): VITAMINB12, FOLATE, FERRITIN, TIBC, IRON, RETICCTPCT in the last 72 hours. Urine analysis:    Component Value Date/Time   COLORURINE AMBER (A) 02/08/2018 2356   APPEARANCEUR HAZY (A) 02/08/2018 2356   LABSPEC 1.030 02/08/2018 2356   PHURINE 5.0 02/08/2018 2356   GLUCOSEU NEGATIVE 02/08/2018 2356   HGBUR NEGATIVE 02/08/2018 2356   BILIRUBINUR SMALL (A) 02/08/2018 2356   KETONESUR NEGATIVE 02/08/2018 2356   PROTEINUR 30 (A) 02/08/2018 2356    UROBILINOGEN 0.2 05/02/2014 1128   NITRITE NEGATIVE 02/08/2018 2356   LEUKOCYTESUR NEGATIVE 02/08/2018 2356   Sepsis Labs: @LABRCNTIP (procalcitonin:4,lacticidven:4) )No results found for this or any previous visit (from the past 240 hour(s)).   Radiological Exams on Admission: Dg Chest 2 View  Result Date: 02/08/2018 CLINICAL DATA:  Cough and shortness of breath for 1 week. EXAM: CHEST - 2 VIEW COMPARISON:  07/17/2014. FINDINGS: The heart is enlarged. There are BILATERAL pulmonary opacities which could represent pneumonia or pulmonary edema. No acute osseous findings. IMPRESSION: Cardiomegaly with BILATERAL pulmonary opacities which could represent pneumonia or pulmonary edema.Marked worsening aeration when compared with priors. Electronically Signed   By: Staci Righter M.D.   On: 02/08/2018 20:17    EKG: Independently reviewed. Sinus tachycardia (rate 116).   Assessment/Plan   1. Pneumonia  - Presents with SOB, cough, fever  - Found to be febrile with leukocytosis, tachycardia, and CXR concerning for bilateral PNA  - Blood cultures collected in  ED and she was treated with vancomycin and cefepime  - Check sputum cultures and strep pneumo antigen  - Continue empiric abx with Rocephin and azithromycin; she continues to be tachycardic and tachypneic at rest and will be observed in hospital for initial phase of treatment    2. Microcytic anemia  - Hgb is 7.6 on admssion with MCV of 72.2  - Pt reports hx of menorrhagia, denies melena or hematochezia  - Suspect IDA secondary to menorrhagia  - Type and screen, check anemia panel     DVT prophylaxis: SCDs Code Status: Full  Family Communication: Discussed with patient Consults called: None Admission status: Observation    Vianne Bulls, MD [facility] Pager [phone]  If 7PM-7AM, please contact night-coverage [email or web address] Password [email or web address]  02/09/2018, 1:09 AM

## 2018-02-10 DIAGNOSIS — J189 Pneumonia, unspecified organism: Secondary | ICD-10-CM | POA: Diagnosis not present

## 2018-02-10 LAB — BASIC METABOLIC PANEL
ANION GAP: 5 (ref 5–15)
BUN: 8 mg/dL (ref 6–20)
CHLORIDE: 108 mmol/L (ref 101–111)
CO2: 28 mmol/L (ref 22–32)
Calcium: 8.3 mg/dL — ABNORMAL LOW (ref 8.9–10.3)
Creatinine, Ser: 0.76 mg/dL (ref 0.44–1.00)
GFR calc non Af Amer: >60 mL/min (ref >60)
GLUCOSE: 116 mg/dL — AB (ref 65–99)
POTASSIUM: 3.8 mmol/L (ref 3.5–5.1)
Sodium: 141 mmol/L (ref 135–145)

## 2018-02-10 LAB — HEMOGLOBIN AND HEMATOCRIT, BLOOD
HCT: 28.8 % — ABNORMAL LOW (ref 36.0–46.0)
HEMOGLOBIN: 7.2 g/dL — AB (ref 12.0–15.0)

## 2018-02-10 LAB — CBC
HEMATOCRIT: 26.7 % — AB (ref 36.0–46.0)
HEMOGLOBIN: 6.8 g/dL — AB (ref 12.0–15.0)
MCH: 18.4 pg — ABNORMAL LOW (ref 26.0–34.0)
MCHC: 25.5 g/dL — ABNORMAL LOW (ref 30.0–36.0)
MCV: 72.4 fL — AB (ref 78.0–100.0)
Platelets: 338 10*3/uL (ref 150–400)
RBC: 3.69 MIL/uL — AB (ref 3.87–5.11)
RDW: 20 % — ABNORMAL HIGH (ref 11.5–15.5)
WBC: 10.2 10*3/uL (ref 4.0–10.5)

## 2018-02-10 LAB — PREPARE RBC (CROSSMATCH)

## 2018-02-10 LAB — EXPECTORATED SPUTUM ASSESSMENT W GRAM STAIN, RFLX TO RESP C: Special Requests: NORMAL

## 2018-02-10 LAB — EXPECTORATED SPUTUM ASSESSMENT W REFEX TO RESP CULTURE

## 2018-02-10 MED ORDER — SODIUM CHLORIDE 0.9% IV SOLUTION
Freq: Once | INTRAVENOUS | Status: AC
  Administered 2018-02-10: 250 mL via INTRAVENOUS

## 2018-02-10 MED ORDER — FERROUS SULFATE 325 (65 FE) MG PO TABS
325.0000 mg | ORAL_TABLET | Freq: Every day | ORAL | Status: DC
  Administered 2018-02-11: 325 mg via ORAL

## 2018-02-10 MED ORDER — HYDROCOD POLST-CPM POLST ER 10-8 MG/5ML PO SUER
5.0000 mL | Freq: Once | ORAL | Status: AC
  Administered 2018-02-10: 5 mL via ORAL
  Filled 2018-02-10: qty 5

## 2018-02-10 MED ORDER — AZITHROMYCIN 250 MG PO TABS
500.0000 mg | ORAL_TABLET | Freq: Every day | ORAL | Status: DC
  Administered 2018-02-10: 500 mg via ORAL
  Filled 2018-02-10: qty 2

## 2018-02-10 NOTE — Progress Notes (Signed)
Notified on call provider K. Schorr of critical Hgb of 6.8.

## 2018-02-10 NOTE — Progress Notes (Addendum)
PROGRESS NOTE    Ann Woods  YPP:[id number] DOB: [birth year] DOA: 02/08/2018 PCP: Patient, No Pcp Per   Brief Narrative:  Ann Woods is Ann Woods 42 y.o. female with medical history significant for obesity with BMI 80, now presenting to the emergency department for evaluation of shortness of breath, cough, and wheezing being treated for CAP.  Assessment & Plan:   Principal Problem:   Pneumonia Active Problems:   ANEMIA-IRON DEFICIENCY   1. Pneumonia  - Presents with SOB, cough, fever  - Found to be febrile with leukocytosis, tachycardia, and CXR concerning for bilateral PNA  (vs pulm edema) - Urine strep negative.  Urine legionella pending. - Sputum cx pending - Blood cx NGTD - continue ceftriaxone/azithromycin   2. Microcytic anemia  - H/H downtrending, transfuse and follow.  No evidence of bleeding at this time. - She's not currently on her period.  - Suspect IDA secondary to menorrhagia  - Low normal B12, follow MMA - Normal folate.  Iron deficiency.    Needs PCP - care management c/s  DVT prophylaxis: SCD Code Status: full  Family Communication: none at bedside Disposition Plan: pending improvement, stable H/H   Consultants:   none  Procedures:   none  Antimicrobials:  Anti-infectives (From admission, onward)   Start     Dose/Rate Route Frequency Ordered Stop   02/10/18 2200  azithromycin (ZITHROMAX) tablet 500 mg     500 mg Oral Daily 02/10/18 0800 02/13/18 2159   02/09/18 0115  cefTRIAXone (ROCEPHIN) 1 g in sodium chloride 0.9 % 100 mL IVPB     1 g 200 mL/hr over 30 Minutes Intravenous Every 24 hours 02/09/18 0109 02/16/18 0114   02/09/18 0115  azithromycin (ZITHROMAX) 500 mg in sodium chloride 0.9 % 250 mL IVPB  Status:  Discontinued     500 mg 250 mL/hr over 60 Minutes Intravenous Every 24 hours 02/09/18 0109 02/10/18 0800   02/08/18 2300  vancomycin (VANCOCIN) 2,500 mg in sodium chloride 0.9 % 500 mL IVPB     2,500 mg 250 mL/hr over 120  Minutes Intravenous  Once 02/08/18 2221 02/09/18 0213   02/08/18 2230  ceFEPIme (MAXIPIME) 2 g in sodium chloride 0.9 % 100 mL IVPB     2 g 200 mL/hr over 30 Minutes Intravenous  Once 02/08/18 2219 02/08/18 2337   02/08/18 2230  vancomycin (VANCOCIN) IVPB 1000 mg/200 mL premix  Status:  Discontinued     1,000 mg 200 mL/hr over 60 Minutes Intravenous  Once 02/08/18 2219 02/08/18 2221   02/08/18 2215  cefTRIAXone (ROCEPHIN) 2 g in sodium chloride 0.9 % 100 mL IVPB  Status:  Discontinued     2 g 200 mL/hr over 30 Minutes Intravenous Every 24 hours 02/08/18 2212 02/08/18 2219   02/08/18 2215  azithromycin (ZITHROMAX) 500 mg in sodium chloride 0.9 % 250 mL IVPB  Status:  Discontinued     500 mg 250 mL/hr over 60 Minutes Intravenous Every 24 hours 02/08/18 2212 02/08/18 2219       Subjective: Feeling better. No complaints today. No CP, LH.  Objective: Vitals:   02/09/18 2041 02/10/18 0032 02/10/18 0802 02/10/18 0828  BP:  122/76 92/63   Pulse:  78 83   Resp:  19 (!) 23   Temp:  98 F (36.7 C) 98.5 F (36.9 C)   TempSrc:  Oral Oral   SpO2: (!) 10% 98% 96% 95%  Weight:      Height:  Intake/Output Summary (Last 24 hours) at 02/10/2018 0956 Last data filed at 02/10/2018 0120 Gross per 24 hour  Intake 103 ml  Output 800 ml  Net -697 ml   Filed Weights   02/08/18 2136 02/09/18 0600  Weight: (!) 152 kg (335 lb) (!) 190.5 kg (419 lb 15.6 oz)    Examination:  General exam: Appears calm and comfortable, obese Respiratory system: Clear to auscultation. Respiratory effort normal. Cardiovascular system: S1 & S2 heard, RRR. No JVD, murmurs, rubs, gallops or clicks. No pedal edema. Gastrointestinal system: Abdomen is nondistended, soft and nontender. No organomegaly or masses felt. Normal bowel sounds heard. Central nervous system: Alert and oriented. No focal neurological deficits. Extremities: Symmetric 5 x 5 power. Skin: No rashes, lesions or ulcers Psychiatry: Judgement  and insight appear normal. Mood & affect appropriate.     Data Reviewed: I have personally reviewed following labs and imaging studies  CBC: Recent Labs  Lab 02/08/18 2150 02/09/18 0110 02/10/18 0437  WBC 11.6* 11.6* 10.2  NEUTROABS 8.8* 10.2*  --   HGB 7.6* 7.3* 6.8*  HCT 29.6* 27.4* 26.7*  MCV 72.2* 70.6* 72.4*  PLT 342 318 607   Basic Metabolic Panel: Recent Labs  Lab 02/08/18 2150 02/09/18 0110 02/10/18 0437  NA 138 135 141  K 3.8 4.4 3.8  CL 102 102 108  CO2 26 25 28   GLUCOSE 121* 128* 116*  BUN 6 7 8   CREATININE 0.93 0.83 0.76  CALCIUM 8.8* 8.3* 8.3*   GFR: Estimated Creatinine Clearance: 165.4 mL/min (by C-G formula based on SCr of 0.76 mg/dL). Liver Function Tests: Recent Labs  Lab 02/08/18 2150  AST 23  ALT 19  ALKPHOS 91  BILITOT 1.1  PROT 7.4  ALBUMIN 2.9*   No results for input(s): LIPASE, AMYLASE in the last 168 hours. No results for input(s): AMMONIA in the last 168 hours. Coagulation Profile: Recent Labs  Lab 02/08/18 2150  INR 1.09   Cardiac Enzymes: No results for input(s): CKTOTAL, CKMB, CKMBINDEX, TROPONINI in the last 168 hours. BNP (last 3 results) No results for input(s): PROBNP in the last 8760 hours. HbA1C: No results for input(s): HGBA1C in the last 72 hours. CBG: No results for input(s): GLUCAP in the last 168 hours. Lipid Profile: No results for input(s): CHOL, HDL, LDLCALC, TRIG, CHOLHDL, LDLDIRECT in the last 72 hours. Thyroid Function Tests: No results for input(s): TSH, T4TOTAL, FREET4, T3FREE, THYROIDAB in the last 72 hours. Anemia Panel: Recent Labs    02/09/18 0110  VITAMINB12 262  FOLATE 13.0  FERRITIN 22  TIBC 465*  IRON 17*  RETICCTPCT 2.1   Sepsis Labs: Recent Labs  Lab 02/08/18 2251 02/09/18 0013  LATICACIDVEN 1.94* 1.29    Recent Results (from the past 240 hour(s))  Culture, blood (Routine x 2)     Status: None (Preliminary result)   Collection Time: 02/08/18  9:50 PM  Result Value Ref  Range Status   Specimen Description BLOOD RIGHT ARM  Final   Special Requests   Final    BOTTLES DRAWN AEROBIC AND ANAEROBIC Blood Culture results may not be optimal due to an excessive volume of blood received in culture bottles   Culture   Final    NO GROWTH < 24 HOURS Performed at [facility] [address], [place], [place] [address]    Report Status PENDING  Incomplete  Culture, blood (Routine x 2)     Status: None (Preliminary result)   Collection Time: 02/08/18  9:51  PM  Result Value Ref Range Status   Specimen Description BLOOD LEFT HAND  Final   Special Requests   Final    BOTTLES DRAWN AEROBIC AND ANAEROBIC Blood Culture results may not be optimal due to an excessive volume of blood received in culture bottles   Culture   Final    NO GROWTH < 24 HOURS Performed at [facility] [address], [place], [place] [address]    Report Status PENDING  Incomplete         Radiology Studies: Dg Chest 2 View  Result Date: 02/08/2018 CLINICAL DATA:  Cough and shortness of breath for 1 week. EXAM: CHEST - 2 VIEW COMPARISON:  07/17/2014. FINDINGS: The heart is enlarged. There are BILATERAL pulmonary opacities which could represent pneumonia or pulmonary edema. No acute osseous findings. IMPRESSION: Cardiomegaly with BILATERAL pulmonary opacities which could represent pneumonia or pulmonary edema.Marked worsening aeration when compared with priors. Electronically Signed   By: Staci Righter M.D.   On: 02/08/2018 20:17        Scheduled Meds: . azithromycin  500 mg Oral Daily  . ipratropium-albuterol  3 mL Nebulization TID  . sodium chloride flush  3 mL Intravenous Q12H   Continuous Infusions: . sodium chloride 250 mL (02/10/18 0047)  . cefTRIAXone (ROCEPHIN)  IV Stopped (02/10/18 0118)     LOS: 0 days    Time spent: over 30 min    Fayrene Helper, MD [facility] Pager [phone]  If 7PM-7AM, please contact  night-coverage [email or web address] Password [facility] 02/10/2018, 9:56 AM

## 2018-02-11 ENCOUNTER — Encounter (HOSPITAL_COMMUNITY): Payer: Self-pay

## 2018-02-11 DIAGNOSIS — J189 Pneumonia, unspecified organism: Secondary | ICD-10-CM | POA: Diagnosis not present

## 2018-02-11 LAB — BASIC METABOLIC PANEL
Anion gap: 7 (ref 5–15)
BUN: 11 mg/dL (ref 6–20)
CO2: 29 mmol/L (ref 22–32)
CREATININE: 0.79 mg/dL (ref 0.44–1.00)
Calcium: 8.7 mg/dL — ABNORMAL LOW (ref 8.9–10.3)
Chloride: 107 mmol/L (ref 101–111)
GFR calc non Af Amer: >60 mL/min (ref >60)
Glucose, Bld: 116 mg/dL — ABNORMAL HIGH (ref 65–99)
Potassium: 4.1 mmol/L (ref 3.5–5.1)
Sodium: 143 mmol/L (ref 135–145)

## 2018-02-11 LAB — HEMOGLOBIN AND HEMATOCRIT, BLOOD
HCT: 28.2 % — ABNORMAL LOW (ref 36.0–46.0)
HCT: 29.7 % — ABNORMAL LOW (ref 36.0–46.0)
HEMOGLOBIN: 7.3 g/dL — AB (ref 12.0–15.0)
Hemoglobin: 7.8 g/dL — ABNORMAL LOW (ref 12.0–15.0)

## 2018-02-11 LAB — CBC
HCT: 28.5 % — ABNORMAL LOW (ref 36.0–46.0)
HEMOGLOBIN: 7.3 g/dL — AB (ref 12.0–15.0)
MCH: 18.8 pg — AB (ref 26.0–34.0)
MCHC: 25.6 g/dL — AB (ref 30.0–36.0)
MCV: 73.3 fL — ABNORMAL LOW (ref 78.0–100.0)
Platelets: 308 10*3/uL (ref 150–400)
RBC: 3.89 MIL/uL (ref 3.87–5.11)
RDW: 20.6 % — ABNORMAL HIGH (ref 11.5–15.5)
WBC: 8.7 10*3/uL (ref 4.0–10.5)

## 2018-02-11 LAB — BPAM RBC
BLOOD PRODUCT EXPIRATION DATE: 201906262359
ISSUE DATE / TIME: 201906191131
Unit Type and Rh: 7300

## 2018-02-11 LAB — LEGIONELLA PNEUMOPHILA SEROGP 1 UR AG: L. pneumophila Serogp 1 Ur Ag: NEGATIVE

## 2018-02-11 LAB — TYPE AND SCREEN
ABO/RH(D): B POS
ANTIBODY SCREEN: NEGATIVE
Unit division: 0

## 2018-02-11 LAB — MAGNESIUM: Magnesium: 2 mg/dL (ref 1.7–2.4)

## 2018-02-11 MED ORDER — AMOXICILLIN 500 MG PO CAPS: 1000.0000 mg | ORAL_CAPSULE | Freq: Three times a day (TID) | ORAL | 0 refills | Status: AC

## 2018-02-11 MED ORDER — FERROUS SULFATE 325 (65 FE) MG PO TABS: 325.0000 mg | ORAL_TABLET | Freq: Every day | ORAL | 0 refills | Status: DC

## 2018-02-11 MED ORDER — AZITHROMYCIN 500 MG PO TABS: 500.0000 mg | ORAL_TABLET | Freq: Once | ORAL | 0 refills | Status: AC

## 2018-02-11 MED ORDER — ALBUTEROL SULFATE HFA 108 (90 BASE) MCG/ACT IN AERS: 2.0000 | INHALATION_SPRAY | Freq: Four times a day (QID) | RESPIRATORY_TRACT | 0 refills | Status: DC | PRN

## 2018-02-11 NOTE — Discharge Summary (Signed)
Physician Discharge Summary  [name] BSJ:[id number] DOB: Jan 10, 1976 DOA: 02/08/2018  PCP: Patient, No Pcp Per  Admit date: 02/08/2018 Discharge date: 02/11/2018  Time spent: over 35 minutes  Recommendations for Outpatient Follow-up:  1. Follow up outpatient CBC/CMP 2. Follow up MMA, pending at time of discharge for low normal B12 3. Follow up with new PCP for heavy menstrual cycles and to follow up iron levels  4. Ensure pt establishes with PCP 5. Follow up repeat CXR as outpatient  6. Follow final sputum and blood cx 7. Discharged with albuterol as pt feels like this significantly helped her symptoms - follow, consider PFT's as outpatient if persistent sx improved with albuterol  Discharge Diagnoses:  Principal Problem:   Pneumonia Active Problems:   ANEMIA-IRON DEFICIENCY   Discharge Condition: stable  Diet recommendation: heart healthy  Filed Weights   02/08/18 2136 02/09/18 0600  Weight: (!) 152 kg (335 lb) (!) 190.5 kg (419 lb 15.6 oz)    History of present illness:  Ann Woods Ann Woods 42 y.o.femalewith medical history significant forobesity with BMI 54, now presenting to the emergency department for evaluation of shortness of breath, cough, and wheezing being treated for CAP.  She improved with antibiotics.  Her Hb was less than 7 on 6/19 and she received 1 unit of pRBC.  Her Hb had improved at the time of discharge and was stable.  She was discharged with antibiotics to complete Ann Woods course for pneumonia.    Hospital Course:  1.Pneumonia She improved after treatment with antibiotics and was discharged on 6/20 with abx to complete Ann Woods course for pneumonia. -Presents with SOB, cough, fever -Found to be febrile with leukocytosis, tachycardia, and CXR concerning for bilateral PNA (vs pulm edema) - repeat CXR as outpatient in Ann Woods few weeks after completes course.  -Urine strep negative.  Urine legionella negative. - Sputum cx pending collection - Blood cx  NGTD - continue ceftriaxone/azithromycin -> discharged with azithro/amoxicillin  2.Microcytic anemia -H/H downtrendede to 6.8 on 6/19.  Was transfused 1 unit of pRBC with bump to 7.8 at time of discharge (bump appears inappropriate - Hb was 7.2 on repeat before transfusion and ultimately only bumped to 7.8, but stable at time of discharge, follow outpatient). -She's not currently on her period.  -Suspect IDA secondary to menorrhagia -Low normal B12, follow MMA - Normal folate.  Iron deficiency.    Procedures:  none   Consultations:  none  Discharge Exam: Vitals:   02/11/18 1509 02/11/18 1656  BP:  (!) 111/51  Pulse:  92  Resp:  17  Temp:  98.6 F (37 C)  SpO2: 93% (!) 88%   Feeling better.  Ready to go home.  General: No acute distress.  obese Cardiovascular: Heart sounds show Ann Woods regular rate, and rhythm. No gallops or rubs. No murmurs. No JVD. Lungs: Clear to auscultation bilaterally with good air movement. No rales, rhonchi or wheezes. Abdomen: Soft, nontender, nondistended with normal active bowel sounds. No masses. No hepatosplenomegaly. Neurological: Alert and oriented 3. Moves all extremities 4. Cranial nerves II through XII grossly intact. Skin: Warm and dry. No rashes or lesions. Extremities: No clubbing or cyanosis. No edema. Pedal pulses 2+. Psychiatric: Mood and affect are normal. Insight and judgment are appropriate.  Discharge Instructions   Discharge Instructions    Call MD for:  difficulty breathing, headache or visual disturbances   Complete by:  As directed    Call MD for:  extreme fatigue   Complete by:  As directed    Call MD for:  persistant dizziness or light-headedness   Complete by:  As directed    Call MD for:  persistant nausea and vomiting   Complete by:  As directed    Call MD for:  redness, tenderness, or signs of infection (pain, swelling, redness, odor or green/yellow discharge around incision site)   Complete by:  As  directed    Call MD for:  severe uncontrolled pain   Complete by:  As directed    Call MD for:  temperature >100.4   Complete by:  As directed    Diet - low sodium heart healthy   Complete by:  As directed    Discharge instructions   Complete by:  As directed    You were seen for pneumonia.    You've improved with antibiotics.  We will send you home with 1 more dose of azithromycin to take tonight and 2 additional days of amoxicillin to take over the next 2 days starting tonight.  You also had low blood counts and received Ann Woods transfusion of blood.  Please follow up with your new PCP to discuss your heavy periods and options for this to help reduce bleeding and the iron deficiency anemia that you have.  We started you on iron, take this Ann.  You should get your blood counts rechecked within the week.  Your iron levels should be rechecked after you've been on iron at some point as well (usually sometime after about 1 month).  We also checked your vitamin b12 levels which were borderline.  We are following up Ann Woods more definitive test for low B12 levels.  Your new PCP should follow this up as well.  Return for new, recurrent, or worsening symptoms.  Please ask your PCP to request records from this hospitalization so they know what was done and what the next steps will be.   Increase activity slowly   Complete by:  As directed      Allergies as of 02/11/2018   No Known Allergies     Medication List    TAKE these medications   acetaminophen 325 MG tablet Commonly known as:  TYLENOL Take 650 mg by mouth every 6 (six) hours as needed.   albuterol 108 (90 Base) MCG/ACT inhaler Commonly known as:  PROVENTIL HFA;VENTOLIN HFA Inhale 2 puffs into the lungs every 6 (six) hours as needed for wheezing or shortness of breath.   amoxicillin 500 MG capsule Commonly known as:  AMOXIL Take 2 capsules (1,000 mg total) by mouth 3 (three) times Ann for 2 days. (starting tonight 6/20)    azithromycin 500 MG tablet Commonly known as:  ZITHROMAX Take 1 tablet (500 mg total) by mouth once for 1 dose. Take tonight 6/20   ferrous sulfate 325 (65 FE) MG tablet Take 1 tablet (325 mg total) by mouth Ann with breakfast.   loratadine 10 MG tablet Commonly known as:  CLARITIN Take 10 mg by mouth Ann.      No Known Allergies    The results of significant diagnostics from this hospitalization (including imaging, microbiology, ancillary and laboratory) are listed below for reference.    Significant Diagnostic Studies: Dg Chest 2 View  Result Date: 02/08/2018 CLINICAL DATA:  Cough and shortness of breath for 1 week. EXAM: CHEST - 2 VIEW COMPARISON:  07/17/2014. FINDINGS: The heart is enlarged. There are BILATERAL pulmonary opacities which could represent pneumonia or pulmonary edema. No acute osseous findings. IMPRESSION: Cardiomegaly with BILATERAL  pulmonary opacities which could represent pneumonia or pulmonary edema.Marked worsening aeration when compared with priors. Electronically Signed   By: Staci Righter M.D.   On: 02/08/2018 20:17    Microbiology: Recent Results (from the past 240 hour(s))  Culture, blood (Routine x 2)     Status: None (Preliminary result)   Collection Time: 02/08/18  9:50 PM  Result Value Ref Range Status   Specimen Description BLOOD RIGHT ARM  Final   Special Requests   Final    BOTTLES DRAWN AEROBIC AND ANAEROBIC Blood Culture results may not be optimal due to an excessive volume of blood received in culture bottles   Culture   Final    NO GROWTH 3 DAYS Performed at [facility], [place] [address], [place], [place] [address]    Report Status PENDING  Incomplete  Culture, blood (Routine x 2)     Status: None (Preliminary result)   Collection Time: 02/08/18  9:51 PM  Result Value Ref Range Status   Specimen Description BLOOD LEFT HAND  Final   Special Requests   Final    BOTTLES DRAWN AEROBIC AND ANAEROBIC Blood Culture results may not  be optimal due to an excessive volume of blood received in culture bottles   Culture   Final    NO GROWTH 3 DAYS Performed at [facility], [place] [address] [place], [place] [address]    Report Status PENDING  Incomplete  Culture, sputum-assessment     Status: None   Collection Time: 02/10/18  2:39 PM  Result Value Ref Range Status   Specimen Description SPUTUM  Final   Special Requests Normal  Final   Sputum evaluation   Final    THIS SPECIMEN IS ACCEPTABLE FOR SPUTUM CULTURE Performed at [facility], [address], [place], [place] [address]    Report Status 02/10/2018 FINAL  Final  Culture, respiratory (NON-Expectorated)     Status: None (Preliminary result)   Collection Time: 02/10/18  2:39 PM  Result Value Ref Range Status   Specimen Description SPUTUM  Final   Special Requests Normal Reflexed from [id number]  Final   Gram Stain   Final    ABUNDANT WBC PRESENT, PREDOMINANTLY PMN RARE GRAM POSITIVE COCCI    Culture   Final    CULTURE REINCUBATED FOR BETTER GROWTH Performed at [facility], [place] [address], [place], [place] [address]    Report Status PENDING  Incomplete     Labs: Basic Metabolic Panel: Recent Labs  Lab 02/08/18 2150 02/09/18 0110 02/10/18 0437 02/11/18 0314  NA 138 135 141 143  K 3.8 4.4 3.8 4.1  CL 102 102 108 107  CO2 26 25 28 29   GLUCOSE 121* 128* 116* 116*  BUN 6 7 8 11   CREATININE 0.93 0.83 0.76 0.79  CALCIUM 8.8* 8.3* 8.3* 8.7*  MG  --   --   --  2.0   Liver Function Tests: Recent Labs  Lab 02/08/18 2150  AST 23  ALT 19  ALKPHOS 91  BILITOT 1.1  PROT 7.4  ALBUMIN 2.9*   No results for input(s): LIPASE, AMYLASE in the last 168 hours. No results for input(s): AMMONIA in the last 168 hours. CBC: Recent Labs  Lab 02/08/18 2150 02/09/18 0110 02/10/18 0437 02/10/18 1040 02/11/18 0314 02/11/18 0811 02/11/18 1337  WBC 11.6* 11.6* 10.2  --  8.7  --   --   NEUTROABS 8.8* 10.2*  --   --   --   --   --  HGB 7.6*  7.3* 6.8* 7.2* 7.3* 7.3* 7.8*  HCT 29.6* 27.4* 26.7* 28.8* 28.5* 28.2* 29.7*  MCV 72.2* 70.6* 72.4*  --  73.3*  --   --   PLT 342 318 338  --  308  --   --    Cardiac Enzymes: No results for input(s): CKTOTAL, CKMB, CKMBINDEX, TROPONINI in the last 168 hours. BNP: BNP (last 3 results) No results for input(s): BNP in the last 8760 hours.  ProBNP (last 3 results) No results for input(s): PROBNP in the last 8760 hours.  CBG: No results for input(s): GLUCAP in the last 168 hours.     Signed:  Fayrene Helper MD.  [facility] 02/11/2018, 5:54 PM

## 2018-02-11 NOTE — Progress Notes (Signed)
D/C instructions given and reviewed. Pt verbalized an understanding and has no additional questions or concerns at this time. Pt ambulating independently, family at the bedside. Pt setting up new PCP appt after d/c for follow up.

## 2018-02-11 NOTE — Care Management Note (Addendum)
Case Management Note  Patient Details  Name: Ann Woods MRN: [id number] Date of Birth: 07/20/1976  Subjective/Objective:   From home with spouse, presents with pna, anemia, transfused one unit prbc, if hgb stable will dc home today. NCM gave patient [facility] information to assist her in finding pcp in network with her insurance.                   Action/Plan: DC home when stable.   Expected Discharge Date:                  Expected Discharge Plan:  Home/Self Care  In-House Referral:     Discharge planning Services  CM Consult  Post Acute Care Choice:    Choice offered to:     DME Arranged:    DME Agency:     HH Arranged:    HH Agency:     Status of Service:  In process, will continue to follow  If discussed at Long Length of Stay Meetings, dates discussed:    Additional Comments:  Zenon Mayo, RN 02/11/2018, 2:41 PM

## 2018-02-13 LAB — CULTURE, BLOOD (ROUTINE X 2)
Culture: NO GROWTH
Culture: NO GROWTH

## 2018-02-13 LAB — CULTURE, RESPIRATORY W GRAM STAIN
Culture: NORMAL
Special Requests: NORMAL

## 2018-02-13 LAB — METHYLMALONIC ACID, SERUM: METHYLMALONIC ACID, QUANTITATIVE: 171 nmol/L (ref 0–378)

## 2018-02-17 ENCOUNTER — Ambulatory Visit (INDEPENDENT_AMBULATORY_CARE_PROVIDER_SITE_OTHER): Payer: Managed Care, Other (non HMO) | Admitting: Family Medicine

## 2018-02-17 ENCOUNTER — Encounter: Payer: Self-pay | Admitting: Family Medicine

## 2018-02-17 VITALS — BP 136/70 | HR 94 | Temp 98.7°F | Resp 18 | Ht 65.0 in | Wt >= 6400 oz

## 2018-02-17 DIAGNOSIS — R942 Abnormal results of pulmonary function studies: Secondary | ICD-10-CM

## 2018-02-17 DIAGNOSIS — N920 Excessive and frequent menstruation with regular cycle: Secondary | ICD-10-CM

## 2018-02-17 DIAGNOSIS — D649 Anemia, unspecified: Secondary | ICD-10-CM

## 2018-02-17 DIAGNOSIS — Z13220 Encounter for screening for lipoid disorders: Secondary | ICD-10-CM | POA: Diagnosis not present

## 2018-02-17 DIAGNOSIS — J189 Pneumonia, unspecified organism: Secondary | ICD-10-CM | POA: Diagnosis not present

## 2018-02-17 DIAGNOSIS — I517 Cardiomegaly: Secondary | ICD-10-CM

## 2018-02-17 DIAGNOSIS — R0609 Other forms of dyspnea: Secondary | ICD-10-CM

## 2018-02-17 DIAGNOSIS — E538 Deficiency of other specified B group vitamins: Secondary | ICD-10-CM

## 2018-02-17 MED ORDER — MOMETASONE FURO-FORMOTEROL FUM 100-5 MCG/ACT IN AERO: 2.0000 | INHALATION_SPRAY | Freq: Two times a day (BID) | RESPIRATORY_TRACT | 0 refills | Status: DC

## 2018-02-17 NOTE — Progress Notes (Signed)
Subjective:    Patient ID: Ann Woods, female    DOB: 03/21/1976, 42 y.o.   MRN: [id number]  HPI Chief Complaint  Patient presents with  . New Patient (Initial Visit)  . Hospitalization Follow-up    pneumonia    She is new to the practice and here to establish care and for a hospital discharge follow up. History of severe obesity, anemia thought to be related to heavy menses and pregnancy induced HTN. Denies other significant past medical history.  Previous medical care: urgent cares. No PCP or OB/GYN in the past  Last regular medical care in 2015 when she was pregnant.   States she went to the urgent care for sinus infection 3-4 weeks ago and took a course of Amoxil and Tessalon. She felt better and then 2-3 weeks later she started having trouble with cough and shortness of breath.  Admitted to the hospital on 02/08/2018.  Diagnosed with multifocal pneumonia vs pulm edema and was hospitalized.  Discharged from hospital on 02/11/2018. States she feels much better overall but still has dry cough, mainly at night and shortness of breath with exertion. States she went back to work today and did pretty well.  Denies chest pain, orthopnea, PND.   Discharged with albuterol inhaler. She has been using this every day once or twice and reports improvement with shortness of breath. Denies history of asthma.   Chest XR showed cardiomegaly. States she has never seen a cardiologist.   Normal sputum cultures. Negative blood cultures.   Denies fever, chills, dizziness, fatigue, chest pain, palpitations, abdominal pain, back pain, N/V/D or constipation.  Denies blood in stool. No urinary symptoms.   Smoked for approximately 6 months only. Stopped 1995.  While hospitalized she was found to have low vitamin B12 with normal MMA. She plans to start taking OTC vitamin B12.   History of heavy menstrual cycles and anemia with a hemoglobin of 6.8 upon admission to the hospital. She received 1 unit  of PRBCs in the hospital. Upon discharge her Hgb improved to 7.8.  Reports history of anemia for many years. States this has never been worked up.  LMP: 01/17/2018 States she has one period per month. Periods are heavy in for the first 3 days and then lightens up some. Bleeds for 7 days.   Tubal ligation in the past.   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  3 kids. Has a partner.   Reviewed allergies, medications, past medical, surgical, family, and social history.     Review of Systems Pertinent positives and negatives in the history of present illness.     Objective:   Physical Exam  Constitutional: She is oriented to person, place, and time. She appears well-developed and well-nourished. No distress.  Severely obese female which makes assessment somewhat difficulty  HENT:  Right Ear: External ear normal.  Left Ear: External ear normal.  Mouth/Throat: Oropharynx is clear and moist.  Eyes: Pupils are equal, round, and reactive to light. Conjunctivae and EOM are normal.  Neck: Normal range of motion. Neck supple.  Cardiovascular: Normal rate, regular rhythm and intact distal pulses.  No LE edema   Pulmonary/Chest: Effort normal and breath sounds normal. No respiratory distress. She has no wheezes.  Lymphadenopathy:    She has no cervical adenopathy.  Neurological: She is alert and oriented to person, place, and time. She has normal strength. No cranial nerve deficit or sensory deficit.  Skin: Skin is warm and dry. Capillary refill  takes less than 2 seconds.   BP 136/70   Pulse 94   Temp 98.7 F (37.1 C) (Oral)   Resp 18   Ht 5\' 5"  (1.651 m)   Wt (!) 419 lb (190.1 kg)   SpO2 97%   Breastfeeding? No   BMI 69.73 kg/m      Assessment & Plan:  Community acquired pneumonia, unspecified laterality - Plan: CBC with Differential/Platelet, Comprehensive metabolic panel  Anemia, unspecified type - Plan: CBC with Differential/Platelet, Ambulatory referral to  Gynecology, CANCELED: Iron, TIBC and Ferritin Panel  Morbid obesity ([place]) - Plan: TSH, Lipid panel  Cardiomegaly - Plan: Brain natriuretic peptide, ECHOCARDIOGRAM COMPLETE, Spirometry with graph  Dyspnea on exertion - Plan: CBC with Differential/Platelet, Comprehensive metabolic panel, Brain natriuretic peptide, ECHOCARDIOGRAM COMPLETE, Spirometry with graph, mometasone-formoterol (DULERA) 100-5 MCG/ACT AERO, CANCELED: Iron, TIBC and Ferritin Panel  Low serum vitamin B12 - Plan: CANCELED: Vitamin B12  Screening for lipid disorders - Plan: Lipid panel  Abnormal PFT - Plan: mometasone-formoterol (DULERA) 100-5 MCG/ACT AERO  Menorrhagia with regular cycle  New to me today. She is a pleasant female who has not had any regular medical care in many years. Reviewed hospital discharge summary along with medications, lab results and all radiology results.  She appears to be improved but still somewhat symptomatic with DOE. Appears to be euvolemic.  She was hospitalized, treated for pneumonia, completed her antibiotics and reports feeling better overall. Continue having DOE and a mild dry cough.  Found to be anemic and transfused. Hgb upon discharge 7.8. She is taking oral iron.  Etiology for anemia suspected to be related to heavy menses. This has never been worked up.  Referral to OB/GYN and she was scheduled to see them in 2 weeks.  CXR shows cardiomegaly. Based on this finding and DOE I am ordering an echocardiogram. No chest pain or palpitations.  She will start taking OTC vitamin B12, this was slightly lower than desired with a normal MMA.  She is fasting so we will check lipid panel.  PFTs abnormal today. Relief with albuterol inhaler. Will have her try a 2 week trial of Dulera to see if she notices improvement. A sample was given.  She is aware that her severe obesity is contributing to her health conditions. We spent several minutes discussing options for weight loss including referral to  weight loss counseling or bariatric surgery in the future.  Discussed that we will need to repeat her CXR and PFTs in the near future.  Will check BNP, CBC, CMP, TSH and lipids.  Echo scheduled for Friday.  Follow up pending test results or in 2 weeks.  Spent 60 minutes face to face with patient and more than 50% was in counseling and coordination of care.

## 2018-02-18 LAB — CBC WITH DIFFERENTIAL/PLATELET
Basophils Absolute: 0 10*3/uL (ref 0.0–0.2)
Basos: 0 %
EOS (ABSOLUTE): 0.1 10*3/uL (ref 0.0–0.4)
Eos: 2 %
HEMATOCRIT: 31.8 % — AB (ref 34.0–46.6)
HEMOGLOBIN: 8.9 g/dL — AB (ref 11.1–15.9)
Immature Grans (Abs): 0 10*3/uL (ref 0.0–0.1)
Immature Granulocytes: 0 %
LYMPHS ABS: 1.8 10*3/uL (ref 0.7–3.1)
LYMPHS: 29 %
MCH: 19.6 pg — AB (ref 26.6–33.0)
MCHC: 28 g/dL — ABNORMAL LOW (ref 31.5–35.7)
MCV: 70 fL — AB (ref 79–97)
MONOS ABS: 0.4 10*3/uL (ref 0.1–0.9)
Monocytes: 7 %
NEUTROS ABS: 3.9 10*3/uL (ref 1.4–7.0)
Neutrophils: 62 %
Platelets: 354 10*3/uL (ref 150–450)
RBC: 4.55 x10E6/uL (ref 3.77–5.28)
RDW: 22.5 % — ABNORMAL HIGH (ref 12.3–15.4)
WBC: 6.3 10*3/uL (ref 3.4–10.8)

## 2018-02-18 LAB — VITAMIN B12: VITAMIN B 12: 478 pg/mL (ref 232–1245)

## 2018-02-18 LAB — COMPREHENSIVE METABOLIC PANEL
ALBUMIN: 3.7 g/dL (ref 3.5–5.5)
ALT: 19 IU/L (ref 0–32)
AST: 16 IU/L (ref 0–40)
Albumin/Globulin Ratio: 1.2 (ref 1.2–2.2)
Alkaline Phosphatase: 104 IU/L (ref 39–117)
BUN/Creatinine Ratio: 10 (ref 9–23)
BUN: 9 mg/dL (ref 6–24)
Bilirubin Total: 0.5 mg/dL (ref 0.0–1.2)
CO2: 25 mmol/L (ref 20–29)
Calcium: 9 mg/dL (ref 8.7–10.2)
Chloride: 102 mmol/L (ref 96–106)
Creatinine, Ser: 0.87 mg/dL (ref 0.57–1.00)
GFR calc non Af Amer: 83 mL/min/{1.73_m2} (ref >59)
GFR, EST AFRICAN AMERICAN: 96 mL/min/{1.73_m2} (ref >59)
GLOBULIN, TOTAL: 3.1 g/dL (ref 1.5–4.5)
Glucose: 86 mg/dL (ref 65–99)
POTASSIUM: 4.4 mmol/L (ref 3.5–5.2)
Sodium: 140 mmol/L (ref 134–144)
TOTAL PROTEIN: 6.8 g/dL (ref 6.0–8.5)

## 2018-02-18 LAB — TSH: TSH: 1.95 u[IU]/mL (ref 0.450–4.500)

## 2018-02-18 LAB — LIPID PANEL
CHOLESTEROL TOTAL: 147 mg/dL (ref 100–199)
Chol/HDL Ratio: 5.3 ratio — ABNORMAL HIGH (ref 0.0–4.4)
HDL: 28 mg/dL — ABNORMAL LOW (ref >39)
LDL CALC: 90 mg/dL (ref 0–99)
Triglycerides: 145 mg/dL (ref 0–149)
VLDL CHOLESTEROL CAL: 29 mg/dL (ref 5–40)

## 2018-02-18 LAB — BRAIN NATRIURETIC PEPTIDE: BNP: 15.6 pg/mL (ref 0.0–100.0)

## 2018-02-19 ENCOUNTER — Ambulatory Visit (HOSPITAL_COMMUNITY): Payer: Managed Care, Other (non HMO) | Attending: Cardiovascular Disease

## 2018-02-19 ENCOUNTER — Other Ambulatory Visit: Payer: Self-pay

## 2018-02-19 DIAGNOSIS — I517 Cardiomegaly: Secondary | ICD-10-CM | POA: Diagnosis present

## 2018-02-19 DIAGNOSIS — R0609 Other forms of dyspnea: Secondary | ICD-10-CM

## 2018-02-19 DIAGNOSIS — I10 Essential (primary) hypertension: Secondary | ICD-10-CM | POA: Insufficient documentation

## 2018-02-19 DIAGNOSIS — D649 Anemia, unspecified: Secondary | ICD-10-CM | POA: Insufficient documentation

## 2018-02-19 DIAGNOSIS — Z87891 Personal history of nicotine dependence: Secondary | ICD-10-CM | POA: Diagnosis not present

## 2018-02-23 ENCOUNTER — Telehealth: Payer: Self-pay

## 2018-02-23 NOTE — Telephone Encounter (Signed)
Patient called and left message on my voicemail wanting me to call her back.  Patient notified of Gyn appointment on 03-02-18 at [facility].

## 2018-03-02 ENCOUNTER — Encounter: Payer: Self-pay | Admitting: Obstetrics and Gynecology

## 2018-03-02 ENCOUNTER — Ambulatory Visit (INDEPENDENT_AMBULATORY_CARE_PROVIDER_SITE_OTHER): Payer: Managed Care, Other (non HMO) | Admitting: Obstetrics and Gynecology

## 2018-03-02 VITALS — BP 133/82 | HR 99 | Ht 66.0 in | Wt >= 6400 oz

## 2018-03-02 DIAGNOSIS — Z01411 Encounter for gynecological examination (general) (routine) with abnormal findings: Secondary | ICD-10-CM

## 2018-03-02 DIAGNOSIS — Z1151 Encounter for screening for human papillomavirus (HPV): Secondary | ICD-10-CM | POA: Diagnosis not present

## 2018-03-02 DIAGNOSIS — N938 Other specified abnormal uterine and vaginal bleeding: Secondary | ICD-10-CM

## 2018-03-02 DIAGNOSIS — Z01419 Encounter for gynecological examination (general) (routine) without abnormal findings: Secondary | ICD-10-CM

## 2018-03-02 DIAGNOSIS — Z124 Encounter for screening for malignant neoplasm of cervix: Secondary | ICD-10-CM | POA: Diagnosis not present

## 2018-03-02 MED ORDER — MEGESTROL ACETATE 40 MG PO TABS: 40.0000 mg | ORAL_TABLET | Freq: Two times a day (BID) | ORAL | 5 refills | Status: DC

## 2018-03-02 NOTE — Progress Notes (Signed)
Subjective:     Ann Woods is a 42 y.o. female P3 with LMP 02/17/2018 and BMI 65.98 who is here for a comprehensive physical exam. The patient reports a monthly period lasting 7 days with the first 3-4 days associated with heavy flow and passage of clots. Patient was recently hospitalized for symptomatic anemia. Patient is sexually active using BTL for contraception. She denies any pelvic pain or abnormal discharge.   Past Medical History:  Diagnosis Date  . Hypertension    G2 - preeclampsia, CHTN  . Obesity   . Seasonal allergies    Past Surgical History:  Procedure Laterality Date  . CESAREAN SECTION    . HIP SURGERY    . TUBAL LIGATION Bilateral 05/05/2014   Procedure: POST PARTUM TUBAL LIGATION;  Surgeon: Donnamae Jude, MD;  Location: [facility];  Service: Gynecology;  Laterality: Bilateral;   Family History  Problem Relation Age of Onset  . Heart disease Mother     Social History   Socioeconomic History  . Marital status: Significant Other    Spouse name: Not on file  . Number of children: Not on file  . Years of education: Not on file  . Highest education level: Not on file  Occupational History  . Not on file  Social Needs  . Financial resource strain: Not on file  . Food insecurity:    Worry: Not on file    Inability: Not on file  . Transportation needs:    Medical: Not on file    Non-medical: Not on file  Tobacco Use  . Smoking status: Former Smoker    Last attempt to quit: 08/25/1993    Years since quitting: 24.5  . Smokeless tobacco: Never Used  Substance and Sexual Activity  . Alcohol use: Yes  . Drug use: No  . Sexual activity: Yes    Birth control/protection: None  Lifestyle  . Physical activity:    Days per week: Not on file    Minutes per session: Not on file  . Stress: Not on file  Relationships  . Social connections:    Talks on phone: Not on file    Gets together: Not on file    Attends religious service: Not on file    Active member of  club or organization: Not on file    Attends meetings of clubs or organizations: Not on file    Relationship status: Not on file  . Intimate partner violence:    Fear of current or ex partner: Not on file    Emotionally abused: Not on file    Physically abused: Not on file    Forced sexual activity: Not on file  Other Topics Concern  . Not on file  Social History Narrative  . Not on file   Health Maintenance  Topic Date Due  . PAP SMEAR  11/21/2016  . INFLUENZA VACCINE  03/25/2018  . TETANUS/TDAP  02/17/2024  . HIV Screening  Completed       Review of Systems Pertinent items are noted in HPI.   Objective:  Blood pressure 133/82, pulse 99, height 5\' 6"  (1.676 m), weight (!) 408 lb 12.8 oz (185.4 kg), last menstrual period 02/17/2018.     GENERAL: Well-developed, well-nourished female in no acute distress.  HEENT: Normocephalic, atraumatic. Sclerae anicteric.  NECK: Supple. Normal thyroid.  LUNGS: Clear to auscultation bilaterally.  HEART: Regular rate and rhythm. BREASTS: Symmetric in size. No palpable masses or lymphadenopathy, skin changes, or nipple drainage.  ABDOMEN: Soft, nontender, nondistended. Obese PELVIC: Normal external female genitalia. Vagina is pink and rugated.  Normal discharge. Normal appearing cervix. Bimanual exam limited secondary to body habitus EXTREMITIES: No cyanosis, clubbing, or edema, 2+ distal pulses.    Assessment:    Healthy female exam.      Plan:    Pap smear collected Screening mammogram ordered Pelvic ultrasound ordered Discussed the benefits of an endometrial biopsy - endometrial biospy was attempted but patient could not tolerate the procedure. - She declined D&C hysteroscopy and is willing to try pre-procedure cytotec placement Medical management of DUB with megace for now Patient will be contacted with results of ultrasound RTC for endometrial biopsy See After Visit Summary for Counseling Recommendations

## 2018-03-03 ENCOUNTER — Ambulatory Visit (INDEPENDENT_AMBULATORY_CARE_PROVIDER_SITE_OTHER): Payer: Managed Care, Other (non HMO) | Admitting: Family Medicine

## 2018-03-03 ENCOUNTER — Encounter: Payer: Self-pay | Admitting: Family Medicine

## 2018-03-03 ENCOUNTER — Ambulatory Visit
Admission: RE | Admit: 2018-03-03 | Discharge: 2018-03-03 | Disposition: A | Discharge status: Home / Self Care | Payer: Managed Care, Other (non HMO) | Source: Ambulatory Visit | Attending: Family Medicine | Admitting: Family Medicine

## 2018-03-03 VITALS — BP 126/78 | HR 90 | Temp 98.6°F | Resp 16 | Wt >= 6400 oz

## 2018-03-03 DIAGNOSIS — R0609 Other forms of dyspnea: Secondary | ICD-10-CM | POA: Diagnosis not present

## 2018-03-03 DIAGNOSIS — J189 Pneumonia, unspecified organism: Secondary | ICD-10-CM

## 2018-03-03 DIAGNOSIS — D649 Anemia, unspecified: Secondary | ICD-10-CM | POA: Diagnosis not present

## 2018-03-03 LAB — COMPREHENSIVE METABOLIC PANEL
ALBUMIN: 3.8 g/dL (ref 3.5–5.5)
ALK PHOS: 97 IU/L (ref 39–117)
ALT: 18 IU/L (ref 0–32)
AST: 17 IU/L (ref 0–40)
Albumin/Globulin Ratio: 1.1 — ABNORMAL LOW (ref 1.2–2.2)
BILIRUBIN TOTAL: 0.5 mg/dL (ref 0.0–1.2)
BUN / CREAT RATIO: 14 (ref 9–23)
BUN: 13 mg/dL (ref 6–24)
CHLORIDE: 101 mmol/L (ref 96–106)
CO2: 24 mmol/L (ref 20–29)
CREATININE: 0.92 mg/dL (ref 0.57–1.00)
Calcium: 9.5 mg/dL (ref 8.7–10.2)
GFR calc Af Amer: 89 mL/min/{1.73_m2} (ref >59)
GFR calc non Af Amer: 78 mL/min/{1.73_m2} (ref >59)
GLUCOSE: 107 mg/dL — AB (ref 65–99)
Globulin, Total: 3.5 g/dL (ref 1.5–4.5)
Potassium: 4.5 mmol/L (ref 3.5–5.2)
Sodium: 141 mmol/L (ref 134–144)
TOTAL PROTEIN: 7.3 g/dL (ref 6.0–8.5)

## 2018-03-03 LAB — CBC
HEMOGLOBIN: 10 g/dL — AB (ref 11.1–15.9)
Hematocrit: 35.4 % (ref 34.0–46.6)
MCH: 20.9 pg — ABNORMAL LOW (ref 26.6–33.0)
MCHC: 28.2 g/dL — AB (ref 31.5–35.7)
MCV: 74 fL — ABNORMAL LOW (ref 79–97)
PLATELETS: 355 10*3/uL (ref 150–450)
RBC: 4.79 x10E6/uL (ref 3.77–5.28)
RDW: 25.3 % — ABNORMAL HIGH (ref 12.3–15.4)
WBC: 4.5 10*3/uL (ref 3.4–10.8)

## 2018-03-03 LAB — LIPID PANEL
CHOL/HDL RATIO: 3.3 ratio (ref 0.0–4.4)
CHOLESTEROL TOTAL: 160 mg/dL (ref 100–199)
HDL: 48 mg/dL (ref >39)
LDL Calculated: 97 mg/dL (ref 0–99)
Triglycerides: 75 mg/dL (ref 0–149)
VLDL Cholesterol Cal: 15 mg/dL (ref 5–40)

## 2018-03-03 LAB — HEMOGLOBIN A1C
Est. average glucose Bld gHb Est-mCnc: 117 mg/dL
HEMOGLOBIN A1C: 5.7 % — AB (ref 4.8–5.6)

## 2018-03-03 LAB — TSH: TSH: 3.14 u[IU]/mL (ref 0.450–4.500)

## 2018-03-03 NOTE — Progress Notes (Signed)
   Subjective:    Patient ID: Ann Woods, female    DOB: [birth year], 42 y.o.   MRN: [id number]  HPI Chief Complaint  Patient presents with  . 2 week follow-up    follow-up- echo, blood work, SOB has gotten better, still using inhalers   She is here to follow up on multiple issues.   CAP and DOE - States her breathing is better. She is able walk longer distances. Using her albuterol once daily still for coughing. Reports a dry cough that is worse at night. States she thinks this may be related to sleeping under a fan.  Abnormal PFTs 2 weeks ago.  She has not been using Dulera for the past 2 weeks as I recommended. She has the inhaler still.   Denies fever, chills, chest pain, palpitations, DOE, PND, abdominal pain, N/V/D.   Yesterday she saw her OB/GYN for DUB and prescribed Megace. She has not started this medication yet.  States her last period was not that heavy.  Anemia - taking oral iron and Hgb rechecked yesterday. This is improving.    Reviewed allergies, medications, past medical, surgical, family, and social history.   Review of Systems Pertinent positives and negatives in the history of present illness.     Objective:   Physical Exam BP 126/78   Pulse 90   Temp 98.6 F (37 C) (Oral)   Resp 16   Wt (!) 420 lb 3.2 oz (190.6 kg)   LMP 02/17/2018 (Exact Date)   SpO2 95%   BMI 67.82 kg/m   Alert and in no distress. Pharyngeal area is normal. Neck is supple without adenopathy or thyromegaly. Cardiac exam shows a regular sinus rhythm without murmurs or gallops. Lungs are clear to auscultation. Bilateral LE edema with trace non pitting edema. Pulses intact. Skin is warm and dry, no pallor.       Assessment & Plan:  Pneumonia of both lungs due to infectious organism, unspecified part of lung - Plan: DG Chest 2 View  Morbid obesity ([place])  Dyspnea on exertion - Plan: DG Chest 2 View  Anemia, unspecified type  Reviewed labs and echo with patient. Normal BNP 2  weeks ago.  She is doing well. Breathing has improved. Walking longer distances. She is still needing albuterol inhaler once daily for cough. She was given a Dulera inhaler 2 weeks ago but did not try this. Advised her to use the [facility] over the next 2 weeks. Instructed to rinse her mouth after use. She will return for a 4 week follow up.  Send for repeat chest XR due to CAP in hospital last month.  Hemoglobin has improved on oral iron. She will continue on this. Under the care of Dr. Elly Modena for DUB. She will start Megace.  If she needs a referral to podiatry she will call and we will do this for her due to chronic foot pain.

## 2018-03-03 NOTE — Patient Instructions (Signed)
Try using the steroid inhaler daily to see if this improves your cough and breathing so that you do not need the albuterol inhaler daily.  Rinse your mouth out after using this.   Follow up in 4 weeks.

## 2018-03-04 LAB — CYTOLOGY - PAP
DIAGNOSIS: UNDETERMINED — AB
HPV (WINDOPATH): NOT DETECTED

## 2018-03-05 ENCOUNTER — Encounter: Payer: Self-pay | Admitting: *Deleted

## 2018-03-10 ENCOUNTER — Ambulatory Visit
Admission: RE | Admit: 2018-03-10 | Discharge: 2018-03-10 | Disposition: A | Discharge status: Home / Self Care | Payer: Managed Care, Other (non HMO) | Source: Ambulatory Visit | Attending: Obstetrics and Gynecology | Admitting: Obstetrics and Gynecology

## 2018-03-10 DIAGNOSIS — Z01419 Encounter for gynecological examination (general) (routine) without abnormal findings: Secondary | ICD-10-CM

## 2018-03-30 ENCOUNTER — Ambulatory Visit
Admission: RE | Admit: 2018-03-30 | Discharge: 2018-03-30 | Disposition: A | Discharge status: Home / Self Care | Payer: Managed Care, Other (non HMO) | Source: Ambulatory Visit | Attending: Obstetrics and Gynecology | Admitting: Obstetrics and Gynecology

## 2018-03-30 ENCOUNTER — Other Ambulatory Visit: Payer: Self-pay | Admitting: Obstetrics and Gynecology

## 2018-03-30 DIAGNOSIS — Z1239 Encounter for other screening for malignant neoplasm of breast: Secondary | ICD-10-CM

## 2018-03-30 DIAGNOSIS — Z01419 Encounter for gynecological examination (general) (routine) without abnormal findings: Secondary | ICD-10-CM

## 2018-03-31 ENCOUNTER — Other Ambulatory Visit: Payer: Self-pay | Admitting: Obstetrics and Gynecology

## 2018-03-31 DIAGNOSIS — R928 Other abnormal and inconclusive findings on diagnostic imaging of breast: Secondary | ICD-10-CM

## 2018-04-01 ENCOUNTER — Ambulatory Visit (INDEPENDENT_AMBULATORY_CARE_PROVIDER_SITE_OTHER): Payer: Managed Care, Other (non HMO) | Admitting: Family Medicine

## 2018-04-01 ENCOUNTER — Encounter: Payer: Self-pay | Admitting: Family Medicine

## 2018-04-01 DIAGNOSIS — R0609 Other forms of dyspnea: Secondary | ICD-10-CM

## 2018-04-01 DIAGNOSIS — R942 Abnormal results of pulmonary function studies: Secondary | ICD-10-CM

## 2018-04-01 DIAGNOSIS — D509 Iron deficiency anemia, unspecified: Secondary | ICD-10-CM | POA: Diagnosis not present

## 2018-04-01 DIAGNOSIS — R7303 Prediabetes: Secondary | ICD-10-CM | POA: Diagnosis not present

## 2018-04-01 HISTORY — DX: Prediabetes: R73.03

## 2018-04-01 MED ORDER — MOMETASONE FURO-FORMOTEROL FUM 100-5 MCG/ACT IN AERO: 2.0000 | INHALATION_SPRAY | Freq: Two times a day (BID) | RESPIRATORY_TRACT | 1 refills | Status: DC

## 2018-04-01 NOTE — Progress Notes (Signed)
Subjective:    Patient ID: Ann Woods, female    DOB: 1976/04/27, 42 y.o.   MRN: [id number]  HPI Chief Complaint  Patient presents with  . other    follow up on pneumonia   She is here to follow up on pneumonia and shortness of breath. States symptoms resolved. She feels at her baseline.  Using [facility] and not needing albuterol.  No history of asthma and she did start using albuterol when diagnosed with pneumonia.  She was continuing to need albuterol on a regular basis so we tried steroid inhaler which she seems to be benefiting from.  She did have abnormal PFTs.  Questionable asthma diagnosis but she does not want to be referred to a pulmonologist.  Taking oral iron for anemia. Under the care of her OB/GYN for DUB and anemia. She was taking Megace to help with DUB but states she stopped this last month. States her appetite was increased and she was eating more. She is aware that she has a weight gain of 16lbs. She is concerned about her weight and would like help with this.  States she is interested in a research study if she qualifies.   Denies vaginal bleeding since June. She will follow up as recommended with her OB/GYN.   Denies fever, chills, dizziness, chest pain, palpitations, shortness of breath, orthopnea, abdominal pain, N/V/D, urinary symptoms, LE edema.   Reviewed allergies, medications, past medical, surgical, family, and social history.    Review of Systems Pertinent positives and negatives in the history of present illness.     Objective:   Physical Exam BP 132/86 (BP Location: Left Arm, Patient Position: Sitting)   Pulse 79   Temp 97.8 F (36.6 C)   Wt (!) 436 lb 3.2 oz (197.9 kg)   SpO2 95%   BMI 70.40 kg/m   Alert and oriented and in no acute distress. Heart exam shows RRR,. Lungs are CTA bilaterally and breathing is non labored. No LE edema. Skin is warm and dry.       Assessment & Plan:  Morbid obesity (HCC)  Iron deficiency anemia,  unspecified iron deficiency anemia type  Prediabetes  Reviewed lab and x-ray results with patient.  See if he had resolved on previous x-ray.  She appears to be back to baseline.   No prior history of asthma.  Albuterol use began with pneumonia diagnosis.  She was continuing to need albuterol as of breath so we tried a 2-week course of Dulera which she appears to be benefiting from.  Abnormal PFTs and I did recommend to pulmonology for further evaluation of asthma.  She declines referral at this time and would just like to continue on the steroid inhaler.  We will see how she does with this and if she has a flareup she is aware that she will need to be referred to pulmonology. She is aware that she has prediabetes.  She is also aware that she has had significant weight gain and she reports this is related to the Megace she was taking for DUB increasing her appetite.  Discussed multiple potential health consequences of morbid obesity.  Discussed the possibility of referring her to [facility] health weight loss counseling, bariatric surgery or being involved in a research study for weight loss.  She would like to try the research study first and then will let me know if she decides not to pursue this.  I will then refer her to [facility] health weight loss and counseling.  Refer to [facility] for weight loss.  Follow up in 4 weeks for weight and ?asthma. Repeat PFTs. Refer to pulmonology if needing albuterol.

## 2018-04-01 NOTE — Patient Instructions (Signed)
Continue on [facility] for now.   Call and let your OB/GYN know that you have stopped Megace due to weight gain.   Cut back calories and start walking or biking.   [facility] will call you. If you decide you are not interested please let me know and I will refer you to [facility].   Return in 4 weeks.

## 2018-04-07 ENCOUNTER — Ambulatory Visit
Admission: RE | Admit: 2018-04-07 | Discharge: 2018-04-07 | Disposition: A | Discharge status: Home / Self Care | Payer: Managed Care, Other (non HMO) | Source: Ambulatory Visit | Attending: Obstetrics and Gynecology | Admitting: Obstetrics and Gynecology

## 2018-04-07 ENCOUNTER — Other Ambulatory Visit: Payer: Self-pay | Admitting: Obstetrics and Gynecology

## 2018-04-07 DIAGNOSIS — R928 Other abnormal and inconclusive findings on diagnostic imaging of breast: Secondary | ICD-10-CM

## 2018-04-07 DIAGNOSIS — N6310 Unspecified lump in the right breast, unspecified quadrant: Secondary | ICD-10-CM

## 2018-05-05 ENCOUNTER — Encounter: Payer: Self-pay | Admitting: Family Medicine

## 2018-05-05 ENCOUNTER — Ambulatory Visit (INDEPENDENT_AMBULATORY_CARE_PROVIDER_SITE_OTHER): Payer: Managed Care, Other (non HMO) | Admitting: Family Medicine

## 2018-05-05 DIAGNOSIS — R062 Wheezing: Secondary | ICD-10-CM

## 2018-05-05 DIAGNOSIS — R0609 Other forms of dyspnea: Secondary | ICD-10-CM | POA: Diagnosis not present

## 2018-05-05 DIAGNOSIS — R942 Abnormal results of pulmonary function studies: Secondary | ICD-10-CM

## 2018-05-05 DIAGNOSIS — D509 Iron deficiency anemia, unspecified: Secondary | ICD-10-CM

## 2018-05-05 LAB — CBC WITH DIFFERENTIAL/PLATELET
BASOS ABS: 0 10*3/uL (ref 0.0–0.2)
Basos: 1 %
EOS (ABSOLUTE): 0.1 10*3/uL (ref 0.0–0.4)
Eos: 3 %
Hematocrit: 38.3 % (ref 34.0–46.6)
Hemoglobin: 11.3 g/dL (ref 11.1–15.9)
Immature Grans (Abs): 0 10*3/uL (ref 0.0–0.1)
Immature Granulocytes: 0 %
LYMPHS ABS: 1.4 10*3/uL (ref 0.7–3.1)
Lymphs: 30 %
MCH: 22.5 pg — AB (ref 26.6–33.0)
MCHC: 29.5 g/dL — AB (ref 31.5–35.7)
MCV: 76 fL — ABNORMAL LOW (ref 79–97)
MONOS ABS: 0.5 10*3/uL (ref 0.1–0.9)
Monocytes: 10 %
NEUTROS ABS: 2.6 10*3/uL (ref 1.4–7.0)
Neutrophils: 56 %
PLATELETS: 312 10*3/uL (ref 150–450)
RBC: 5.03 x10E6/uL (ref 3.77–5.28)
RDW: 18.1 % — ABNORMAL HIGH (ref 12.3–15.4)
WBC: 4.7 10*3/uL (ref 3.4–10.8)

## 2018-05-05 LAB — COMPREHENSIVE METABOLIC PANEL
ALK PHOS: 93 IU/L (ref 39–117)
ALT: 14 IU/L (ref 0–32)
AST: 17 IU/L (ref 0–40)
Albumin/Globulin Ratio: 1.2 (ref 1.2–2.2)
Albumin: 3.8 g/dL (ref 3.5–5.5)
BILIRUBIN TOTAL: 0.6 mg/dL (ref 0.0–1.2)
BUN / CREAT RATIO: 12 (ref 9–23)
BUN: 11 mg/dL (ref 6–24)
CHLORIDE: 101 mmol/L (ref 96–106)
CO2: 25 mmol/L (ref 20–29)
Calcium: 9.4 mg/dL (ref 8.7–10.2)
Creatinine, Ser: 0.93 mg/dL (ref 0.57–1.00)
GFR calc Af Amer: 88 mL/min/{1.73_m2} (ref >59)
GFR calc non Af Amer: 76 mL/min/{1.73_m2} (ref >59)
Globulin, Total: 3.3 g/dL (ref 1.5–4.5)
Glucose: 104 mg/dL — ABNORMAL HIGH (ref 65–99)
POTASSIUM: 4.7 mmol/L (ref 3.5–5.2)
Sodium: 141 mmol/L (ref 134–144)
Total Protein: 7.1 g/dL (ref 6.0–8.5)

## 2018-05-05 NOTE — Patient Instructions (Signed)
I am referring you to the [facility]. You will receive a call from them.   I am also referring you to [facility] for further evaluation of wheezing and shortness of breath.   We will call you with your lab results.

## 2018-05-05 NOTE — Progress Notes (Signed)
   Subjective:    Patient ID: Ann Woods, female    DOB: [birth year], 42 y.o.   MRN: [id number]  HPI Chief Complaint  Patient presents with  . 4 week follow-up    4 week follow-up on weight and asthma, does not want flu shot today. will get it later in the season   She is here for a 4 week follow up on weight and questionable asthma.  Has not gained or lost any weight.   States she is eating less than 800 calories per day.   We are also following up on questionable asthma vs morbid obesity as a reason for her wheezing and shortness of breath. This started after pneumonia diagnosis but recent XR shows that pneumonia cleared.  She reports improvement with Dulera and albuterol.  PFTs abnormal at previous visit.   IDA- She is still taking oral iron. Has not followed up with OB/GYN  Has not had any vaginal bleeding since 04/19/2018. Bleeding lasted 7 days and was heavy for the first 2 days.   Denies fever, chills, dizziness, chest pain, palpitations, abdominal pain, N/V/D, urinary symptoms, LE edema.   Reviewed allergies, medications, past medical, surgical, family, and social history.    Review of Systems Pertinent positives and negatives in the history of present illness.     Objective:   Physical Exam BP 124/80   Pulse 80   Resp 16   Ht 5\' 6"  (1.676 m)   Wt (!) 436 lb 6.4 oz (197.9 kg)   SpO2 99%   BMI 70.44 kg/m   Alert and in no distress.  Pharyngeal area is normal. Neck is supple without adenopathy or thyromegaly. Cardiac exam shows a regular sinus rhythm without murmurs or gallops. Lungs are clear to auscultation. Extremities without edema. Skin is warm and dry.       Assessment & Plan:  Morbid obesity ([place]) - Plan: Comprehensive metabolic panel, Amb Ref to Medical Weight Management  DOE (dyspnea on exertion)  Iron deficiency anemia, unspecified iron deficiency anemia type - Plan: CBC with Differential/Platelet, Comprehensive metabolic panel  Abnormal PFT -  Plan: Ambulatory referral to Pulmonology  Wheezing - Plan: Ambulatory referral to Pulmonology  Counseling on healthy diet with adequate caloric intake.  She does not appear to have much knowledge regarding healthy foods.  Plan to refer to [facility]. She is agreeable.  Referral to pulmonology for further evaluation for shortness of breath, wheezing and abnormal PFTs.  IDA- check labs and continue iron. Follow up with OB/GYN as recommended.

## 2018-05-25 ENCOUNTER — Encounter: Payer: Self-pay | Admitting: Internal Medicine

## 2018-05-27 ENCOUNTER — Encounter: Payer: Self-pay | Admitting: Pulmonary Disease

## 2018-05-27 ENCOUNTER — Other Ambulatory Visit (INDEPENDENT_AMBULATORY_CARE_PROVIDER_SITE_OTHER): Payer: Managed Care, Other (non HMO)

## 2018-05-27 ENCOUNTER — Ambulatory Visit (INDEPENDENT_AMBULATORY_CARE_PROVIDER_SITE_OTHER): Payer: Managed Care, Other (non HMO) | Admitting: Pulmonary Disease

## 2018-05-27 VITALS — BP 130/76 | HR 87 | Ht 64.75 in | Wt >= 6400 oz

## 2018-05-27 DIAGNOSIS — R0609 Other forms of dyspnea: Secondary | ICD-10-CM

## 2018-05-27 LAB — CBC WITH DIFFERENTIAL/PLATELET
Basophils Absolute: 0 10*3/uL (ref 0.0–0.1)
Basophils Relative: 0.5 % (ref 0.0–3.0)
EOS PCT: 4 % (ref 0.0–5.0)
Eosinophils Absolute: 0.1 10*3/uL (ref 0.0–0.7)
HCT: 39.1 % (ref 36.0–46.0)
Hemoglobin: 12.1 g/dL (ref 12.0–15.0)
LYMPHS ABS: 1.8 10*3/uL (ref 0.7–4.0)
Lymphocytes Relative: 48.6 % — ABNORMAL HIGH (ref 12.0–46.0)
MCHC: 31 g/dL (ref 30.0–36.0)
MCV: 76 fl — ABNORMAL LOW (ref 78.0–100.0)
MONO ABS: 0.5 10*3/uL (ref 0.1–1.0)
Monocytes Relative: 13.1 % — ABNORMAL HIGH (ref 3.0–12.0)
NEUTROS ABS: 1.2 10*3/uL — AB (ref 1.4–7.7)
NEUTROS PCT: 33.8 % — AB (ref 43.0–77.0)
PLATELETS: 326 10*3/uL (ref 150.0–400.0)
RBC: 5.14 Mil/uL — ABNORMAL HIGH (ref 3.87–5.11)
RDW: 17.4 % — AB (ref 11.5–15.5)
WBC: 3.6 10*3/uL — ABNORMAL LOW (ref 4.0–10.5)

## 2018-05-27 LAB — NITRIC OXIDE: NITRIC OXIDE: 5

## 2018-05-27 NOTE — Patient Instructions (Addendum)
Continue the [facility] and albuterol as needed We will check CBC differential, IgE levels, alpha-1 antitrypsin levels and phenotype Schedule pulmonary function test Follow-up in 3 months.

## 2018-05-27 NOTE — Progress Notes (Addendum)
Ann Woods    [id number]    [birth year]  Primary Care Physician:Henson, Laurian Brim, NP-C  Referring Physician: Girtha Rm, NP-C [place], [place] [address]  Chief complaint: Consult for dyspnea  HPI: 42 year old with history of obesity, BMI 56, hypertension Admitted to the hospital in July 2019 with dyspnea, bibasilar opacities.  She was treated for community-acquired pneumonia with antibiotics.  Initially treated with ceftriaxone, azithromycin and discharged on amoxicillin, azithromycin.  Urine strep and Legionella was negative  She was started on Dulera and albuterol inhaler by primary care post discharge.  States that her breathing has improved.  She still has occasional dyspnea with activity, minimal symptoms at rest with wheeze C/O Cough with white mucus production.  No fevers, chills.  She hardly needs to use her rescue inhaler. Her sats were reportedly 83% at her primary care.  She has seasonal allergies, no acid reflux.  Pets: Dog, has a parakeet at her place of work but exposure is minimal. 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  Previously worked at [employer] and [employer]. Exposures: No mold, hot tub, Jacuzzi. Smoking history: Smoked for about 6 months in the year 1995 Travel history: No significant travel Relevant family history: No significant family history of lung disease.  Outpatient Encounter Medications as of 05/27/2018  Medication Sig  . acetaminophen (TYLENOL) 325 MG tablet Take 650 mg by mouth every 6 (six) hours as needed.  [name] albuterol (PROVENTIL HFA;VENTOLIN HFA) 108 (90 Base) MCG/ACT inhaler Inhale 2 puffs into the lungs every 6 (six) hours as needed for wheezing or shortness of breath.  . loratadine (CLARITIN) 10 MG tablet Take 10 mg by mouth daily.  . mometasone-formoterol (DULERA) 100-5 MCG/ACT AERO Inhale 2 puffs into the lungs 2 (two) times daily.  . ferrous sulfate 325 (65 FE) MG tablet Take 1  tablet (325 mg total) by mouth daily with breakfast.   No facility-administered encounter medications on file as of 05/27/2018.     Allergies as of 05/27/2018  . (No Known Allergies)    Past Medical History:  Diagnosis Date  . Hypertension    G2 - preeclampsia, CHTN  . Obesity   . Prediabetes 04/01/2018  . Seasonal allergies     Past Surgical History:  Procedure Laterality Date  . CESAREAN SECTION    . HIP SURGERY    . TUBAL LIGATION Bilateral 05/05/2014   Procedure: POST PARTUM TUBAL LIGATION;  Surgeon: Donnamae Jude, MD;  Location: [facility];  Service: Gynecology;  Laterality: Bilateral;    Family History  Problem Relation Age of Onset  . Heart disease Mother   . Thyroid disease Mother   . COPD Father     Social History   Socioeconomic History  . Marital status: Significant Other    Spouse name: Not on file  . Number of children: Not on file  . Years of education: Not on file  . Highest education level: Not on file  Occupational History  . Not on file  Social Needs  . Financial resource strain: Not on file  . Food insecurity:    Worry: Not on file    Inability: Not on file  . Transportation needs:    Medical: Not on file    Non-medical: Not on file  Tobacco Use  . Smoking status: Former Smoker    Years: 0.50    Types: Cigarettes    Last attempt to quit: 08/25/1993  Years since quitting: 24.7  . Smokeless tobacco: Never Used  . Tobacco comment: one pack of cigarettes weekly- 05/27/18  Substance and Sexual Activity  . Alcohol use: Yes    Comment: occ  . Drug use: No  . Sexual activity: Yes    Birth control/protection: None  Lifestyle  . Physical activity:    Days per week: Not on file    Minutes per session: Not on file  . Stress: Not on file  Relationships  . Social connections:    Talks on phone: Not on file    Gets together: Not on file    Attends religious service: Not on file    Active member of club or organization: Not on file    Attends  meetings of clubs or organizations: Not on file    Relationship status: Not on file  . Intimate partner violence:    Fear of current or ex partner: Not on file    Emotionally abused: Not on file    Physically abused: Not on file    Forced sexual activity: Not on file  Other Topics Concern  . Not on file  Social History Narrative  . Not on file   Review of systems: Review of Systems  Constitutional: Negative for fever and chills.  HENT: Negative.   Eyes: Negative for blurred vision.  Respiratory: as per HPI  Cardiovascular: Negative for chest pain and palpitations.  Gastrointestinal: Negative for vomiting, diarrhea, blood per rectum. Genitourinary: Negative for dysuria, urgency, frequency and hematuria.  Musculoskeletal: Negative for myalgias, back pain and joint pain.  Skin: Negative for itching and rash.  Neurological: Negative for dizziness, tremors, focal weakness, seizures and loss of consciousness.  Endo/Heme/Allergies: Negative for environmental allergies.  Psychiatric/Behavioral: Negative for depression, suicidal ideas and hallucinations.  All other systems reviewed and are negative.  Physical Exam: Blood pressure 130/76, pulse 87, height 5' 4.75" (1.645 m), weight (!) 414 lb 12.8 oz (188.2 kg), SpO2 94 %. Gen:      No acute distress HEENT:  EOMI, sclera anicteric Neck:     No masses; no thyromegaly Lungs:    Clear to auscultation bilaterally; normal respiratory effort CV:         Regular rate and rhythm; no murmurs Abd:      + bowel sounds; soft, non-tender; no palpable masses, no distension Ext:    No edema; adequate peripheral perfusion Skin:      Warm and dry; no rash Neuro: alert and oriented x 3 Psych: normal mood and affect  Data Reviewed: Imaging: Chest x-ray 01/15/2010- vascular congestion with airspace opacities consistent with pulmonary edema Chest x-ray 02/08/2018- cardiomegaly, bilateral basal pulmonary opacities Chest x-ray 03/03/2018- interval clearance  of pulmonary opacities. I have reviewed the images personally.  PFTs: Spirometry 02/17/2018 FVC 2.5 [79%), FEV1 1.5 [56%], F/F 58 Moderate obstruction.  FENO 05/27/2018-less than 5  Labs: CBC 05/05/2018- WBC 4.7, eos 3%, absolute eosinophil count 141.  Cardiac Echocardiogram 02/19/2018 Mild LVH, LVEF 60-65%, high ventricular filling pressure.  Normal PA systolic pressure.  Assessment:  Consult for dyspnea, wheezing Evaluate for asthma, reactive airway disease. Check CBC differential, IgE levels and schedule full pulmonary function tests  Patient reports low O2 sats at her primary care however O2 levels remained in mid 90s on exertion in office today.  Continue monitoring Continue Dulera, albuterol.   Abnormal lung test Spirometry earlier this year shows moderate obstruction.  She has minimal smoking history. We will review full PFTs when available Check alpha  1 antitrypsin levels and phenotype.  Obesity Likely contributing to dyspnea on exertion. Referred to [facility] weight loss counseling by primary care.  Plan/Recommendations: - CBC, IgE, alpha-1 antitrypsin levels and phenotype - Pulmonary function tests - Continue Dulera, albuterol - Weight loss.  [name] Garfinkel MD [facility] 05/27/2018, 11:09 AM  CC: Harland Dingwall L, NP-C

## 2018-06-02 LAB — ALPHA-1 ANTITRYPSIN PHENOTYPE: A-1 Antitrypsin, Ser: 134 mg/dL (ref 83–199)

## 2018-06-02 LAB — IGE: IgE (Immunoglobulin E), Serum: 382 kU/L — ABNORMAL HIGH (ref <=114)

## 2018-06-04 ENCOUNTER — Telehealth: Payer: Self-pay | Admitting: Pulmonary Disease

## 2018-06-04 NOTE — Telephone Encounter (Signed)
ATC patient, unable to reach [facility].   "Notes recorded by [name] Garfinkel, MD on 06/03/2018 at 1:52 PM EDT Please let patient know labs show elevation in a value called IgE which is likely secondary to allergies. Rest of the labs look okay."

## 2018-06-07 NOTE — Telephone Encounter (Signed)
LMTCB

## 2018-06-08 ENCOUNTER — Ambulatory Visit (INDEPENDENT_AMBULATORY_CARE_PROVIDER_SITE_OTHER): Payer: Managed Care, Other (non HMO) | Admitting: Family Medicine

## 2018-06-08 ENCOUNTER — Encounter: Payer: Self-pay | Admitting: Family Medicine

## 2018-06-08 VITALS — BP 124/76 | HR 88 | Wt >= 6400 oz

## 2018-06-08 DIAGNOSIS — Z8619 Personal history of other infectious and parasitic diseases: Secondary | ICD-10-CM

## 2018-06-08 NOTE — Telephone Encounter (Signed)
Attempted to call pt but no answer. Left message for pt to return call x3.
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
## 2018-06-09 ENCOUNTER — Other Ambulatory Visit: Payer: Self-pay | Admitting: Family Medicine

## 2018-06-09 DIAGNOSIS — B1910 Unspecified viral hepatitis B without hepatic coma: Secondary | ICD-10-CM

## 2018-06-09 NOTE — Telephone Encounter (Signed)
Called and spoke to patient, made aware of results of labs. Voiced understanding. Nothing further is needed at this time.

## 2018-06-10 LAB — HEPATITIS B SURFACE ANTIGEN: Hepatitis B Surface Ag: POSITIVE — AB

## 2018-06-10 LAB — HBCIGM: Hep B C IgM: NEGATIVE

## 2018-06-10 LAB — HEPATITIS B SURFACE ANTIBODY, QUANTITATIVE: Hepatitis B Surf Ab Quant: <3.1 m[IU]/mL — ABNORMAL LOW (ref >9.9)

## 2018-06-10 LAB — HEPATITIS B DNA, ULTRAQUANTITATIVE, PCR
HBV DNA SERPL PCR-ACNC: 8100 IU/mL
HBV DNA SERPL PCR-LOG IU: 3.908 log10 IU/mL

## 2018-06-10 LAB — HEPATITIS B CORE AB W/REFLEX: Hep B Core Total Ab: POSITIVE — AB

## 2018-06-10 LAB — HEPATITIS A ANTIBODY, TOTAL: HEP A TOTAL AB: NEGATIVE

## 2018-06-10 LAB — HEPATITIS C ANTIBODY

## 2018-06-17 ENCOUNTER — Ambulatory Visit (INDEPENDENT_AMBULATORY_CARE_PROVIDER_SITE_OTHER): Payer: Managed Care, Other (non HMO) | Admitting: Internal Medicine

## 2018-06-17 ENCOUNTER — Encounter: Payer: Self-pay | Admitting: Internal Medicine

## 2018-06-17 VITALS — BP 138/98 | HR 72 | Temp 98.0°F | Ht 66.0 in | Wt >= 6400 oz

## 2018-06-17 DIAGNOSIS — B181 Chronic viral hepatitis B without delta-agent: Secondary | ICD-10-CM

## 2018-06-17 DIAGNOSIS — Z23 Encounter for immunization: Secondary | ICD-10-CM | POA: Diagnosis not present

## 2018-06-17 NOTE — Progress Notes (Signed)
[facility]      Reason for Consult: Chronic hepatitis B    Referring Physician: Mack Hook, NP    Patient ID: Ann Woods, female    DOB: [birth year], 42 y.o.   MRN: [id number]  HPI:   She is here for evaluation of known chronic hepatitis B.  She recalls a history of a needlestick about 10 years ago though was never checked for hepatitis B at that time.  She has been checked since including during her initial pregnancy in 2009 and subsequent pregnancies in 2013 and 15.  She has never been on treatment and was evaluated closely during her pregnancies but had very low level viremia.  She has never seen a specialist about hepatitis B.  Her most recent evaluation reveals a positive surface antigen with a viral DNA level of 8000 international units.  Her LFTs have remained within normal limits. Previous record reviewed from Epic and PCP and labs noted.  No history of E Ag testing.    Past Medical History:  Diagnosis Date  . Hypertension    G2 - preeclampsia, CHTN  . Obesity   . Prediabetes 04/01/2018  . Seasonal allergies     Prior to Admission medications   Medication Sig Start Date End Date Taking? Authorizing Provider  albuterol (PROVENTIL HFA;VENTOLIN HFA) 108 (90 Base) MCG/ACT inhaler Inhale 2 puffs into the lungs every 6 (six) hours as needed for wheezing or shortness of breath. 02/11/18   Elodia Florence., MD  ferrous sulfate 325 (65 FE) MG tablet Take 1 tablet (325 mg total) by mouth daily with breakfast. 02/11/18 06/08/18  Elodia Florence., MD  mometasone-formoterol (DULERA) 100-5 MCG/ACT AERO Inhale 2 puffs into the lungs 2 (two) times daily. 04/01/18   Henson, Vickie L, NP-C    No Known Allergies  Social History   Tobacco Use  . Smoking status: Former Smoker    Years: 0.50    Types: Cigarettes    Last attempt to quit: 08/25/1993    Years since quitting: 24.8  . Smokeless tobacco: Never Used  . Tobacco comment: one pack of  cigarettes weekly- 05/27/18  Substance Use Topics  . Alcohol use: Yes    Comment: occ  . Drug use: No    Family History  Problem Relation Age of Onset  . Heart disease Mother   . Thyroid disease Mother   . COPD Father     Review of Systems  Constitutional: negative for fevers, chills and anorexia Gastrointestinal: negative for nausea and diarrhea Integument/breast: negative for rash All other systems reviewed and are negative    Constitutional: in no apparent distress  Vitals:   06/17/18 0912  Weight: (!) 416 lb (188.7 kg)  Height: 5\' 6"  (1.676 m)   EYES: anicteric ENMT: no thrush Cardiovascular: Cor RRR Respiratory: CTA B; normal respiratory effort GI: Bowel sounds are normal, liver is not enlarged, spleen is not enlarged Musculoskeletal: no pedal edema noted Skin: negatives: no rash Hematologic: no cervical lad  Labs: Lab Results  Component Value Date   WBC 3.6 (L) 05/27/2018   HGB 12.1 05/27/2018   HCT 39.1 05/27/2018   MCV 76.0 (L) 05/27/2018   PLT 326.0 05/27/2018    Lab Results  Component Value Date   CREATININE 0.93 05/05/2018   BUN 11 05/05/2018   NA 141 05/05/2018   K 4.7 05/05/2018   CL 101 05/05/2018   CO2 25 05/05/2018    Lab Results  Component Value Date   ALT 14 05/05/2018   AST 17 05/05/2018   ALKPHOS 93 05/05/2018   BILITOT 0.6 05/05/2018   INR 1.09 02/08/2018     Assessment: Chronic hepatitis B.  Is unclear when she got this though may have been from her childhood and potentially even mother to child transmission as her mother is unaware of her status.  She has had a little bit more elevation of her DNA recently however is not been significant requiring treatment with normal AST and ALT levels under 30, though her E antigen status is unknown at this time.  We will also check an elastography to see if that shows any concerns. She has had a tubal ligation so no pregnancy risk now.    Plan: 1) E Ag and Ab 2) repeat DNA level 3)  ultrasound with elastography 4) HCC screening ongoing 5) hepatitis A vaccine series

## 2018-06-19 LAB — LIVER FIBROSIS, FIBROTEST-ACTITEST
ALPHA-2-MACROGLOBULIN: 186 mg/dL (ref 106–279)
ALT: 15 U/L (ref 6–29)
APOLIPOPROTEIN A1: 107 mg/dL (ref 101–198)
Bilirubin: 0.6 mg/dL (ref 0.2–1.2)
FIBROSIS SCORE: 0.16
GGT: 28 U/L (ref 3–55)
Haptoglobin: 208 mg/dL (ref 43–212)
Necroinflammat ACT Score: 0.04
Reference ID: 2697802

## 2018-06-19 LAB — HEPATITIS B DNA, ULTRAQUANTITATIVE, PCR
HEPATITIS B DNA (CALC): 3.8 {Log_IU}/mL — AB
Hepatitis B DNA: 6250 IU/mL — ABNORMAL HIGH

## 2018-06-19 LAB — HEPATITIS B E ANTIBODY: Hep B E Ab: REACTIVE — AB

## 2018-06-19 LAB — HEPATITIS B E ANTIGEN: HEP B E AG: NONREACTIVE

## 2018-06-23 ENCOUNTER — Ambulatory Visit (HOSPITAL_COMMUNITY)
Admission: RE | Admit: 2018-06-23 | Discharge: 2018-06-23 | Disposition: A | Discharge status: Home / Self Care | Payer: Managed Care, Other (non HMO) | Source: Ambulatory Visit | Attending: Internal Medicine | Admitting: Internal Medicine

## 2018-06-23 DIAGNOSIS — B181 Chronic viral hepatitis B without delta-agent: Secondary | ICD-10-CM | POA: Insufficient documentation

## 2018-06-23 DIAGNOSIS — K8020 Calculus of gallbladder without cholecystitis without obstruction: Secondary | ICD-10-CM | POA: Diagnosis not present

## 2018-07-15 ENCOUNTER — Encounter: Payer: Self-pay | Admitting: Internal Medicine

## 2018-07-15 ENCOUNTER — Ambulatory Visit (INDEPENDENT_AMBULATORY_CARE_PROVIDER_SITE_OTHER): Payer: Managed Care, Other (non HMO) | Admitting: Internal Medicine

## 2018-07-15 VITALS — BP 140/83 | HR 87 | Temp 98.0°F | Ht 66.0 in | Wt >= 6400 oz

## 2018-07-15 DIAGNOSIS — Z23 Encounter for immunization: Secondary | ICD-10-CM

## 2018-07-15 DIAGNOSIS — B181 Chronic viral hepatitis B without delta-agent: Secondary | ICD-10-CM

## 2018-07-15 DIAGNOSIS — D649 Anemia, unspecified: Secondary | ICD-10-CM

## 2018-07-15 DIAGNOSIS — K7400 Hepatic fibrosis, unspecified: Secondary | ICD-10-CM

## 2018-07-15 NOTE — Progress Notes (Signed)
   Subjective:    Patient ID: Ann Woods, female    DOB: 1975/09/15, 42 y.o.   MRN: [id number]  HPI Here for follow-up of hepatitis B. She has positive viral DNA first noted in 2015 and more recently noted to be in the thousands, at 6000 and 8000 most recently.  She has never been on treatment or had been evaluated.  She has no transaminitis and AST and ALT levels are both less than 20.  Patient had a positive surface in October of this year.  He did get on elastography and ultrasound and there is no cirrhosis noted on the ultrasound but the fibrosis score is F3 to 4.  Interestingly, her fibro-sure fibrosis score is F0 with a 0 for inflammatory activity.  Her platelets are normal and albumin has remained normal.  The hepatitis Be antigen is negative and the antibody is positive.  Her core total antibody is also positive with a negative IgM core antibody.   Review of Systems  Constitutional: Negative for fever.  Gastrointestinal: Negative for diarrhea and nausea.  Skin: Negative for rash.       Objective:   Physical Exam  Constitutional: She appears well-developed and well-nourished. No distress.  Eyes: No scleral icterus.  Cardiovascular: Normal rate, regular rhythm and normal heart sounds.  Pulmonary/Chest: Effort normal and breath sounds normal. No respiratory distress.  Skin: No rash noted.   SH: occasional alcohol       Assessment & Plan:

## 2018-07-15 NOTE — Assessment & Plan Note (Signed)
elastography noted as F3/4 but it does not at all correlate with the Fibrosure and with her labs.  I do not believe that her elastography represents true advanced liver fibrosis.  I will repeat the elastography in 1 year

## 2018-07-15 NOTE — Assessment & Plan Note (Addendum)
Her viral load is stable and no significant transaminitis, normal platelets.  E Ab positive and E Ag negative.  No indication for treatment and will continue monitoring.  She will return in 6 months with repeat labs.  Will do [place] screening

## 2018-07-15 NOTE — Assessment & Plan Note (Signed)
Recent labs with normal Hgb.

## 2018-08-10 ENCOUNTER — Encounter (INDEPENDENT_AMBULATORY_CARE_PROVIDER_SITE_OTHER): Payer: Managed Care, Other (non HMO)

## 2018-08-31 ENCOUNTER — Encounter (INDEPENDENT_AMBULATORY_CARE_PROVIDER_SITE_OTHER): Payer: Self-pay | Admitting: Bariatrics

## 2018-08-31 ENCOUNTER — Ambulatory Visit (INDEPENDENT_AMBULATORY_CARE_PROVIDER_SITE_OTHER): Payer: Managed Care, Other (non HMO) | Admitting: Bariatrics

## 2018-08-31 VITALS — BP 148/97 | HR 77 | Temp 97.7°F | Ht 65.0 in | Wt >= 6400 oz

## 2018-08-31 DIAGNOSIS — Z862 Personal history of diseases of the blood and blood-forming organs and certain disorders involving the immune mechanism: Secondary | ICD-10-CM

## 2018-08-31 DIAGNOSIS — R0602 Shortness of breath: Secondary | ICD-10-CM | POA: Diagnosis not present

## 2018-08-31 DIAGNOSIS — R030 Elevated blood-pressure reading, without diagnosis of hypertension: Secondary | ICD-10-CM | POA: Diagnosis not present

## 2018-08-31 DIAGNOSIS — Z6841 Body Mass Index (BMI) 40.0 and over, adult: Secondary | ICD-10-CM

## 2018-08-31 DIAGNOSIS — R5383 Other fatigue: Secondary | ICD-10-CM

## 2018-08-31 DIAGNOSIS — E559 Vitamin D deficiency, unspecified: Secondary | ICD-10-CM

## 2018-08-31 DIAGNOSIS — Z9189 Other specified personal risk factors, not elsewhere classified: Secondary | ICD-10-CM

## 2018-08-31 DIAGNOSIS — Z1331 Encounter for screening for depression: Secondary | ICD-10-CM

## 2018-08-31 DIAGNOSIS — R7303 Prediabetes: Secondary | ICD-10-CM

## 2018-08-31 DIAGNOSIS — E66813 Obesity, class 3: Secondary | ICD-10-CM | POA: Insufficient documentation

## 2018-08-31 DIAGNOSIS — Z0289 Encounter for other administrative examinations: Secondary | ICD-10-CM

## 2018-08-31 NOTE — Progress Notes (Signed)
Office: [phone]  /  Fax: [phone]   Dear Ann Woods Woods L. Raenette Rover, NP-C,   Thank you for referring Ann Woods Woods to our clinic. The following note includes my evaluation and treatment recommendations.  HPI:   Chief Complaint: OBESITY    Ann Woods Woods has been referred by Ann Woods L. Raenette Rover, NP-C for consultation regarding her obesity and obesity related comorbidities.    Ann Woods Woods (MR# [id number]) is a 43 y.o. female who presents on 08/31/2018 for obesity evaluation and treatment. Current BMI is Body mass index is 67.06 kg/m.[name] Ann Woods Woods has been struggling with her weight for many years and has been unsuccessful in either losing weight, maintaining weight loss, or reaching her healthy weight goal.     Ann Woods Woods attended our information session and states she is currently in the action stage of change and ready to dedicate time achieving and maintaining a healthier weight. Ann Woods Woods is interested in becoming our patient and working on intensive lifestyle modifications including (but not limited to) diet, exercise and weight loss.    Ann Woods Woods states her family eats meals together she thinks her family will eat healthier with  her her desired weight is to be out of the 300's she has been heavy most of  her life her heaviest weight ever was 403 lbs. she has significant food cravings issues  she snacks frequently in the evenings she skips breakfast every day she is trying to eat vegetarian she is frequently drinking liquids with calories she frequently makes poor food choices she frequently eats larger portions than normal  she has binge eating behaviors she struggles with emotional eating    Ann Woods Woods feels her energy is fair. This has worsened with weight gain and has not worsened recently. Ann Woods Woods admits to daytime somnolence and she admits to waking up still tired. Patient is at risk for obstructive sleep apnea. Patent has a history of symptoms of daytime Ann Woods,  morning Ann Woods and morning headache. Patient generally gets 6 or 7 hours of sleep per night, and states they generally have restful sleep. Snoring is present. Apneic episodes are not present. Epworth Sleepiness Score is 12  Dyspnea on exertion Ann Woods Woods notes increasing shortness of breath with exercising and seems to be worsening over time with weight gain. She notes getting out of breath sooner with certain activities (walking fast) than she used to. This has not gotten worse recently. Ann Woods Woods denies orthopnea.  Ann Woods Woods has a diagnosis of prediabetes and she has a family history of prediabetes (brothers) and was informed this puts her at greater risk of developing diabetes. She is not taking metformin currently and continues to work on diet and exercise to decrease risk of diabetes. She denies nausea or hypoglycemia.  At risk for diabetes Ann Woods Woods is at higher than average risk for developing diabetes due to her obesity and prediabetes. She currently denies polyuria or polydipsia.  History of Anemia, iron deficient (blood loss) Ann Woods Woods has heavy menstrual periods.  She is taking iron supplementation.   Vitamin D deficiency Ann Woods Woods has a diagnosis of vitamin D deficiency. She is not currently taking vit D and denies nausea, vomiting or muscle weakness.  Elevated Blood Pressure Ann Woods Woods is a 43 y.o. female with elevated blood pressure. After looking at her chart, she has some elevated readings, but they are not consistent. Ann Woods Woods denies chest pain. She is attempting to work on weight loss to help control her blood pressure with the goal of decreasing her risk of heart attack  and stroke. [name] blood pressure is not currently controlled.  Depression Screen Ann Woods Woods's Food and Mood (modified PHQ-9) score was  Depression screen PHQ 2/9 08/31/2018  Decreased Interest 1  Down, Depressed, Hopeless 1  PHQ - 2 Score 2  Altered sleeping 1  Tired, decreased energy 1    Change in appetite 0  Feeling bad or failure about yourself  0  Trouble concentrating 0  Moving slowly or fidgety/restless 0  Suicidal thoughts 0  PHQ-9 Score 4  Difficult doing work/chores Somewhat difficult    ASSESSMENT AND PLAN:  Other Ann Woods - Plan: EKG 12-Lead  Shortness of breath on exertion  History of iron deficiency anemia  Elevated blood pressure reading  Vitamin D deficiency - Plan: VITAMIN D 25 Hydroxy (Vit-D Deficiency, Fractures)  Prediabetes - Plan: Comprehensive metabolic panel, Hemoglobin A1c, Insulin, random  Depression screening  At risk for diabetes mellitus  Class 3 severe obesity with serious comorbidity and body mass index (BMI) of 60.0 to 69.9 in adult, unspecified obesity type (HCC)  PLAN:  Ann Woods Ann Woods Woods was informed that her Ann Woods may be related to obesity, depression or many other causes. Labs will be ordered, and in the meanwhile Ann Woods Woods has agreed to work on diet, exercise and weight loss to help with Ann Woods. Proper sleep hygiene was discussed including the need for 7-8 hours of quality sleep each night. A sleep study was not ordered based on symptoms and Epworth score.  Dyspnea on exertion Ann Woods Woods's shortness of breath appears to be obesity related and exercise induced. She has agreed to work on weight loss and continue exercise slowly to treat her exercise induced shortness of breath. If Ann Woods Woods follows our instructions and loses weight without improvement of her shortness of breath, we will plan to refer to pulmonology. We will monitor this condition regularly. Ann Woods Woods agrees to this plan.  Ann Woods Woods will continue to work on weight loss, exercise, increasing lean protein and decreasing simple carbohydrates in her diet to help decrease the risk of diabetes. She was informed that eating too many simple carbohydrates or too many calories at one sitting increases the likelihood of GI side effects. We will check Hgb A1c and fasting  insulin. Ann Woods Woods agreed to follow up with [place] as directed to monitor her progress.  Diabetes risk counseling Ann Woods Woods was given extended (15 minutes) diabetes prevention counseling today. She is 43 y.o. female and has risk factors for diabetes including obesity and prediabetes. We discussed intensive lifestyle modifications today with an emphasis on weight loss as well as increasing exercise and decreasing simple carbohydrates in her diet.  History of Anemia, iron deficient (blood loss) The diagnosis of Acute Blood Loss Anemia was discussed with Makenze and was explained in detail. She was given suggestions of iron rich foods. Jullian will continue with her OB-GYN and she will continue taking her iron supplement.   Vitamin D Deficiency Abiageal was informed that low vitamin D levels contributes to Ann Woods and are associated with obesity, breast, and colon cancer. We will check vitamin D level today and she will follow up for routine testing of vitamin D, at least 2-3 times per year. Shawny agrees to follow up with our clinic in 2 weeks.  Elevated Blood Pressure We discussed sodium restriction, working on healthy weight loss, and a regular exercise program as the means to achieve improved blood pressure control. Saba agreed with this plan and agreed to follow up as directed. We will continue to monitor her blood pressure and will begin medications if  needed.  Depression Screen Etsuko had a negative depression screening. Depression is commonly associated with obesity and often results in emotional eating behaviors. We will monitor this closely and work on CBT to help improve the non-hunger eating patterns.   Obesity Miaisabella is currently in the action stage of change and her goal is to continue with weight loss efforts. I recommend Bethann begin the structured treatment plan as follows:  She has agreed to follow the Pescatarian eating plan with vegetarian options Aylee has been instructed to  eventually work up to a goal of 150 minutes of combined cardio and strengthening exercise per week for weight loss and overall health benefits. We discussed the following Behavioral Modification Strategies today: increase H2O intake, keeping healthy foods in the home, increasing lean protein intake, decreasing simple carbohydrates, increasing vegetables and work on meal planning and easy cooking plans Addi will continue to pack her lunch. She will decrease meal skipping.   She was informed of the importance of frequent follow up visits to maximize her success with intensive lifestyle modifications for her multiple health conditions. She was informed we would discuss her lab results at her next visit unless there is a critical issue that needs to be addressed sooner. Asmaa agreed to keep her next visit at the agreed upon time to discuss these results.  ALLERGIES: No Known Allergies  MEDICATIONS: Current Outpatient Medications on File Prior to Visit  Medication Sig Dispense Refill   albuterol (PROVENTIL HFA;VENTOLIN HFA) 108 (90 Base) MCG/ACT inhaler Inhale 2 puffs into the lungs every 6 (six) hours as needed for wheezing or shortness of breath. 1 Inhaler 0   mometasone-formoterol (DULERA) 100-5 MCG/ACT AERO Inhale 2 puffs into the lungs 2 (two) times daily. 1 Inhaler 1   [name] (CYANOCOBALAMIN) 500 MCG tablet Take 500 mcg by mouth daily.     zinc gluconate 50 MG tablet Take 50 mg by mouth daily.     ferrous sulfate 325 (65 FE) MG tablet Take 1 tablet (325 mg total) by mouth daily with breakfast. 30 tablet 0   [name].     PAST MEDICAL HISTORY: Past Medical History:  Diagnosis Date   Anemia    Asthma    Hepatitis B    High cholesterol    Hypertension    G2 - preeclampsia, CHTN   Infertility, female    Lactose intolerance    Obesity    Obesity    Prediabetes 04/01/2018   Seasonal allergies     Shortness of breath    Swelling of both lower extremities     PAST SURGICAL HISTORY: Past Surgical History:  Procedure Laterality Date   CESAREAN SECTION     HIP SURGERY     TUBAL LIGATION Bilateral 05/05/2014   Procedure: POST PARTUM TUBAL LIGATION;  Surgeon: Donnamae Jude, MD;  Location: [facility];  Service: Gynecology;  Laterality: Bilateral;    SOCIAL HISTORY: Social History   Tobacco Use   Smoking status: Former Smoker    Years: 0.50    Types: Cigarettes    Last attempt to quit: 08/25/1993    Years since quitting: 25.0   Smokeless tobacco: Never Used   Tobacco comment: one pack of cigarettes weekly- 05/27/18  Substance Use Topics   Alcohol use: Yes    Comment: occ   Drug use: No    FAMILY HISTORY: Family History  Problem Relation Age of Onset   Heart disease Mother  Thyroid disease Mother    High blood pressure Mother    Obesity Mother    COPD Father     ROS: Review of Systems  Constitutional: Positive for malaise/Ann Woods.  HENT: Positive for congestion (nasal stuffiness) and sinus pain.   Eyes:       + Wear Glasses or Contacts  Respiratory: Positive for shortness of breath and wheezing.   Cardiovascular: Negative for chest pain and orthopnea.       + Shortness of Breath with Activity  Gastrointestinal: Negative for nausea.  Genitourinary: Negative for frequency.  Musculoskeletal: Positive for back pain.  Skin: Positive for itching.       + Dryness  Neurological: Positive for headaches.  Endo/Heme/Allergies: Negative for polydipsia.       Negative for hypoglycemia    PHYSICAL EXAM: Blood pressure (!) 148/97, pulse 77, temperature 97.7 F (36.5 C), temperature source Oral, height 5\' 5"  (1.651 m), weight (!) 403 lb (182.8 kg), last menstrual period 08/20/2018, SpO2 98 %. Body mass index is 67.06 kg/m. Physical Exam Vitals signs reviewed.  Constitutional:      Appearance: Normal appearance. She is well-developed. She is obese.  HENT:      Head: Normocephalic and atraumatic.     Nose: Nose normal.  Eyes:     General: No scleral icterus.    Extraocular Movements: Extraocular movements intact.  Neck:     Musculoskeletal: Normal range of motion and neck supple.     Thyroid: No thyromegaly.  Cardiovascular:     Rate and Rhythm: Normal rate and regular rhythm.  Pulmonary:     Effort: Pulmonary effort is normal. No respiratory distress.  Abdominal:     Palpations: Abdomen is soft.     Tenderness: There is no abdominal tenderness.  Musculoskeletal: Normal range of motion.     Right lower leg: Edema (trace) present.     Left lower leg: Edema (trace) present.     Comments: Range of Motion normal in all 4 extremities  Skin:    General: Skin is warm and dry.  Neurological:     Mental Status: She is alert and oriented to person, place, and time.     Coordination: Coordination normal.  Psychiatric:        Mood and Affect: Mood normal.        Behavior: Behavior normal.     RECENT LABS AND TESTS: BMET    Component Value Date/Time   NA 141 05/05/2018 1009   K 4.7 05/05/2018 1009   CL 101 05/05/2018 1009   CO2 25 05/05/2018 1009   GLUCOSE 104 (H) 05/05/2018 1009   GLUCOSE 116 (H) 02/11/2018 0314   BUN 11 05/05/2018 1009   CREATININE 0.93 05/05/2018 1009   CREATININE 0.61 03/20/2014 1652   CALCIUM 9.4 05/05/2018 1009   GFRNONAA 76 05/05/2018 1009   GFRAA 88 05/05/2018 1009   Lab Results  Component Value Date   HGBA1C 5.7 (H) 03/02/2018   No results found for: INSULIN CBC    Component Value Date/Time   WBC 3.6 (L) 05/27/2018 1144   RBC 5.14 (H) 05/27/2018 1144   HGB 12.1 05/27/2018 1144   HGB 11.3 05/05/2018 1009   HGB 10.4 11/21/2013   HCT 39.1 05/27/2018 1144   HCT 38.3 05/05/2018 1009   HCT 33 11/21/2013   PLT 326.0 05/27/2018 1144   PLT 312 05/05/2018 1009   PLT 306 11/21/2013   MCV 76.0 (L) 05/27/2018 1144   MCV 76 (L) 05/05/2018 1009  MCH 22.5 (L) 05/05/2018 1009   MCH 18.8 (L) 02/11/2018 0314     MCHC 31.0 05/27/2018 1144   RDW 17.4 (H) 05/27/2018 1144   RDW 18.1 (H) 05/05/2018 1009   LYMPHSABS 1.8 05/27/2018 1144   LYMPHSABS 1.4 05/05/2018 1009   MONOABS 0.5 05/27/2018 1144   EOSABS 0.1 05/27/2018 1144   EOSABS 0.1 05/05/2018 1009   BASOSABS 0.0 05/27/2018 1144   BASOSABS 0.0 05/05/2018 1009   Iron/TIBC/Ferritin/ %Sat    Component Value Date/Time   IRON 17 (L) 02/09/2018 0110   TIBC 465 (H) 02/09/2018 0110   FERRITIN 22 02/09/2018 0110   IRONPCTSAT 4 (L) 02/09/2018 0110   Lipid Panel     Component Value Date/Time   CHOL 160 03/02/2018 0954   TRIG 75 03/02/2018 0954   HDL 48 03/02/2018 0954   CHOLHDL 3.3 03/02/2018 0954   CHOLHDL 3.8 Ratio 05/07/2009 2031   VLDL 14 05/07/2009 2031   LDLCALC 97 03/02/2018 0954   Hepatic Function Panel     Component Value Date/Time   PROT 7.1 05/05/2018 1009   ALBUMIN 3.8 05/05/2018 1009   AST 17 05/05/2018 1009   ALT 15 06/17/2018 0939   ALKPHOS 93 05/05/2018 1009   BILITOT 0.6 05/05/2018 1009      Component Value Date/Time   TSH 3.140 03/02/2018 0954   TSH 1.950 02/17/2018 1631   TSH 1.511 05/07/2009 2031    ECG  shows NSR with a rate of 79 BPM INDIRECT CALORIMETER done today shows a VO2 of 391 and a REE of 2724.  Her calculated basal metabolic rate is 6720 thus her basal metabolic rate is better than expected.       OBESITY BEHAVIORAL INTERVENTION VISIT  Today's visit was # 1   Starting weight: 403 lbs Starting date: 08/31/2018 Today's weight : 403 lbs  Today's date: 08/31/2018 Total lbs lost to date: 0 At least 15 minutes were spent on discussing the following behavioral intervention visit.   ASK: We discussed the diagnosis of obesity with [name] today and Ena agreed to give [place] permission to discuss obesity behavioral modification therapy today.  ASSESS: Keana has the diagnosis of obesity and her BMI today is 67.06 Rhealynn is in the action stage of change   ADVISE: Amanda was  educated on the multiple health risks of obesity as well as the benefit of weight loss to improve her health. She was advised of the need for long term treatment and the importance of lifestyle modifications to improve her current health and to decrease her risk of future health problems.  AGREE: Multiple dietary modification options and treatment options were discussed and  Doriann agreed to follow the recommendations documented in the above note.  ARRANGE: Evanell was educated on the importance of frequent visits to treat obesity as outlined per CMS and USPSTF guidelines and agreed to schedule her next follow up appointment today.  Corey Skains, am acting as Location manager for [employer]. Owens Shark, DO  I have reviewed the above documentation for accuracy and completeness, and I agree with the above. -Jearld Lesch, DO

## 2018-09-01 LAB — COMPREHENSIVE METABOLIC PANEL
A/G RATIO: 1.3 (ref 1.2–2.2)
ALT: 16 IU/L (ref 0–32)
AST: 18 IU/L (ref 0–40)
Albumin: 4.1 g/dL (ref 3.5–5.5)
Alkaline Phosphatase: 96 IU/L (ref 39–117)
BILIRUBIN TOTAL: 0.9 mg/dL (ref 0.0–1.2)
BUN/Creatinine Ratio: 5 — ABNORMAL LOW (ref 9–23)
BUN: 4 mg/dL — ABNORMAL LOW (ref 6–24)
CHLORIDE: 99 mmol/L (ref 96–106)
CO2: 26 mmol/L (ref 20–29)
Calcium: 9.5 mg/dL (ref 8.7–10.2)
Creatinine, Ser: 0.75 mg/dL (ref 0.57–1.00)
GFR calc non Af Amer: 99 mL/min/{1.73_m2} (ref >59)
GFR, EST AFRICAN AMERICAN: 114 mL/min/{1.73_m2} (ref >59)
Globulin, Total: 3.1 g/dL (ref 1.5–4.5)
Glucose: 86 mg/dL (ref 65–99)
POTASSIUM: 4.6 mmol/L (ref 3.5–5.2)
Sodium: 142 mmol/L (ref 134–144)
Total Protein: 7.2 g/dL (ref 6.0–8.5)

## 2018-09-01 LAB — INSULIN, RANDOM: INSULIN: 10.4 u[IU]/mL (ref 2.6–24.9)

## 2018-09-01 LAB — VITAMIN D 25 HYDROXY (VIT D DEFICIENCY, FRACTURES): Vit D, 25-Hydroxy: <4 ng/mL — ABNORMAL LOW (ref 30.0–100.0)

## 2018-09-01 LAB — HEMOGLOBIN A1C
ESTIMATED AVERAGE GLUCOSE: 117 mg/dL
Hgb A1c MFr Bld: 5.7 % — ABNORMAL HIGH (ref 4.8–5.6)

## 2018-09-08 ENCOUNTER — Encounter: Payer: Self-pay | Admitting: Pulmonary Disease

## 2018-09-08 ENCOUNTER — Ambulatory Visit (INDEPENDENT_AMBULATORY_CARE_PROVIDER_SITE_OTHER): Payer: Managed Care, Other (non HMO) | Admitting: Pulmonary Disease

## 2018-09-08 VITALS — BP 142/88 | HR 86 | Ht 65.0 in | Wt >= 6400 oz

## 2018-09-08 DIAGNOSIS — R0609 Other forms of dyspnea: Secondary | ICD-10-CM

## 2018-09-08 LAB — PULMONARY FUNCTION TEST
DL/VA % PRED: 112 %
DL/VA: 5.55 ml/min/mmHg/L
DLCO UNC % PRED: 84 %
DLCO UNC: 21.69 ml/min/mmHg
FEF 25-75 POST: 2.57 L/s
FEF 25-75 PRE: 2.59 L/s
FEF2575-%Change-Post: 0 %
FEF2575-%PRED-POST: 90 %
FEF2575-%PRED-PRE: 91 %
FEV1-%Change-Post: -3 %
FEV1-%Pred-Post: 83 %
FEV1-%Pred-Pre: 86 %
FEV1-Post: 2.16 L
FEV1-Pre: 2.23 L
FEV1FVC-%Change-Post: 0 %
FEV1FVC-%PRED-PRE: 102 %
FEV6-%CHANGE-POST: -1 %
FEV6-%Pred-Post: 81 %
FEV6-%Pred-Pre: 82 %
FEV6-POST: 2.53 L
FEV6-Pre: 2.56 L
FEV6FVC-%PRED-POST: 102 %
FEV6FVC-%Pred-Pre: 102 %
FVC-%Change-Post: -3 %
FVC-%PRED-POST: 79 %
FVC-%Pred-Pre: 82 %
FVC-POST: 2.53 L
FVC-Pre: 2.62 L
POST FEV6/FVC RATIO: 100 %
PRE FEV1/FVC RATIO: 85 %
Post FEV1/FVC ratio: 85 %
Pre FEV6/FVC Ratio: 100 %
RV % pred: 67 %
RV: 1.13 L
TLC % PRED: 74 %
TLC: 3.85 L

## 2018-09-08 NOTE — Patient Instructions (Signed)
I am glad that your breathing is improved Continue to work on weight loss Your PFTs do not show any clear evidence of COPD or asthma We will stop the inhalers and observe symptoms Follow-up in 6 months.

## 2018-09-08 NOTE — Progress Notes (Signed)
PFT done today. 

## 2018-09-08 NOTE — Progress Notes (Signed)
Ann Woods    [id number]    13-Oct-1975  Primary Care Physician:Henson, Laurian Brim, NP-C  Referring Physician: Girtha Rm, NP-C [place], [place] [id number]  Chief complaint: Follow-up for for dyspnea  HPI: 43 year old with history of obesity, BMI 27, hypertension Admitted to the hospital in July 2019 with dyspnea, bibasilar opacities.  She was treated for community-acquired pneumonia with antibiotics.  Initially treated with ceftriaxone, azithromycin and discharged on amoxicillin, azithromycin.  Urine strep and Legionella was negative  She was started on Dulera and albuterol inhaler by primary care post discharge.  States that her breathing has improved.  She still has occasional dyspnea with activity, minimal symptoms at rest with wheeze C/O Cough with white mucus production.  No fevers, chills.  She hardly needs to use her rescue inhaler. Her sats were reportedly 83% at her primary care.  She has seasonal allergies, no acid reflux.  Pets: Dog, has a parakeet at her place of work but exposure is minimal. 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  Previously worked at [employer] and [employer]. Exposures: No mold, hot tub, Jacuzzi. Smoking history: Smoked for about 6 months in the year 1995 Travel history: No significant travel Relevant family history: No significant family history of lung disease.  Interim history: States that breathing is doing much better.  She hardly needs to use her rescue inhaler She is enrolled in a weight loss program and has lost 36 pounds since last visit.  Outpatient Encounter Medications as of 09/08/2018  Medication Sig  . albuterol (PROVENTIL HFA;VENTOLIN HFA) 108 (90 Base) MCG/ACT inhaler Inhale 2 puffs into the lungs every 6 (six) hours as needed for wheezing or shortness of breath.  . ferrous sulfate 325 (65 FE) MG tablet Take 1 tablet (325 mg total) by mouth daily with breakfast.  .  mometasone-formoterol (DULERA) 100-5 MCG/ACT AERO Inhale 2 puffs into the lungs 2 (two) times daily.  . [name] (CYANOCOBALAMIN) 500 MCG tablet Take 500 mcg by mouth daily.  [name] zinc gluconate 50 MG tablet Take 50 mg by mouth daily.   No facility-administered encounter medications on file as of 09/08/2018.    Physical Exam: Blood pressure (!) 142/88, pulse 86, height 5\' 5"  (1.651 m), weight (!) 405 lb (183.7 kg), last menstrual period 08/20/2018, SpO2 97 %. Gen:      No acute distress HEENT:  EOMI, sclera anicteric Neck:     No masses; no thyromegaly Lungs:    Clear to auscultation bilaterally; normal respiratory effort CV:         Regular rate and rhythm; no murmurs Abd:      + bowel sounds; soft, non-tender; no palpable masses, no distension Ext:    No edema; adequate peripheral perfusion Skin:      Warm and dry; no rash Neuro: alert and oriented x 3 Psych: normal mood and affect  Data Reviewed: Imaging: Chest x-ray 01/15/2010- vascular congestion with airspace opacities consistent with pulmonary edema Chest x-ray 02/08/2018- cardiomegaly, bilateral basal pulmonary opacities Chest x-ray 03/03/2018- interval clearance of pulmonary opacities. I have reviewed the images personally.  PFTs: Spirometry 02/17/2018 FVC 2.5 [79%), FEV1 1.5 [56%], F/F 58 Moderate obstruction.  PFTs 09/08/2018 FVC 2.53 [79%), FEV1 2.16 [83%],/55, TLC 74%, ERV 21%, DLCO 84% Minimal restriction.  FENO 05/27/2018-less than 5  Labs: CBC 05/05/2018- WBC 4.7, eos 3%, absolute eosinophil count 141. CBC 05/27/2018-WBC 3.6, eos 4%, absolute eosinophil count 144 IgE  05/27/2018-382 Alpha-1 antitrypsin 05/27/2018- 382, PIMM  Cardiac Echocardiogram 02/19/2018 Mild LVH, LVEF 60-65%, high ventricular filling pressure.  Normal PA systolic pressure.  Assessment:  Follow-up for dyspnea, wheezing after episode of pneumonia Has improved with respiratory status There is no objective evidence of asthma or COPD on her  work-up so far There is minimal restriction which is likely due to body habitus.  No evidence of interstitial lung disease  Stop all inhalers and observe.   Abnormal lung test Spirometry earlier this year shows moderate obstruction.  She has minimal smoking history. Repat testing with full PFTs do not show obstruction.  Obesity Likely contributing to dyspnea on exertion. Congratulated on losing 36 pounds. Continue weight loss program.  Plan/Recommendations: - Stop inhalers - Weight loss.  [name] Garfinkel MD [facility] 09/08/2018, 10:36 AM  CC: Girtha Rm, NP-C

## 2018-09-14 ENCOUNTER — Ambulatory Visit (INDEPENDENT_AMBULATORY_CARE_PROVIDER_SITE_OTHER): Payer: Managed Care, Other (non HMO) | Admitting: Bariatrics

## 2018-09-14 ENCOUNTER — Encounter (INDEPENDENT_AMBULATORY_CARE_PROVIDER_SITE_OTHER): Payer: Self-pay | Admitting: Bariatrics

## 2018-09-14 VITALS — BP 149/89 | HR 84 | Temp 97.6°F | Ht 65.0 in | Wt 399.0 lb

## 2018-09-14 DIAGNOSIS — R7303 Prediabetes: Secondary | ICD-10-CM | POA: Diagnosis not present

## 2018-09-14 DIAGNOSIS — E559 Vitamin D deficiency, unspecified: Secondary | ICD-10-CM | POA: Diagnosis not present

## 2018-09-14 DIAGNOSIS — Z8639 Personal history of other endocrine, nutritional and metabolic disease: Secondary | ICD-10-CM

## 2018-09-14 DIAGNOSIS — Z6841 Body Mass Index (BMI) 40.0 and over, adult: Secondary | ICD-10-CM

## 2018-09-14 DIAGNOSIS — Z9189 Other specified personal risk factors, not elsewhere classified: Secondary | ICD-10-CM

## 2018-09-14 MED ORDER — VITAMIN D (ERGOCALCIFEROL) 1.25 MG (50000 UNIT) PO CAPS: 50000.0000 [IU] | ORAL_CAPSULE | ORAL | 0 refills | Status: DC

## 2018-09-15 LAB — VITAMIN B12: VITAMIN B 12: 351 pg/mL (ref 232–1245)

## 2018-09-15 LAB — FOLATE: Folate: 5.2 ng/mL (ref >3.0)

## 2018-09-15 NOTE — Progress Notes (Signed)
Office: [phone]  /  Fax: [phone]   HPI:   Chief Complaint: OBESITY Ann Woods is here to discuss her progress with her obesity treatment plan. She is on the Pescatarian eating plan with vegetarian options and is following her eating plan approximately 100 % of the time. She states she is walking for 15 minutes 7 times per week. Ann Woods states that she did well with the meal. It was hard for her to eat breakfast (picked eggs). She did not feel hungry, but she had some cravings for starches (chips), mainly at noon. Her weight is (!) 399 lb (181 kg) today and has had a weight loss of 4 pounds over a period of 2 weeks since her last visit. She has lost 4 lbs since starting treatment with [place].  Vitamin D deficiency Ann Woods has a diagnosis of vitamin D deficiency. Her last level was <4.0 She is not currently taking vit D and denies nausea, vomiting or muscle weakness.  Pre-Diabetes Ann Woods has a diagnosis of prediabetes based on her elevated Hgb A1c and was informed this puts her at greater risk of developing diabetes. Her last A1c was at 5.7 and last insulin level was at 10.4 She is not taking medications currently and continues to work on diet and exercise to decrease risk of diabetes. She denies nausea or hypoglycemia.  At risk for diabetes Ann Woods is at higher than average risk for developing diabetes due to her obesity and prediabetes. She currently denies polyuria or polydipsia.  History of B12 Deficiency Ann Woods has a history of B12 insufficiency. Ann Woods has a history of being a vegetarian. She does not have a history of weight loss surgery.   ASSESSMENT AND PLAN:  Vitamin D deficiency - Plan: Vitamin D, Ergocalciferol, (DRISDOL) 1.25 MG (50000 UT) CAPS capsule  Prediabetes  History of non anemic vitamin B12 deficiency - Plan: Vitamin B12, Folate  At risk for diabetes mellitus  Class 3 severe obesity with serious comorbidity and body mass index (BMI) of 60.0 to 69.9 in  adult, unspecified obesity type (Ann Woods)  PLAN:  Vitamin D Deficiency [place] was informed that low vitamin D levels contributes to fatigue and are associated with obesity, breast, and colon cancer. She agrees to continue to take prescription Vit D @50 ,000 IU every week and will follow up for routine testing of vitamin D, at least 2-3 times per year. She was informed of the risk of over-replacement of vitamin D and agrees to not increase her dose unless she discusses this with [place] first.  Pre-Diabetes Ann Woods will continue to work on weight loss, exercise, increasing lean protein and decreasing simple carbohydrates in her diet to help decrease the risk of diabetes. She was informed that eating too many simple carbohydrates or too many calories at one sitting increases the likelihood of GI side effects. Ann Woods agreed to follow up with [place] as directed to monitor her progress.  Diabetes risk counseling Ann Woods was given extended (15 minutes) diabetes prevention counseling today. She is 43 y.o. female and has risk factors for diabetes including obesity and prediabetes. We discussed intensive lifestyle modifications today with an emphasis on weight loss as well as increasing exercise and decreasing simple carbohydrates in her diet.  History of B12 Deficiency Ann Woods will work on increasing B12 rich foods in her diet. B12 supplementation was not prescribed today. We will check B12 level and folate and Ann Woods will follow up as directed.  Obesity Ann Woods is currently in the action stage of change. As such, her goal  is to continue with weight loss efforts She has agreed to follow the Pescatarian eating plan with vegetarian options Ann Woods has been instructed to work up to a goal of 150 minutes of combined cardio and strengthening exercise per week for weight loss and overall health benefits. We discussed the following Behavioral Modification Strategies today: increase H2O intake, keeping healthy foods in  the home, planning for success, increasing lean protein intake, decreasing simple carbohydrates and increasing vegetables  Ann Woods has agreed to follow up with our clinic in 2 weeks. She was informed of the importance of frequent follow up visits to maximize her success with intensive lifestyle modifications for her multiple health conditions.  ALLERGIES: No Known Allergies  MEDICATIONS: Current Outpatient Medications on File Prior to Visit  Medication Sig Dispense Refill  . albuterol (PROVENTIL HFA;VENTOLIN HFA) 108 (90 Base) MCG/ACT inhaler Inhale 2 puffs into the lungs every 6 (six) hours as needed for wheezing or shortness of breath. 1 Inhaler 0  . mometasone-formoterol (DULERA) 100-5 MCG/ACT AERO Inhale 2 puffs into the lungs 2 (two) times daily. 1 Inhaler 1  . [name] (CYANOCOBALAMIN) 500 MCG tablet Take 500 mcg by mouth daily.    [name] zinc gluconate 50 MG tablet Take 50 mg by mouth daily.    . ferrous sulfate 325 (65 FE) MG tablet Take 1 tablet (325 mg total) by mouth daily with breakfast. 30 tablet 0   [name].     PAST MEDICAL HISTORY: Past Medical History:  Diagnosis Date  . Anemia   . Asthma   . Hepatitis B   . High cholesterol   . Hypertension    G2 - preeclampsia, CHTN  . Infertility, female   . Lactose intolerance   . Obesity   . Obesity   . Prediabetes 04/01/2018  . Seasonal allergies   . Shortness of breath   . Swelling of both lower extremities     PAST SURGICAL HISTORY: Past Surgical History:  Procedure Laterality Date  . CESAREAN SECTION    . HIP SURGERY    . TUBAL LIGATION Bilateral 05/05/2014   Procedure: POST PARTUM TUBAL LIGATION;  Surgeon: Donnamae Jude, MD;  Location: [facility];  Service: Gynecology;  Laterality: Bilateral;    SOCIAL HISTORY: Social History   Tobacco Use  . Smoking status: Former Smoker    Years: 0.50    Types: Cigarettes    Last attempt to quit: 08/25/1993    Years  since quitting: 25.0  . Smokeless tobacco: Never Used  . Tobacco comment: one pack of cigarettes weekly- 05/27/18  Substance Use Topics  . Alcohol use: Yes    Comment: occ  . Drug use: No    FAMILY HISTORY: Family History  Problem Relation Age of Onset  . Heart disease Mother   . Thyroid disease Mother   . High blood pressure Mother   . Obesity Mother   . COPD Father     ROS: Review of Systems  Constitutional: Positive for weight loss.  Gastrointestinal: Negative for nausea and vomiting.  Genitourinary: Negative for frequency.  Musculoskeletal:       Negative for muscle weakness  Endo/Heme/Allergies: Negative for polydipsia.       Negative for hypoglycemia    PHYSICAL EXAM: Blood pressure (!) 149/89, pulse 84, temperature 97.6 F (36.4 C), temperature source Oral, height 5\' 5"  (1.651 m), weight (!) 399 lb (181 kg), last menstrual period 08/20/2018, SpO2 95 %. Body mass index is  66.4 kg/m. Physical Exam Vitals signs reviewed.  Constitutional:      Appearance: Normal appearance. She is well-developed. She is obese.  Cardiovascular:     Rate and Rhythm: Normal rate.  Pulmonary:     Effort: Pulmonary effort is normal.  Musculoskeletal: Normal range of motion.  Skin:    General: Skin is warm and dry.  Neurological:     Mental Status: She is alert and oriented to person, place, and time.  Psychiatric:        Mood and Affect: Mood normal.        Behavior: Behavior normal.     RECENT LABS AND TESTS: BMET    Component Value Date/Time   NA 142 08/31/2018 1256   K 4.6 08/31/2018 1256   CL 99 08/31/2018 1256   CO2 26 08/31/2018 1256   GLUCOSE 86 08/31/2018 1256   GLUCOSE 116 (H) 02/11/2018 0314   BUN 4 (L) 08/31/2018 1256   CREATININE 0.75 08/31/2018 1256   CREATININE 0.61 03/20/2014 1652   CALCIUM 9.5 08/31/2018 1256   GFRNONAA 99 08/31/2018 1256   GFRAA 114 08/31/2018 1256   Lab Results  Component Value Date   HGBA1C 5.7 (H) 08/31/2018   HGBA1C 5.7 (H)  03/02/2018   Lab Results  Component Value Date   INSULIN 10.4 08/31/2018   CBC    Component Value Date/Time   WBC 3.6 (L) 05/27/2018 1144   RBC 5.14 (H) 05/27/2018 1144   HGB 12.1 05/27/2018 1144   HGB 11.3 05/05/2018 1009   HGB 10.4 11/21/2013   HCT 39.1 05/27/2018 1144   HCT 38.3 05/05/2018 1009   HCT 33 11/21/2013   PLT 326.0 05/27/2018 1144   PLT 312 05/05/2018 1009   PLT 306 11/21/2013   MCV 76.0 (L) 05/27/2018 1144   MCV 76 (L) 05/05/2018 1009   MCH 22.5 (L) 05/05/2018 1009   MCH 18.8 (L) 02/11/2018 0314   MCHC 31.0 05/27/2018 1144   RDW 17.4 (H) 05/27/2018 1144   RDW 18.1 (H) 05/05/2018 1009   LYMPHSABS 1.8 05/27/2018 1144   LYMPHSABS 1.4 05/05/2018 1009   MONOABS 0.5 05/27/2018 1144   EOSABS 0.1 05/27/2018 1144   EOSABS 0.1 05/05/2018 1009   BASOSABS 0.0 05/27/2018 1144   BASOSABS 0.0 05/05/2018 1009   Iron/TIBC/Ferritin/ %Sat    Component Value Date/Time   IRON 17 (L) 02/09/2018 0110   TIBC 465 (H) 02/09/2018 0110   FERRITIN 22 02/09/2018 0110   IRONPCTSAT 4 (L) 02/09/2018 0110   Lipid Panel     Component Value Date/Time   CHOL 160 03/02/2018 0954   TRIG 75 03/02/2018 0954   HDL 48 03/02/2018 0954   CHOLHDL 3.3 03/02/2018 0954   CHOLHDL 3.8 Ratio 05/07/2009 2031   VLDL 14 05/07/2009 2031   LDLCALC 97 03/02/2018 0954   Hepatic Function Panel     Component Value Date/Time   PROT 7.2 08/31/2018 1256   ALBUMIN 4.1 08/31/2018 1256   AST 18 08/31/2018 1256   ALT 16 08/31/2018 1256   ALT 15 06/17/2018 0939   ALKPHOS 96 08/31/2018 1256   BILITOT 0.9 08/31/2018 1256      Component Value Date/Time   TSH 3.140 03/02/2018 0954   TSH 1.950 02/17/2018 1631   TSH 1.511 05/07/2009 2031     Ref. Range 08/31/2018 12:56  Vitamin D, 25-Hydroxy Latest Ref Range: 30.0 - 100.0 ng/mL <4.0 (L)     OBESITY BEHAVIORAL INTERVENTION VISIT  Today's visit was # 2   Starting  weight: 403 lbs Starting date: 08/31/2018 Today's weight : 399 lbs  Today's date:  09/14/2018 Total lbs lost to date: 4   ASK: We discussed the diagnosis of obesity with Ann Woods today and Ann Woods agreed to give [place] permission to discuss obesity behavioral modification therapy today.  ASSESS: Ann Woods has the diagnosis of obesity and her BMI today is 61.4 Ann Woods is in the action stage of change   ADVISE: Ann Woods was educated on the multiple health risks of obesity as well as the benefit of weight loss to improve her health. She was advised of the need for long term treatment and the importance of lifestyle modifications to improve her current health and to decrease her risk of future health problems.  AGREE: Multiple dietary modification options and treatment options were discussed and  Ann Woods agreed to follow the recommendations documented in the above note.  ARRANGE: Ann Woods was educated on the importance of frequent visits to treat obesity as outlined per CMS and USPSTF guidelines and agreed to schedule her next follow up appointment today.  Corey Skains, am acting as Location manager for [employer]. Owens Shark, DO  I have reviewed the above documentation for accuracy and completeness, and I agree with the above. -Jearld Lesch, DO

## 2018-09-27 ENCOUNTER — Encounter (INDEPENDENT_AMBULATORY_CARE_PROVIDER_SITE_OTHER): Payer: Self-pay | Admitting: Bariatrics

## 2018-09-27 ENCOUNTER — Ambulatory Visit (INDEPENDENT_AMBULATORY_CARE_PROVIDER_SITE_OTHER): Payer: Managed Care, Other (non HMO) | Admitting: Bariatrics

## 2018-09-27 VITALS — BP 156/82 | HR 87 | Temp 98.3°F | Ht 65.0 in | Wt >= 6400 oz

## 2018-09-27 DIAGNOSIS — Z6841 Body Mass Index (BMI) 40.0 and over, adult: Secondary | ICD-10-CM

## 2018-09-27 DIAGNOSIS — E559 Vitamin D deficiency, unspecified: Secondary | ICD-10-CM

## 2018-09-27 DIAGNOSIS — R7303 Prediabetes: Secondary | ICD-10-CM

## 2018-09-28 NOTE — Progress Notes (Signed)
Office: [phone]  /  Fax: [phone]   HPI:   Chief Complaint: OBESITY Ann Woods is here to discuss her progress with her obesity treatment plan. She is on the Pescatarian eating plan with vegetarian options and is following her eating plan approximately 100 % of the time. She states she is walking 20 minutes 7 times per week. Ann Woods states that she has been following the plan 100%. She has been making her own hamburgers. She has increased her carbohydrates. Her weight is (!) 403 lb (182.8 kg) today and has had a weight gain of 4 pounds over a period of 2 weeks since her last visit. She has lost 0 lbs since starting treatment with [place].  Pre-Diabetes Ann Woods has a diagnosis of prediabetes based on her elevated Hgb A1c and was informed this puts her at greater risk of developing diabetes. Her last A1c was at 5.7 and last insulin level was at 10.4 She is not taking medications currently and continues to work on diet and exercise to decrease risk of diabetes. She denies nausea or hypoglycemia. Ann Woods denies polyphagia.  Vitamin D deficiency Ann Woods has a diagnosis of vitamin D deficiency. She is currently taking vit D and denies nausea, vomiting or muscle weakness.  ASSESSMENT AND PLAN:  Prediabetes  Vitamin D deficiency  Class 3 severe obesity with serious comorbidity and body mass index (BMI) of 60.0 to 69.9 in adult, unspecified obesity type ([place])  PLAN:  Pre-Diabetes Ann Woods will continue to work on weight loss, exercise, increasing lean protein and decreasing simple carbohydrates in her diet to help decrease the risk of diabetes. She was informed that eating too many simple carbohydrates or too many calories at one sitting increases the likelihood of GI side effects. [name] agreed to follow up with [place] as directed to monitor her progress.  Vitamin D Deficiency Ann Woods was informed that low vitamin D levels contributes to fatigue and are associated with obesity, breast, and  colon cancer. She agrees to continue to take prescription Vit D @50 ,000 IU twice weekly and will follow up for routine testing of vitamin D, at least 2-3 times per year. She was informed of the risk of over-replacement of vitamin D and agrees to not increase her dose unless she discusses this with [place] first.  I spent > than 50% of the 15 minute visit on counseling as documented in the note.  Obesity Ann Woods is currently in the action stage of change. As such, her goal is to continue with weight loss efforts She has agreed to follow the [place] eating plan Ann Woods has been instructed to work up to a goal of 150 minutes of combined cardio and strengthening exercise per week for weight loss and overall health benefits. We discussed the following Behavioral Modification Strategies today: increase H2O intake, keeping healthy foods in the home, increasing lean protein intake ([employer] milk), protein content of foods (decrease deviation from the plan), decreasing simple carbohydrates, increasing vegetables and work on meal planning and easy cooking plans  Ann Woods has agreed to follow up with our clinic in 2 weeks. She was informed of the importance of frequent follow up visits to maximize her success with intensive lifestyle modifications for her multiple health conditions.  ALLERGIES: No Known Allergies  MEDICATIONS: Current Outpatient Medications on File Prior to Visit  Medication Sig Dispense Refill  . albuterol (PROVENTIL HFA;VENTOLIN HFA) 108 (90 Base) MCG/ACT inhaler Inhale 2 puffs into the lungs every 6 (six) hours as needed for wheezing or shortness of breath.  1 Inhaler 0  . mometasone-formoterol (DULERA) 100-5 MCG/ACT AERO Inhale 2 puffs into the lungs 2 (two) times daily. 1 Inhaler 1  . [name] (CYANOCOBALAMIN) 500 MCG tablet Take 500 mcg by mouth daily.    . Vitamin D, Ergocalciferol, (DRISDOL) 1.25 MG (50000 UT) CAPS capsule Take 1 capsule (50,000 Units total) by mouth 2 (two)  times a week. 8 capsule 0  . zinc gluconate 50 MG tablet Take 50 mg by mouth daily.    . ferrous sulfate 325 (65 FE) MG tablet Take 1 tablet (325 mg total) by mouth daily with breakfast. 30 tablet 0   [name].     PAST MEDICAL HISTORY: Past Medical History:  Diagnosis Date  . Anemia   . Asthma   . Hepatitis B   . High cholesterol   . Hypertension    G2 - preeclampsia, CHTN  . Infertility, female   . Lactose intolerance   . Obesity   . Obesity   . Prediabetes 04/01/2018  . Seasonal allergies   . Shortness of breath   . Swelling of both lower extremities     PAST SURGICAL HISTORY: Past Surgical History:  Procedure Laterality Date  . CESAREAN SECTION    . HIP SURGERY    . TUBAL LIGATION Bilateral 05/05/2014   Procedure: POST PARTUM TUBAL LIGATION;  Surgeon: Donnamae Jude, MD;  Location: [facility];  Service: Gynecology;  Laterality: Bilateral;    SOCIAL HISTORY: Social History   Tobacco Use  . Smoking status: Former Smoker    Years: 0.50    Types: Cigarettes    Last attempt to quit: 08/25/1993    Years since quitting: 25.1  . Smokeless tobacco: Never Used  . Tobacco comment: one pack of cigarettes weekly- 05/27/18  Substance Use Topics  . Alcohol use: Yes    Comment: occ  . Drug use: No    FAMILY HISTORY: Family History  Problem Relation Age of Onset  . Heart disease Mother   . Thyroid disease Mother   . High blood pressure Mother   . Obesity Mother   . COPD Father     ROS: Review of Systems  Constitutional: Negative for weight loss.  Gastrointestinal: Negative for nausea and vomiting.  Musculoskeletal:       Negative for muscle weakness  Endo/Heme/Allergies:       Negative for polyphagia    PHYSICAL EXAM: Blood pressure (!) 156/82, pulse 87, temperature 98.3 F (36.8 C), temperature source Oral, height 5\' 5"  (1.651 m), weight (!) 403 lb (182.8 kg), SpO2 95 %. Body mass index is 67.06  kg/m. Physical Exam Vitals signs reviewed.  Constitutional:      Appearance: Normal appearance. She is well-developed. She is obese.  Cardiovascular:     Rate and Rhythm: Normal rate.  Pulmonary:     Effort: Pulmonary effort is normal.  Musculoskeletal: Normal range of motion.  Skin:    General: Skin is warm and dry.  Neurological:     Mental Status: She is alert and oriented to person, place, and time.  Psychiatric:        Mood and Affect: Mood normal.        Behavior: Behavior normal.     RECENT LABS AND TESTS: BMET    Component Value Date/Time   NA 142 08/31/2018 1256   K 4.6 08/31/2018 1256   CL 99 08/31/2018 1256   CO2 26 08/31/2018 1256   GLUCOSE 86  08/31/2018 1256   GLUCOSE 116 (H) 02/11/2018 0314   BUN 4 (L) 08/31/2018 1256   CREATININE 0.75 08/31/2018 1256   CREATININE 0.61 03/20/2014 1652   CALCIUM 9.5 08/31/2018 1256   GFRNONAA 99 08/31/2018 1256   GFRAA 114 08/31/2018 1256   Lab Results  Component Value Date   HGBA1C 5.7 (H) 08/31/2018   HGBA1C 5.7 (H) 03/02/2018   Lab Results  Component Value Date   INSULIN 10.4 08/31/2018   CBC    Component Value Date/Time   WBC 3.6 (L) 05/27/2018 1144   RBC 5.14 (H) 05/27/2018 1144   HGB 12.1 05/27/2018 1144   HGB 11.3 05/05/2018 1009   HGB 10.4 11/21/2013   HCT 39.1 05/27/2018 1144   HCT 38.3 05/05/2018 1009   HCT 33 11/21/2013   PLT 326.0 05/27/2018 1144   PLT 312 05/05/2018 1009   PLT 306 11/21/2013   MCV 76.0 (L) 05/27/2018 1144   MCV 76 (L) 05/05/2018 1009   MCH 22.5 (L) 05/05/2018 1009   MCH 18.8 (L) 02/11/2018 0314   MCHC 31.0 05/27/2018 1144   RDW 17.4 (H) 05/27/2018 1144   RDW 18.1 (H) 05/05/2018 1009   LYMPHSABS 1.8 05/27/2018 1144   LYMPHSABS 1.4 05/05/2018 1009   MONOABS 0.5 05/27/2018 1144   EOSABS 0.1 05/27/2018 1144   EOSABS 0.1 05/05/2018 1009   BASOSABS 0.0 05/27/2018 1144   BASOSABS 0.0 05/05/2018 1009   Iron/TIBC/Ferritin/ %Sat    Component Value Date/Time   IRON 17 (L)  02/09/2018 0110   TIBC 465 (H) 02/09/2018 0110   FERRITIN 22 02/09/2018 0110   IRONPCTSAT 4 (L) 02/09/2018 0110   Lipid Panel     Component Value Date/Time   CHOL 160 03/02/2018 0954   TRIG 75 03/02/2018 0954   HDL 48 03/02/2018 0954   CHOLHDL 3.3 03/02/2018 0954   CHOLHDL 3.8 Ratio 05/07/2009 2031   VLDL 14 05/07/2009 2031   LDLCALC 97 03/02/2018 0954   Hepatic Function Panel     Component Value Date/Time   PROT 7.2 08/31/2018 1256   ALBUMIN 4.1 08/31/2018 1256   AST 18 08/31/2018 1256   ALT 16 08/31/2018 1256   ALT 15 06/17/2018 0939   ALKPHOS 96 08/31/2018 1256   BILITOT 0.9 08/31/2018 1256      Component Value Date/Time   TSH 3.140 03/02/2018 0954   TSH 1.950 02/17/2018 1631   TSH 1.511 05/07/2009 2031     Ref. Range 08/31/2018 12:56  Vitamin D, 25-Hydroxy Latest Ref Range: 30.0 - 100.0 ng/mL <4.0 (L)     OBESITY BEHAVIORAL INTERVENTION VISIT  Today's visit was # 3   Starting weight: 403 lbs Starting date: 08/31/2018 Today's weight : 403 lbs Today's date: 09/27/2018 Total lbs lost to date: 0   ASK: We discussed the diagnosis of obesity with Ann Woods today and Ann Woods agreed to give [place] permission to discuss obesity behavioral modification therapy today.  ASSESS: Ann Woods has the diagnosis of obesity and her BMI today is 67.06 Ann Woods is in the action stage of change   ADVISE: Ann Woods was educated on the multiple health risks of obesity as well as the benefit of weight loss to improve her health. She was advised of the need for long term treatment and the importance of lifestyle modifications to improve her current health and to decrease her risk of future health problems.  AGREE: Multiple dietary modification options and treatment options were discussed and  Ann Woods agreed to follow the recommendations documented in the  above note.  ARRANGE: Ann Woods was educated on the importance of frequent visits to treat obesity as outlined per CMS and USPSTF  guidelines and agreed to schedule her next follow up appointment today.  Corey Skains, am acting as Location manager for [employer]. Owens Shark, DO  I have reviewed the above documentation for accuracy and completeness, and I agree with the above. -Jearld Lesch, DO

## 2018-10-08 ENCOUNTER — Other Ambulatory Visit (INDEPENDENT_AMBULATORY_CARE_PROVIDER_SITE_OTHER): Payer: Self-pay | Admitting: Bariatrics

## 2018-10-08 DIAGNOSIS — E559 Vitamin D deficiency, unspecified: Secondary | ICD-10-CM

## 2018-10-11 ENCOUNTER — Other Ambulatory Visit: Payer: Self-pay | Admitting: Obstetrics and Gynecology

## 2018-10-11 ENCOUNTER — Encounter (INDEPENDENT_AMBULATORY_CARE_PROVIDER_SITE_OTHER): Payer: Self-pay | Admitting: Family Medicine

## 2018-10-11 ENCOUNTER — Ambulatory Visit (INDEPENDENT_AMBULATORY_CARE_PROVIDER_SITE_OTHER): Payer: Managed Care, Other (non HMO) | Admitting: Family Medicine

## 2018-10-11 ENCOUNTER — Ambulatory Visit
Admission: RE | Admit: 2018-10-11 | Discharge: 2018-10-11 | Disposition: A | Discharge status: Home / Self Care | Payer: Managed Care, Other (non HMO) | Source: Ambulatory Visit | Attending: Obstetrics and Gynecology | Admitting: Obstetrics and Gynecology

## 2018-10-11 VITALS — BP 148/88 | HR 78 | Temp 98.2°F | Ht 65.0 in | Wt >= 6400 oz

## 2018-10-11 DIAGNOSIS — I10 Essential (primary) hypertension: Secondary | ICD-10-CM | POA: Diagnosis not present

## 2018-10-11 DIAGNOSIS — Z9189 Other specified personal risk factors, not elsewhere classified: Secondary | ICD-10-CM | POA: Diagnosis not present

## 2018-10-11 DIAGNOSIS — N6310 Unspecified lump in the right breast, unspecified quadrant: Secondary | ICD-10-CM

## 2018-10-11 DIAGNOSIS — F3289 Other specified depressive episodes: Secondary | ICD-10-CM

## 2018-10-11 DIAGNOSIS — Z6841 Body Mass Index (BMI) 40.0 and over, adult: Secondary | ICD-10-CM

## 2018-10-11 MED ORDER — AMLODIPINE BESYLATE 5 MG PO TABS: 5.0000 mg | ORAL_TABLET | Freq: Every day | ORAL | 0 refills | Status: DC

## 2018-10-12 ENCOUNTER — Other Ambulatory Visit (INDEPENDENT_AMBULATORY_CARE_PROVIDER_SITE_OTHER): Payer: Self-pay | Admitting: Bariatrics

## 2018-10-12 DIAGNOSIS — E559 Vitamin D deficiency, unspecified: Secondary | ICD-10-CM

## 2018-10-12 NOTE — Progress Notes (Signed)
Office: [phone]  /  Fax: [phone]   HPI:   Chief Complaint: OBESITY Ann Woods is here to discuss her progress with her obesity treatment plan. She is on the Pescatarian eating plan and is following her eating plan approximately 50 % of the time. She states she is walking for 20 minutes 3 times per week. Ann Woods reports she does not pack enough food when working. She admits to some stress eating. She denies polyphagia. She does not eat eggs for philosophical reasons and the egg substitute that she uses is expensive.  Her weight is (!) 404 lb (183.3 kg) today and has gained 1 pound since her last visit. She has lost 0 lbs since starting treatment with [place].  Hypertension Ann Woods is a 43 y.o. female with a new diagnosis of hypertension. Ann Woods denies chest pain or shortness of breath. She is not on medications and blood pressures have been consistently elevated at visits to our clinic. She has been on hydrochlorothiazide in the past, but did not like the extra trips to the bathroom to urinate. She is working on weight loss to help control her blood pressure with the goal of decreasing her risk of heart attack and stroke. Ann Woods's blood pressure is not currently controlled.  At risk for cardiovascular disease Ann Woods is at a higher than average risk for cardiovascular disease due to obesity and hypertension. She currently denies any chest pain.  Depression with emotional eating behaviors Ann Woods reports eating when she is stressed. Ann Woods struggles with emotional eating and using food for comfort to the extent that it is negatively impacting her health. She often snacks when she is not hungry. Ann Woods sometimes feels she is out of control and then feels guilty that she made poor food choices. She has been working on behavior modification techniques to help reduce her emotional eating and has been somewhat successful. She shows no sign of suicidal or homicidal  ideations.  Depression screen [facility] 2/9 08/31/2018 07/15/2018 02/17/2018 05/04/2014 04/27/2014  Decreased Interest 1 0 0 0 0  Down, Depressed, Hopeless 1 0 0 0 0  PHQ - 2 Score 2 0 0 0 0  Altered sleeping 1 - - - -  Tired, decreased energy 1 - - - -  Change in appetite 0 - - - -  Feeling bad or failure about yourself  0 - - - -  Trouble concentrating 0 - - - -  Moving slowly or fidgety/restless 0 - - - -  Suicidal thoughts 0 - - - -  PHQ-9 Score 4 - - - -  Difficult doing work/chores Somewhat difficult - - - -    ASSESSMENT AND PLAN:  Essential hypertension - Plan: amLODipine (NORVASC) 5 MG tablet  Other depression - with emotional eating  At risk for heart disease  Class 3 severe obesity with serious comorbidity and body mass index (BMI) of 60.0 to 69.9 in adult, unspecified obesity type (HCC)  PLAN:  Hypertension We discussed sodium restriction, working on healthy weight loss, and a regular exercise program as the means to achieve improved blood pressure control. Ann Woods agreed with this plan and agreed to follow up as directed. We will continue to monitor her blood pressure as well as her progress with the above lifestyle modifications. Ann Woods agrees to start Norvasc 5 mg daily#30 with no refills. She will watch for signs of hypotension as she continues her lifestyle modifications. Ann Woods agrees to follow up with our clinic in 2 weeks.  Cardiovascular risk counseling  Ann Woods was given extended (15 minutes) coronary artery disease prevention counseling today. She is 43 y.o. female and has risk factors for heart disease including obesity and hypertension. We discussed intensive lifestyle modifications today with an emphasis on specific weight loss instructions and strategies. Pt was also informed of the importance of increasing exercise and decreasing saturated fats to help prevent heart disease.  Depression with Emotional Eating Behaviors We discussed behavior modification techniques  today to help Ann Woods deal with her emotional eating and depression. I may add Wellbutrin later. She will consider seeing Dr. Mallie Mussel, our bariatric psychologist, but she wants to check on her co-pay. Ann Woods agrees to follow up with our clinic in 2 weeks.  Obesity Ann Woods is currently in the action stage of change. As such, her goal is to continue with weight loss efforts She has agreed to follow the Pescatarian eating plan Ann Woods will continue current exercise regimen for weight loss and overall health benefits. We discussed the following Behavioral Modification Strategies today: work on meal planning and easy cooking plans, keeping healthy foods in the home, and planning for success I gave Ann Woods breakfast options. Handout was given: Emotional vs. Physical hunger.  Ann Woods has agreed to follow up with our clinic in 2 weeks. She was informed of the importance of frequent follow up visits to maximize her success with intensive lifestyle modifications for her multiple health conditions.  ALLERGIES: No Known Allergies  MEDICATIONS: Current Outpatient Medications on File Prior to Visit  Medication Sig Dispense Refill  . albuterol (PROVENTIL HFA;VENTOLIN HFA) 108 (90 Base) MCG/ACT inhaler Inhale 2 puffs into the lungs every 6 (six) hours as needed for wheezing or shortness of breath. 1 Inhaler 0  . mometasone-formoterol (DULERA) 100-5 MCG/ACT AERO Inhale 2 puffs into the lungs 2 (two) times daily. 1 Inhaler 1  . [name] (CYANOCOBALAMIN) 500 MCG tablet Take 500 mcg by mouth daily.    . Vitamin D, Ergocalciferol, (DRISDOL) 1.25 MG (50000 UT) CAPS capsule Take 1 capsule (50,000 Units total) by mouth 2 (two) times a week. 8 capsule 0  . zinc gluconate 50 MG tablet Take 50 mg by mouth daily.    . ferrous sulfate 325 (65 FE) MG tablet Take 1 tablet (325 mg total) by mouth daily with breakfast. 30 tablet 0   [name].     PAST MEDICAL  HISTORY: Past Medical History:  Diagnosis Date  . Anemia   . Asthma   . Hepatitis B   . High cholesterol   . Hypertension    G2 - preeclampsia, CHTN  . Infertility, female   . Lactose intolerance   . Obesity   . Obesity   . Prediabetes 04/01/2018  . Seasonal allergies   . Shortness of breath   . Swelling of both lower extremities     PAST SURGICAL HISTORY: Past Surgical History:  Procedure Laterality Date  . CESAREAN SECTION    . HIP SURGERY    . TUBAL LIGATION Bilateral 05/05/2014   Procedure: POST PARTUM TUBAL LIGATION;  Surgeon: Donnamae Jude, MD;  Location: [facility];  Service: Gynecology;  Laterality: Bilateral;    SOCIAL HISTORY: Social History   Tobacco Use  . Smoking status: Former Smoker    Years: 0.50    Types: Cigarettes    Last attempt to quit: 08/25/1993    Years since quitting: 25.1  . Smokeless tobacco: Never Used  . Tobacco comment: one pack of cigarettes weekly- 05/27/18  Substance  Use Topics  . Alcohol use: Yes    Comment: occ  . Drug use: No    FAMILY HISTORY: Family History  Problem Relation Age of Onset  . Heart disease Mother   . Thyroid disease Mother   . High blood pressure Mother   . Obesity Mother   . COPD Father     ROS: Review of Systems  Constitutional: Negative for weight loss.  Respiratory: Negative for shortness of breath.   Cardiovascular: Negative for chest pain.  Endo/Heme/Allergies:       Negative polyphagia  Psychiatric/Behavioral: Positive for depression. Negative for suicidal ideas.    PHYSICAL EXAM: Blood pressure (!) 148/88, pulse 78, temperature 98.2 F (36.8 C), height 5\' 5"  (1.651 m), weight (!) 404 lb (183.3 kg), SpO2 98 %. Body mass index is 67.23 kg/m. Physical Exam Vitals signs reviewed.  Constitutional:      Appearance: Normal appearance. She is obese.  Cardiovascular:     Rate and Rhythm: Normal rate.     Pulses: Normal pulses.  Pulmonary:     Effort: Pulmonary effort is normal.     Breath sounds:  Normal breath sounds.  Musculoskeletal: Normal range of motion.  Skin:    General: Skin is warm and dry.  Neurological:     Mental Status: She is alert and oriented to person, place, and time.  Psychiatric:        Mood and Affect: Mood normal.        Behavior: Behavior normal.     RECENT LABS AND TESTS: BMET    Component Value Date/Time   NA 142 08/31/2018 1256   K 4.6 08/31/2018 1256   CL 99 08/31/2018 1256   CO2 26 08/31/2018 1256   GLUCOSE 86 08/31/2018 1256   GLUCOSE 116 (H) 02/11/2018 0314   BUN 4 (L) 08/31/2018 1256   CREATININE 0.75 08/31/2018 1256   CREATININE 0.61 03/20/2014 1652   CALCIUM 9.5 08/31/2018 1256   GFRNONAA 99 08/31/2018 1256   GFRAA 114 08/31/2018 1256   Lab Results  Component Value Date   HGBA1C 5.7 (H) 08/31/2018   HGBA1C 5.7 (H) 03/02/2018   Lab Results  Component Value Date   INSULIN 10.4 08/31/2018   CBC    Component Value Date/Time   WBC 3.6 (L) 05/27/2018 1144   RBC 5.14 (H) 05/27/2018 1144   HGB 12.1 05/27/2018 1144   HGB 11.3 05/05/2018 1009   HGB 10.4 11/21/2013   HCT 39.1 05/27/2018 1144   HCT 38.3 05/05/2018 1009   HCT 33 11/21/2013   PLT 326.0 05/27/2018 1144   PLT 312 05/05/2018 1009   PLT 306 11/21/2013   MCV 76.0 (L) 05/27/2018 1144   MCV 76 (L) 05/05/2018 1009   MCH 22.5 (L) 05/05/2018 1009   MCH 18.8 (L) 02/11/2018 0314   MCHC 31.0 05/27/2018 1144   RDW 17.4 (H) 05/27/2018 1144   RDW 18.1 (H) 05/05/2018 1009   LYMPHSABS 1.8 05/27/2018 1144   LYMPHSABS 1.4 05/05/2018 1009   MONOABS 0.5 05/27/2018 1144   EOSABS 0.1 05/27/2018 1144   EOSABS 0.1 05/05/2018 1009   BASOSABS 0.0 05/27/2018 1144   BASOSABS 0.0 05/05/2018 1009   Iron/TIBC/Ferritin/ %Sat    Component Value Date/Time   IRON 17 (L) 02/09/2018 0110   TIBC 465 (H) 02/09/2018 0110   FERRITIN 22 02/09/2018 0110   IRONPCTSAT 4 (L) 02/09/2018 0110   Lipid Panel     Component Value Date/Time   CHOL 160 03/02/2018 0954   TRIG  75 03/02/2018 0954   HDL  48 03/02/2018 0954   CHOLHDL 3.3 03/02/2018 0954   CHOLHDL 3.8 Ratio 05/07/2009 2031   VLDL 14 05/07/2009 2031   LDLCALC 97 03/02/2018 0954   Hepatic Function Panel     Component Value Date/Time   PROT 7.2 08/31/2018 1256   ALBUMIN 4.1 08/31/2018 1256   AST 18 08/31/2018 1256   ALT 16 08/31/2018 1256   ALT 15 06/17/2018 0939   ALKPHOS 96 08/31/2018 1256   BILITOT 0.9 08/31/2018 1256      Component Value Date/Time   TSH 3.140 03/02/2018 0954   TSH 1.950 02/17/2018 1631   TSH 1.511 05/07/2009 2031      OBESITY BEHAVIORAL INTERVENTION VISIT  Today's visit was # 4   Starting weight: 403 lbs Starting date: 08/31/2018  Today's weight : 404 lbs  Today's date: 10/11/2018 Total lbs lost to date: 0    ASK: We discussed the diagnosis of obesity with Ann Woods today and Ann Woods agreed to give [place] permission to discuss obesity behavioral modification therapy today.  ASSESS: Ann Woods has the diagnosis of obesity and her BMI today is 67.23 Ann Woods is in the action stage of change   ADVISE: Ann Woods was educated on the multiple health risks of obesity as well as the benefit of weight loss to improve her health. She was advised of the need for long term treatment and the importance of lifestyle modifications to improve her current health and to decrease her risk of future health problems.  AGREE: Multiple dietary modification options and treatment options were discussed and  Ann Woods agreed to follow the recommendations documented in the above note.  ARRANGE: Ann Woods was educated on the importance of frequent visits to treat obesity as outlined per CMS and USPSTF guidelines and agreed to schedule her next follow up appointment today.  Ann Woods, am acting as Location manager for [employer], FNP-C.  I have reviewed the above documentation for accuracy and completeness, and I agree with the above.  - Kalianna Verbeke, FNP-C.

## 2018-10-13 ENCOUNTER — Encounter (INDEPENDENT_AMBULATORY_CARE_PROVIDER_SITE_OTHER): Payer: Self-pay | Admitting: Family Medicine

## 2018-10-13 DIAGNOSIS — F329 Major depressive disorder, single episode, unspecified: Secondary | ICD-10-CM | POA: Insufficient documentation

## 2018-10-13 DIAGNOSIS — I10 Essential (primary) hypertension: Secondary | ICD-10-CM | POA: Insufficient documentation

## 2018-10-13 DIAGNOSIS — F32A Depression, unspecified: Secondary | ICD-10-CM | POA: Insufficient documentation

## 2018-10-25 ENCOUNTER — Ambulatory Visit (INDEPENDENT_AMBULATORY_CARE_PROVIDER_SITE_OTHER): Payer: Managed Care, Other (non HMO) | Admitting: Family Medicine

## 2018-10-25 ENCOUNTER — Encounter (INDEPENDENT_AMBULATORY_CARE_PROVIDER_SITE_OTHER): Payer: Self-pay | Admitting: Family Medicine

## 2018-10-25 VITALS — BP 139/80 | HR 82 | Temp 97.4°F | Ht 65.0 in | Wt 393.0 lb

## 2018-10-25 DIAGNOSIS — Z9189 Other specified personal risk factors, not elsewhere classified: Secondary | ICD-10-CM

## 2018-10-25 DIAGNOSIS — I10 Essential (primary) hypertension: Secondary | ICD-10-CM

## 2018-10-25 DIAGNOSIS — E559 Vitamin D deficiency, unspecified: Secondary | ICD-10-CM | POA: Insufficient documentation

## 2018-10-25 DIAGNOSIS — Z6841 Body Mass Index (BMI) 40.0 and over, adult: Secondary | ICD-10-CM

## 2018-10-25 MED ORDER — VITAMIN D (ERGOCALCIFEROL) 1.25 MG (50000 UNIT) PO CAPS: 50000.0000 [IU] | ORAL_CAPSULE | ORAL | 0 refills | Status: DC

## 2018-10-25 MED ORDER — AMLODIPINE BESYLATE 5 MG PO TABS: 5.0000 mg | ORAL_TABLET | Freq: Every day | ORAL | 0 refills | Status: DC

## 2018-10-25 NOTE — Progress Notes (Signed)
Office: [phone]  /  Fax: [phone]   HPI:   Chief Complaint: OBESITY Ann Woods is here to discuss her progress with her obesity treatment plan. She is on the [place] or Vegetarian plan and is following her eating plan approximately 80% of the time. She states she is walking 20 minutes 3 times per week. Ann Woods is eating all the food on the plan and not skipping meals. She denies polyphagia. She states that she would like some options for sweets.Her weight is (!) 393 lb (178.3 kg) today and has had a weight loss of 11 pounds over a period of 2 weeks since her last visit. She has lost 10 lbs since starting treatment with [place].  Hypertension Ann Woods is a 43 y.o. female with hypertension.  Ann Woods's blood pressure is currently controlled on Norvasc 5 mg which was started at her last visit. She is tolerating the Norvasc well.   Ann Woods denies chest pain or shortness of breath on exertion. She is working weight loss to help control her blood pressure with the goal of decreasing her risk of heart attack and stroke.  At risk for cardiovascular disease Ann Woods is at a higher than average risk for cardiovascular disease due to obesity. She currently denies any chest pain.  Vitamin D deficiency Ann Woods has a diagnosis of Vitamin D deficiency which is not at goal. Her last Vitamin D level was reported at less than 4 on 08/31/2018. She is currently taking prescription Vit D and denies nausea, vomiting or muscle weakness but does report positive fatigue.  ASSESSMENT AND PLAN:  Essential hypertension - Plan: amLODipine (NORVASC) 5 MG tablet  Vitamin D deficiency - Plan: Vitamin D, Ergocalciferol, (DRISDOL) 1.25 MG (50000 UT) CAPS capsule  At risk for heart disease  Class 3 severe obesity with serious comorbidity and body mass index (BMI) of 60.0 to 69.9 in adult, unspecified obesity type ([place])  PLAN:  Hypertension We discussed sodium restriction, working on healthy  weight loss, and a regular exercise program as the means to achieve improved blood pressure control. [name] agreed with this plan and agreed to follow up as directed. We will continue to monitor her blood pressure as well as her progress with the above lifestyle modifications. She was given a refill on her amlodipine 5 mg qd #30 with 0 refills and will watch for signs of hypotension as she continues her lifestyle modifications.  Cardiovascular risk counseling Ann Woods was given extended (15 minutes) coronary artery disease prevention counseling today. She is 43 y.o. female and has risk factors for heart disease including obesity. We discussed intensive lifestyle modifications today with an emphasis on specific weight loss instructions and strategies. Pt was also informed of the importance of increasing exercise and decreasing saturated fats to help prevent heart disease.  Vitamin D Deficiency Ann Woods was informed that low Vitamin D levels contributes to fatigue and are associated with obesity, breast, and colon cancer. She agrees to continue to take prescription Vit D @ 50,000 IU twice a week #10 with 0 refills and will follow-up for routine testing of Vitamin D, at least 2-3 times per year. She was informed of the risk of over-replacement of Vitamin D and agrees to not increase her dose unless she discusses this with [place] first. Ann Woods agrees to follow-up with our clinic in 4 weeks.  Obesity Ann Woods is currently in the action stage of change. As such, her goal is to continue with weight loss efforts She has agreed to follow our  Pescatarian or Vegetarian plan. She was advised that she may have 300 snack calories with the Vegetarian or Pescatarian plan since RMR is 2724 . A handout on 100 calorie snacks was given.  Ann Woods has been instructed to continue her exercise regimen as noted above. We discussed the following Behavioral Modification Strategies today: better snacking choices and planning for  success.  Ann Woods has agreed to follow-up with our clinic in 4 weeks and will see our RD in 2 weeks. She was informed of the importance of frequent follow up visits to maximize her success with intensive lifestyle modifications for her multiple health conditions.  ALLERGIES: No Known Allergies  MEDICATIONS: Current Outpatient Medications on File Prior to Visit  Medication Sig Dispense Refill  . albuterol (PROVENTIL HFA;VENTOLIN HFA) 108 (90 Base) MCG/ACT inhaler Inhale 2 puffs into the lungs every 6 (six) hours as needed for wheezing or shortness of breath. 1 Inhaler 0  . mometasone-formoterol (DULERA) 100-5 MCG/ACT AERO Inhale 2 puffs into the lungs 2 (two) times daily. 1 Inhaler 1  . [name] (CYANOCOBALAMIN) 500 MCG tablet Take 500 mcg by mouth daily.    [name] zinc gluconate 50 MG tablet Take 50 mg by mouth daily.    . ferrous sulfate 325 (65 FE) MG tablet Take 1 tablet (325 mg total) by mouth daily with breakfast. 30 tablet 0   [name].     PAST MEDICAL HISTORY: Past Medical History:  Diagnosis Date  . Anemia   . Asthma   . Hepatitis B   . High cholesterol   . Hypertension    G2 - preeclampsia, CHTN  . Infertility, female   . Lactose intolerance   . Obesity   . Obesity   . Prediabetes 04/01/2018  . Seasonal allergies   . Shortness of breath   . Swelling of both lower extremities     PAST SURGICAL HISTORY: Past Surgical History:  Procedure Laterality Date  . CESAREAN SECTION    . HIP SURGERY    . TUBAL LIGATION Bilateral 05/05/2014   Procedure: POST PARTUM TUBAL LIGATION;  Surgeon: Donnamae Jude, MD;  Location: [facility];  Service: Gynecology;  Laterality: Bilateral;    SOCIAL HISTORY: Social History   Tobacco Use  . Smoking status: Former Smoker    Years: 0.50    Types: Cigarettes    Last attempt to quit: 08/25/1993    Years since quitting: 25.1  . Smokeless tobacco: Never Used  . Tobacco comment: one  pack of cigarettes weekly- 05/27/18  Substance Use Topics  . Alcohol use: Yes    Comment: occ  . Drug use: No    FAMILY HISTORY: Family History  Problem Relation Age of Onset  . Heart disease Mother   . Thyroid disease Mother   . High blood pressure Mother   . Obesity Mother   . COPD Father    ROS: Review of Systems  Constitutional: Positive for malaise/fatigue and weight loss.  Respiratory: Negative for shortness of breath.   Cardiovascular: Negative for chest pain.  Gastrointestinal: Negative for nausea and vomiting.  Musculoskeletal:       Negative for muscle weakness.  Endo/Heme/Allergies:       Negative for hypoglycemia.   PHYSICAL EXAM: Blood pressure 139/80, pulse 82, temperature (!) 97.4 F (36.3 C), temperature source Oral, height 5\' 5"  (1.651 m), weight (!) 393 lb (178.3 kg), SpO2 97 %. Body mass index is 65.4 kg/m. Physical Exam Vitals signs reviewed.  Constitutional:      Appearance: Normal appearance. She is obese.  Cardiovascular:     Rate and Rhythm: Normal rate.     Pulses: Normal pulses.  Pulmonary:     Effort: Pulmonary effort is normal.     Breath sounds: Normal breath sounds.  Musculoskeletal: Normal range of motion.  Skin:    General: Skin is warm and dry.  Neurological:     Mental Status: She is alert and oriented to person, place, and time.  Psychiatric:        Behavior: Behavior normal.   RECENT LABS AND TESTS: BMET    Component Value Date/Time   NA 142 08/31/2018 1256   K 4.6 08/31/2018 1256   CL 99 08/31/2018 1256   CO2 26 08/31/2018 1256   GLUCOSE 86 08/31/2018 1256   GLUCOSE 116 (H) 02/11/2018 0314   BUN 4 (L) 08/31/2018 1256   CREATININE 0.75 08/31/2018 1256   CREATININE 0.61 03/20/2014 1652   CALCIUM 9.5 08/31/2018 1256   GFRNONAA 99 08/31/2018 1256   GFRAA 114 08/31/2018 1256   Lab Results  Component Value Date   HGBA1C 5.7 (H) 08/31/2018   HGBA1C 5.7 (H) 03/02/2018   Lab Results  Component Value Date   INSULIN  10.4 08/31/2018   CBC    Component Value Date/Time   WBC 3.6 (L) 05/27/2018 1144   RBC 5.14 (H) 05/27/2018 1144   HGB 12.1 05/27/2018 1144   HGB 11.3 05/05/2018 1009   HGB 10.4 11/21/2013   HCT 39.1 05/27/2018 1144   HCT 38.3 05/05/2018 1009   HCT 33 11/21/2013   PLT 326.0 05/27/2018 1144   PLT 312 05/05/2018 1009   PLT 306 11/21/2013   MCV 76.0 (L) 05/27/2018 1144   MCV 76 (L) 05/05/2018 1009   MCH 22.5 (L) 05/05/2018 1009   MCH 18.8 (L) 02/11/2018 0314   MCHC 31.0 05/27/2018 1144   RDW 17.4 (H) 05/27/2018 1144   RDW 18.1 (H) 05/05/2018 1009   LYMPHSABS 1.8 05/27/2018 1144   LYMPHSABS 1.4 05/05/2018 1009   MONOABS 0.5 05/27/2018 1144   EOSABS 0.1 05/27/2018 1144   EOSABS 0.1 05/05/2018 1009   BASOSABS 0.0 05/27/2018 1144   BASOSABS 0.0 05/05/2018 1009   Iron/TIBC/Ferritin/ %Sat    Component Value Date/Time   IRON 17 (L) 02/09/2018 0110   TIBC 465 (H) 02/09/2018 0110   FERRITIN 22 02/09/2018 0110   IRONPCTSAT 4 (L) 02/09/2018 0110   Lipid Panel     Component Value Date/Time   CHOL 160 03/02/2018 0954   TRIG 75 03/02/2018 0954   HDL 48 03/02/2018 0954   CHOLHDL 3.3 03/02/2018 0954   CHOLHDL 3.8 Ratio 05/07/2009 2031   VLDL 14 05/07/2009 2031   LDLCALC 97 03/02/2018 0954   Hepatic Function Panel     Component Value Date/Time   PROT 7.2 08/31/2018 1256   ALBUMIN 4.1 08/31/2018 1256   AST 18 08/31/2018 1256   ALT 16 08/31/2018 1256   ALT 15 06/17/2018 0939   ALKPHOS 96 08/31/2018 1256   BILITOT 0.9 08/31/2018 1256      Component Value Date/Time   TSH 3.140 03/02/2018 0954   TSH 1.950 02/17/2018 1631   TSH 1.511 05/07/2009 2031    Ref. Range 08/31/2018 12:56  Vitamin D, 25-Hydroxy Latest Ref Range: 30.0 - 100.0 ng/mL <4.0 (L)   OBESITY BEHAVIORAL INTERVENTION VISIT  Today's visit was #5  Starting weight: 403 lbs Starting date: 08/31/2018 Today's [id number] lbs Today's date: 10/25/2018 Total lbs  lost to date: 10    10/25/2018  Height 5\' 5"  (1.651 m)   Weight 393 lb (178.3 kg) (A)  BMI (Calculated) 65.4  BLOOD PRESSURE - SYSTOLIC 361  BLOOD PRESSURE - DIASTOLIC 80   Body Fat % 44.3 %   ASK: We discussed the diagnosis of obesity with [name] today and Leinaala agreed to give [place] permission to discuss obesity behavioral modification therapy today.  ASSESS: Shaquila has the diagnosis of obesity and her BMI today is 65.4. Evangelene is in the action stage of change.  ADVISE: Yoanna was educated on the multiple health risks of obesity as well as the benefit of weight loss to improve her health. She was advised of the need for long term treatment and the importance of lifestyle modifications to improve her current health and to decrease her risk of future health problems.  AGREE: Multiple dietary modification options and treatment options were discussed and  Luzelena agreed to follow the recommendations documented in the above note.  ARRANGE: Marika was educated on the importance of frequent visits to treat obesity as outlined per CMS and USPSTF guidelines and agreed to schedule her next follow up appointment today.  IMichaelene Song, am acting as Location manager for [employer], FNP-C.  I have reviewed the above documentation for accuracy and completeness, and I agree with the above.  - Dondra Rhett, FNP-C.

## 2018-11-04 ENCOUNTER — Encounter (INDEPENDENT_AMBULATORY_CARE_PROVIDER_SITE_OTHER): Payer: Self-pay

## 2018-11-08 ENCOUNTER — Encounter (INDEPENDENT_AMBULATORY_CARE_PROVIDER_SITE_OTHER): Payer: Self-pay

## 2018-11-08 ENCOUNTER — Ambulatory Visit (INDEPENDENT_AMBULATORY_CARE_PROVIDER_SITE_OTHER): Payer: Managed Care, Other (non HMO) | Admitting: Dietician

## 2018-11-17 ENCOUNTER — Encounter (INDEPENDENT_AMBULATORY_CARE_PROVIDER_SITE_OTHER): Payer: Self-pay

## 2018-11-18 ENCOUNTER — Encounter (INDEPENDENT_AMBULATORY_CARE_PROVIDER_SITE_OTHER): Payer: Self-pay

## 2018-11-22 ENCOUNTER — Other Ambulatory Visit: Payer: Self-pay

## 2018-11-22 ENCOUNTER — Ambulatory Visit (INDEPENDENT_AMBULATORY_CARE_PROVIDER_SITE_OTHER): Payer: Managed Care, Other (non HMO) | Admitting: Family Medicine

## 2018-11-22 ENCOUNTER — Encounter (INDEPENDENT_AMBULATORY_CARE_PROVIDER_SITE_OTHER): Payer: Self-pay | Admitting: Family Medicine

## 2018-11-22 DIAGNOSIS — I10 Essential (primary) hypertension: Secondary | ICD-10-CM

## 2018-11-22 DIAGNOSIS — Z6841 Body Mass Index (BMI) 40.0 and over, adult: Secondary | ICD-10-CM

## 2018-11-22 DIAGNOSIS — R7303 Prediabetes: Secondary | ICD-10-CM | POA: Diagnosis not present

## 2018-11-22 MED ORDER — AMLODIPINE BESYLATE 5 MG PO TABS: 5.0000 mg | ORAL_TABLET | Freq: Every day | ORAL | 0 refills | Status: DC

## 2018-11-23 NOTE — Progress Notes (Signed)
Office: [phone]  /  Fax: [phone] TeleHealth Visit:  Ann Woods has consented to this TeleHealth visit today via telephone call. The patient is located at home, the provider is located at the [facility] office. The participants in this visit include the listed provider and patient.   HPI:   Chief Complaint: OBESITY Ann Woods is here to discuss her progress with her obesity treatment plan. She is on the Pescatarian eating plan or follow our protein rich vegetarian plan and is following her eating plan approximately 100 % of the time. She states she is walking for 20 minutes 3 times per week. Ann Woods has not weighed recently. She is finding all the food she needs for the plan.  We were unable to weight the patient today for this TeleHealth visit. She feels as if she has lost 5 lbs since her last visit. She has lost 10-15 lbs since starting treatment with [place].  Hypertension Ann Woods is a 43 y.o. female with hypertension. [name] states yesterday her blood pressure was 138/68. It has not been well controlled recently at office visits. She denies chest pain or shortness of breath. She is working on weight loss to help control her blood pressure with the goal of decreasing her risk of heart attack and stroke.  BP Readings from Last 3 Encounters:  10/25/18 139/80  10/11/18 (!) 148/88  09/27/18 (!) 156/82     Pre-Diabetes Ann Woods has a diagnosis of pre-diabetes based on her elevated Hgb A1c and was informed this puts her at greater risk of developing diabetes. She denies polyphagia and she is not on metformin. She continues to work on diet and exercise to decrease risk of diabetes. She denies nausea or hypoglycemia. Lab Results  Component Value Date   HGBA1C 5.7 (H) 08/31/2018    ASSESSMENT AND PLAN:  Essential hypertension - Plan: amLODipine (NORVASC) 5 MG tablet  Prediabetes  Class 3 severe obesity with serious comorbidity and body mass index (BMI)  of 60.0 to 69.9 in adult, unspecified obesity type ([place])  PLAN:  Hypertension We discussed sodium restriction, working on healthy weight loss, and a regular exercise program as the means to achieve improved blood pressure control. Ann Woods agreed with this plan and agreed to follow up as directed. We will continue to monitor her blood pressure as well as her progress with the above lifestyle modifications. Ann Woods agrees to continue taking Norvasc 5 mg qd #30 and we will refill for 1 month. She will watch for signs of hypotension as she continues her lifestyle modifications. Ann Woods agrees to follow up with our clinic in 4 weeks.  Pre-Diabetes Ann Woods will continue her meal plan, and will continue to work on weight loss, exercise, and decreasing simple carbohydrates in her diet to help decrease the risk of diabetes.  Ann Woods agrees to follow up with our clinic in 4 weeks as directed to monitor her progress.  Obesity Ann Woods is currently in the action stage of change. As such, her goal is to continue with weight loss efforts She has agreed to follow the Pescatarian eating plan or follow our protein rich vegetarian plan Ann Woods has been instructed to walk for 20 minutes 3 x per week. We discussed the following Behavioral Modification Strategies today: planning for success Ann Woods is to weigh herself before her next visit on the scale at work.  Ann Woods has agreed to follow up with our clinic in 4 weeks. She was informed of the importance of frequent follow up visits to  maximize her success with intensive lifestyle modifications for her multiple health conditions.  ALLERGIES: No Known Allergies  MEDICATIONS: Current Outpatient Medications on File Prior to Visit  Medication Sig Dispense Refill  . albuterol (PROVENTIL HFA;VENTOLIN HFA) 108 (90 Base) MCG/ACT inhaler Inhale 2 puffs into the lungs every 6 (six) hours as needed for wheezing or shortness of breath. 1 Inhaler 0  . ferrous sulfate 325  (65 FE) MG tablet Take 1 tablet (325 mg total) by mouth daily with breakfast. 30 tablet 0  . mometasone-formoterol (DULERA) 100-5 MCG/ACT AERO Inhale 2 puffs into the lungs 2 (two) times daily. 1 Inhaler 1  . [name] (CYANOCOBALAMIN) 500 MCG tablet Take 500 mcg by mouth daily.    . Vitamin D, Ergocalciferol, (DRISDOL) 1.25 MG (50000 UT) CAPS capsule Take 1 capsule (50,000 Units total) by mouth 2 (two) times a week. 10 capsule 0  . zinc gluconate 50 MG tablet Take 50 mg by mouth daily.     [name].     PAST MEDICAL HISTORY: Past Medical History:  Diagnosis Date  . Anemia   . Asthma   . Hepatitis B   . High cholesterol   . Hypertension    G2 - preeclampsia, CHTN  . Infertility, female   . Lactose intolerance   . Obesity   . Obesity   . Prediabetes 04/01/2018  . Seasonal allergies   . Shortness of breath   . Swelling of both lower extremities     PAST SURGICAL HISTORY: Past Surgical History:  Procedure Laterality Date  . CESAREAN SECTION    . HIP SURGERY    . TUBAL LIGATION Bilateral 05/05/2014   Procedure: POST PARTUM TUBAL LIGATION;  Surgeon: Ann Jude, MD;  Location: [facility];  Service: Gynecology;  Laterality: Bilateral;    SOCIAL HISTORY: Social History   Tobacco Use  . Smoking status: Former Smoker    Years: 0.50    Types: Cigarettes    Last attempt to quit: 08/25/1993    Years since quitting: 25.2  . Smokeless tobacco: Never Used  . Tobacco comment: one pack of cigarettes weekly- 05/27/18  Substance Use Topics  . Alcohol use: Yes    Comment: occ  . Drug use: No    FAMILY HISTORY: Family History  Problem Relation Age of Onset  . Heart disease Mother   . Thyroid disease Mother   . High blood pressure Mother   . Obesity Mother   . COPD Father     ROS: Review of Systems  Constitutional: Positive for weight loss.  Respiratory: Negative for shortness of breath.   Cardiovascular: Negative for  chest pain.  Gastrointestinal: Negative for nausea.  Endo/Heme/Allergies:       Negative polyphagia Negative hypoglycemia    PHYSICAL EXAM: Pt in no acute distress  RECENT LABS AND TESTS: BMET    Component Value Date/Time   NA 142 08/31/2018 1256   K 4.6 08/31/2018 1256   CL 99 08/31/2018 1256   CO2 26 08/31/2018 1256   GLUCOSE 86 08/31/2018 1256   GLUCOSE 116 (H) 02/11/2018 0314   BUN 4 (L) 08/31/2018 1256   CREATININE 0.75 08/31/2018 1256   CREATININE 0.61 03/20/2014 1652   CALCIUM 9.5 08/31/2018 1256   GFRNONAA 99 08/31/2018 1256   GFRAA 114 08/31/2018 1256   Lab Results  Component Value Date   HGBA1C 5.7 (H) 08/31/2018   HGBA1C 5.7 (H) 03/02/2018   Lab Results  Component  Value Date   INSULIN 10.4 08/31/2018   CBC    Component Value Date/Time   WBC 3.6 (L) 05/27/2018 1144   RBC 5.14 (H) 05/27/2018 1144   HGB 12.1 05/27/2018 1144   HGB 11.3 05/05/2018 1009   HGB 10.4 11/21/2013   HCT 39.1 05/27/2018 1144   HCT 38.3 05/05/2018 1009   HCT 33 11/21/2013   PLT 326.0 05/27/2018 1144   PLT 312 05/05/2018 1009   PLT 306 11/21/2013   MCV 76.0 (L) 05/27/2018 1144   MCV 76 (L) 05/05/2018 1009   MCH 22.5 (L) 05/05/2018 1009   MCH 18.8 (L) 02/11/2018 0314   MCHC 31.0 05/27/2018 1144   RDW 17.4 (H) 05/27/2018 1144   RDW 18.1 (H) 05/05/2018 1009   LYMPHSABS 1.8 05/27/2018 1144   LYMPHSABS 1.4 05/05/2018 1009   MONOABS 0.5 05/27/2018 1144   EOSABS 0.1 05/27/2018 1144   EOSABS 0.1 05/05/2018 1009   BASOSABS 0.0 05/27/2018 1144   BASOSABS 0.0 05/05/2018 1009   Iron/TIBC/Ferritin/ %Sat    Component Value Date/Time   IRON 17 (L) 02/09/2018 0110   TIBC 465 (H) 02/09/2018 0110   FERRITIN 22 02/09/2018 0110   IRONPCTSAT 4 (L) 02/09/2018 0110   Lipid Panel     Component Value Date/Time   CHOL 160 03/02/2018 0954   TRIG 75 03/02/2018 0954   HDL 48 03/02/2018 0954   CHOLHDL 3.3 03/02/2018 0954   CHOLHDL 3.8 Ratio 05/07/2009 2031   VLDL 14 05/07/2009 2031    LDLCALC 97 03/02/2018 0954   Hepatic Function Panel     Component Value Date/Time   PROT 7.2 08/31/2018 1256   ALBUMIN 4.1 08/31/2018 1256   AST 18 08/31/2018 1256   ALT 16 08/31/2018 1256   ALT 15 06/17/2018 0939   ALKPHOS 96 08/31/2018 1256   BILITOT 0.9 08/31/2018 1256      Component Value Date/Time   TSH 3.140 03/02/2018 0954   TSH 1.950 02/17/2018 1631   TSH 1.511 05/07/2009 2031      I, Trixie Dredge, am acting as transcriptionist for [employer], FNP-C. I have reviewed the above documentation for accuracy and completeness, and I agree with the above.  -  , FNP-C.

## 2018-11-24 ENCOUNTER — Encounter (INDEPENDENT_AMBULATORY_CARE_PROVIDER_SITE_OTHER): Payer: Self-pay | Admitting: Family Medicine

## 2018-12-01 ENCOUNTER — Encounter (INDEPENDENT_AMBULATORY_CARE_PROVIDER_SITE_OTHER): Payer: Self-pay | Admitting: Family Medicine

## 2018-12-02 NOTE — Telephone Encounter (Signed)
FYI

## 2018-12-20 ENCOUNTER — Encounter (INDEPENDENT_AMBULATORY_CARE_PROVIDER_SITE_OTHER): Payer: Self-pay | Admitting: Family Medicine

## 2018-12-20 ENCOUNTER — Ambulatory Visit (INDEPENDENT_AMBULATORY_CARE_PROVIDER_SITE_OTHER): Payer: Managed Care, Other (non HMO) | Admitting: Family Medicine

## 2018-12-20 ENCOUNTER — Other Ambulatory Visit: Payer: Self-pay

## 2018-12-20 DIAGNOSIS — I10 Essential (primary) hypertension: Secondary | ICD-10-CM

## 2018-12-20 DIAGNOSIS — E559 Vitamin D deficiency, unspecified: Secondary | ICD-10-CM

## 2018-12-20 DIAGNOSIS — Z6841 Body Mass Index (BMI) 40.0 and over, adult: Secondary | ICD-10-CM | POA: Diagnosis not present

## 2018-12-20 NOTE — Progress Notes (Signed)
Office: [phone]  /  Fax: [phone] TeleHealth Visit:  Ann Woods has verbally consented to this TeleHealth visit today. The patient is located at home, the provider is located at the [employer] [facility] office. The participants in this visit include the listed provider and patient. The visit was conducted today via telephone call (unable to use Webex or FaceTime.  HPI:   Chief Complaint: OBESITY Ann Woods is here to discuss her progress with her obesity treatment plan. She is on the [place] or protein rich vegetarian plan and is following her eating plan approximately 100% of the time. She states she is walking 20 minutes 3 times per week. 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 Work is more stressful now because all of the patients have to stay in their rooms. She denies polyphagia. She states she is sticking to the plan very well and feels she has lost 3 lbs. We were unable to weigh the patient today for this TeleHealth visit. She feels as if she has lost 3 lbs since her last visit. She has lost 10 lbs since starting treatment with [place].  Hypertension Ann Woods is a 43 y.o. female with hypertension. Ann Woods denies chest pain or shortness of breath on exertion. She is working weight loss to help control her blood pressure with the goal of decreasing her risk of heart attack and stroke. [name] reports she has not checked her blood pressure recently. She states it was 150/98 on 12/01/2018. She does report being compliant with her Norvasc.  Vitamin D deficiency Latiesha has a diagnosis of Vitamin D deficiency, which is not at goal. Her last Vitamin D level was reported to be <4.0 on 08/31/2018. She is currently taking prescription Vit D and denies nausea, vomiting or muscle weakness. She does report mild fatigue.  ASSESSMENT AND PLAN:  Essential hypertension  Vitamin D deficiency  Class 3 severe obesity with  serious comorbidity and body mass index (BMI) of 60.0 to 69.9 in adult, unspecified obesity type ([place])  PLAN:  Hypertension We discussed sodium restriction, working on healthy weight loss, and a regular exercise program as the means to achieve improved blood pressure control. Shanikaagreed with this plan and agreed to follow-up as directed. We will continue to monitor her blood pressure as well as her progress with the above lifestyle modifications. Ann Woods will check her blood pressure at work a few times per week and will continue her Norvasc 5 mg daily. She will continue her medications as prescribed and will watch for signs of hypotension as she continues her lifestyle modifications.  Vitamin D Deficiency Ann Woods was informed that low Vitamin D levels contributes to fatigue and are associated with obesity, breast, and colon cancer. She agrees to continue to take prescription Vit D @ 50,000 IU two times weekly (no RX needed) and will follow-up for routine testing of Vitamin D, at least 2-3 times per year. She was informed of the risk of over-replacement of Vitamin D and agrees to not increase her dose unless she discusses this with [place] first. Ann Woods agrees to follow-up with our clinic in 2 weeks.  Obesity Ann Woods is currently in the action stage of change. As such, her goal is to continue with weight loss efforts. She has agreed to follow our [place] or vegetarian plan. Ann Woods has been instructed to continue her current exercise regimen for weight loss and overall health benefits. We discussed the following Behavioral Modification Strategies today: planning for  success.  Ann Woods has agreed to follow-up with our clinic in 2 weeks. She was informed of the importance of frequent follow-up visits to maximize her success with intensive lifestyle modifications for her multiple health conditions.  ALLERGIES: No Known Allergies  MEDICATIONS: Current Outpatient Medications on File Prior to  Visit  Medication Sig Dispense Refill  . albuterol (PROVENTIL HFA;VENTOLIN HFA) 108 (90 Base) MCG/ACT inhaler Inhale 2 puffs into the lungs every 6 (six) hours as needed for wheezing or shortness of breath. 1 Inhaler 0  . amLODipine (NORVASC) 5 MG tablet Take 1 tablet (5 mg total) by mouth daily. 30 tablet 0  . ferrous sulfate 325 (65 FE) MG tablet Take 1 tablet (325 mg total) by mouth daily with breakfast. 30 tablet 0  . mometasone-formoterol (DULERA) 100-5 MCG/ACT AERO Inhale 2 puffs into the lungs 2 (two) times daily. 1 Inhaler 1  . [name] (CYANOCOBALAMIN) 500 MCG tablet Take 500 mcg by mouth daily.    . Vitamin D, Ergocalciferol, (DRISDOL) 1.25 MG (50000 UT) CAPS capsule Take 1 capsule (50,000 Units total) by mouth 2 (two) times a week. 10 capsule 0  . zinc gluconate 50 MG tablet Take 50 mg by mouth daily.     [name].     PAST MEDICAL HISTORY: Past Medical History:  Diagnosis Date  . Anemia   . Asthma   . Hepatitis B   . High cholesterol   . Hypertension    G2 - preeclampsia, CHTN  . Infertility, female   . Lactose intolerance   . Obesity   . Obesity   . Prediabetes 04/01/2018  . Seasonal allergies   . Shortness of breath   . Swelling of both lower extremities     PAST SURGICAL HISTORY: Past Surgical History:  Procedure Laterality Date  . CESAREAN SECTION    . HIP SURGERY    . TUBAL LIGATION Bilateral 05/05/2014   Procedure: POST PARTUM TUBAL LIGATION;  Surgeon: Donnamae Jude, MD;  Location: [facility];  Service: Gynecology;  Laterality: Bilateral;    SOCIAL HISTORY: Social History   Tobacco Use  . Smoking status: Former Smoker    Years: 0.50    Types: Cigarettes    Last attempt to quit: 08/25/1993    Years since quitting: 25.3  . Smokeless tobacco: Never Used  . Tobacco comment: one pack of cigarettes weekly- 05/27/18  Substance Use Topics  . Alcohol use: Yes    Comment: occ  . Drug use: No     FAMILY HISTORY: Family History  Problem Relation Age of Onset  . Heart disease Mother   . Thyroid disease Mother   . High blood pressure Mother   . Obesity Mother   . COPD Father    ROS: Review of Systems  Constitutional: Positive for malaise/fatigue (mild).  Respiratory: Negative for shortness of breath.   Cardiovascular: Negative for chest pain.  Gastrointestinal: Negative for nausea and vomiting.  Musculoskeletal:       Negative for muscle weakness.   PHYSICAL EXAM: Pt in no acute distress  RECENT LABS AND TESTS: BMET    Component Value Date/Time   NA 142 08/31/2018 1256   K 4.6 08/31/2018 1256   CL 99 08/31/2018 1256   CO2 26 08/31/2018 1256   GLUCOSE 86 08/31/2018 1256   GLUCOSE 116 (H) 02/11/2018 0314   BUN 4 (L) 08/31/2018 1256   CREATININE 0.75 08/31/2018 1256   CREATININE 0.61 03/20/2014 1652  CALCIUM 9.5 08/31/2018 1256   GFRNONAA 99 08/31/2018 1256   GFRAA 114 08/31/2018 1256   Lab Results  Component Value Date   HGBA1C 5.7 (H) 08/31/2018   HGBA1C 5.7 (H) 03/02/2018   Lab Results  Component Value Date   INSULIN 10.4 08/31/2018   CBC    Component Value Date/Time   WBC 3.6 (L) 05/27/2018 1144   RBC 5.14 (H) 05/27/2018 1144   HGB 12.1 05/27/2018 1144   HGB 11.3 05/05/2018 1009   HGB 10.4 11/21/2013   HCT 39.1 05/27/2018 1144   HCT 38.3 05/05/2018 1009   HCT 33 11/21/2013   PLT 326.0 05/27/2018 1144   PLT 312 05/05/2018 1009   PLT 306 11/21/2013   MCV 76.0 (L) 05/27/2018 1144   MCV 76 (L) 05/05/2018 1009   MCH 22.5 (L) 05/05/2018 1009   MCH 18.8 (L) 02/11/2018 0314   MCHC 31.0 05/27/2018 1144   RDW 17.4 (H) 05/27/2018 1144   RDW 18.1 (H) 05/05/2018 1009   LYMPHSABS 1.8 05/27/2018 1144   LYMPHSABS 1.4 05/05/2018 1009   MONOABS 0.5 05/27/2018 1144   EOSABS 0.1 05/27/2018 1144   EOSABS 0.1 05/05/2018 1009   BASOSABS 0.0 05/27/2018 1144   BASOSABS 0.0 05/05/2018 1009   Iron/TIBC/Ferritin/ %Sat    Component Value Date/Time   IRON 17  (L) 02/09/2018 0110   TIBC 465 (H) 02/09/2018 0110   FERRITIN 22 02/09/2018 0110   IRONPCTSAT 4 (L) 02/09/2018 0110   Lipid Panel     Component Value Date/Time   CHOL 160 03/02/2018 0954   TRIG 75 03/02/2018 0954   HDL 48 03/02/2018 0954   CHOLHDL 3.3 03/02/2018 0954   CHOLHDL 3.8 Ratio 05/07/2009 2031   VLDL 14 05/07/2009 2031   LDLCALC 97 03/02/2018 0954   Hepatic Function Panel     Component Value Date/Time   PROT 7.2 08/31/2018 1256   ALBUMIN 4.1 08/31/2018 1256   AST 18 08/31/2018 1256   ALT 16 08/31/2018 1256   ALT 15 06/17/2018 0939   ALKPHOS 96 08/31/2018 1256   BILITOT 0.9 08/31/2018 1256      Component Value Date/Time   TSH 3.140 03/02/2018 0954   TSH 1.950 02/17/2018 1631   TSH 1.511 05/07/2009 2031   Results for MOSELLE, RISTER (MRN [id number]) as of 12/20/2018 13:06  Ref. Range 08/31/2018 12:56  Vitamin D, 25-Hydroxy Latest Ref Range: 30.0 - 100.0 ng/mL <4.0 (L)   I, Michaelene Song, am acting as Location manager for [employer], FNP-C.  I have reviewed the above documentation for accuracy and completeness, and I agree with the above.  - Ayaan Shutes, FNP-C.

## 2018-12-22 ENCOUNTER — Encounter (INDEPENDENT_AMBULATORY_CARE_PROVIDER_SITE_OTHER): Payer: Self-pay | Admitting: Family Medicine

## 2018-12-22 NOTE — Telephone Encounter (Signed)
Please review

## 2019-01-03 ENCOUNTER — Other Ambulatory Visit: Payer: Self-pay

## 2019-01-03 ENCOUNTER — Ambulatory Visit (INDEPENDENT_AMBULATORY_CARE_PROVIDER_SITE_OTHER): Payer: Managed Care, Other (non HMO) | Admitting: Family Medicine

## 2019-01-03 ENCOUNTER — Encounter (INDEPENDENT_AMBULATORY_CARE_PROVIDER_SITE_OTHER): Payer: Self-pay | Admitting: Family Medicine

## 2019-01-03 DIAGNOSIS — R7303 Prediabetes: Secondary | ICD-10-CM

## 2019-01-03 DIAGNOSIS — Z6841 Body Mass Index (BMI) 40.0 and over, adult: Secondary | ICD-10-CM | POA: Diagnosis not present

## 2019-01-03 NOTE — Progress Notes (Signed)
Office: [phone]  /  Fax: [phone] TeleHealth Visit:  Ann Woods has verbally consented to this TeleHealth visit today. The patient is located at home, the provider is located at the [employer] [facility] office. The participants in this visit include the listed provider and patient. The visit was conducted today via telephone call (Webex not working).  HPI:   Chief Complaint: OBESITY Ann Woods is here to discuss her progress with her obesity treatment plan. She is   following the Pescatarian or vegetarian plan + 300 calories and is following her eating plan approximately 100% of the time. She states she is walking 20 minutes 2 times per week. [name] states she weighed 396.8 lbs on 12/22/2018. She reports sticking to the plan very well. We were unable to weigh the patient today for this TeleHealth visit. She feels as if she has lost 3 lbs since her last visit. She has lost 10 lbs since starting treatment with [place].  Pre-Diabetes Ann Woods has a diagnosis of prediabetes based on her elevated Hgb A1c and was informed this puts her at greater risk of developing diabetes. She is not taking metformin currently and declines this for now. She continues to work on diet and exercise to decrease risk of diabetes. She reports polyphagia in the late afternoon and after dinner. Lab Results  Component Value Date   HGBA1C 5.7 (H) 08/31/2018    ASSESSMENT AND PLAN:  Prediabetes  Class 3 severe obesity with serious comorbidity and body mass index (BMI) of 60.0 to 69.9 in adult, unspecified obesity type [facility])  PLAN:  Pre-Diabetes Ann Woods will continue to work on weight loss, exercise, and decreasing simple carbohydrates in her diet to help decrease the risk of diabetes. We discussed metformin including benefits and risks. Ann Woods states she will think about metformin and agrees to follow-up with [place] as directed to monitor her progress.  I spent > than 50% of the 15 minute visit on  counseling as documented in the note.  Obesity Ann Woods is currently in the action stage of change. As such, her goal is to continue with weight loss efforts. She has agreed to follow the Vegetarian or Pescatarian plan + 300 calories. Ann Woods has been instructed to weigh before her next visit. Ann Woods has been instructed to continue her current exercise regimen for weight loss and overall health benefits. We discussed the following Behavioral Modification Strategies today: planning for success.  Ann Woods has agreed to follow-up with our clinic in 2 weeks. She was informed of the importance of frequent follow-up visits to maximize her success with intensive lifestyle modifications for her multiple health conditions.  ALLERGIES: No Known Allergies  MEDICATIONS: Current Outpatient Medications on File Prior to Visit  Medication Sig Dispense Refill  . albuterol (PROVENTIL HFA;VENTOLIN HFA) 108 (90 Base) MCG/ACT inhaler Inhale 2 puffs into the lungs every 6 (six) hours as needed for wheezing or shortness of breath. 1 Inhaler 0  . amLODipine (NORVASC) 5 MG tablet Take 1 tablet (5 mg total) by mouth daily. 30 tablet 0  . ferrous sulfate 325 (65 FE) MG tablet Take 1 tablet (325 mg total) by mouth daily with breakfast. 30 tablet 0  . mometasone-formoterol (DULERA) 100-5 MCG/ACT AERO Inhale 2 puffs into the lungs 2 (two) times daily. 1 Inhaler 1  . [name] (CYANOCOBALAMIN) 500 MCG tablet Take 500 mcg by mouth daily.    . Vitamin D, Ergocalciferol, (DRISDOL) 1.25 MG (50000 UT) CAPS capsule Take 1 capsule (50,000 Units total) by mouth 2 (  two) times a week. 10 capsule 0  . zinc gluconate 50 MG tablet Take 50 mg by mouth daily.     [name].     PAST MEDICAL HISTORY: Past Medical History:  Diagnosis Date  . Anemia   . Asthma   . Hepatitis B   . High cholesterol   . Hypertension    G2 - preeclampsia, CHTN  . Infertility, female   .  Lactose intolerance   . Obesity   . Obesity   . Prediabetes 04/01/2018  . Seasonal allergies   . Shortness of breath   . Swelling of both lower extremities     PAST SURGICAL HISTORY: Past Surgical History:  Procedure Laterality Date  . CESAREAN SECTION    . HIP SURGERY    . TUBAL LIGATION Bilateral 05/05/2014   Procedure: POST PARTUM TUBAL LIGATION;  Surgeon: Donnamae Jude, MD;  Location: [facility];  Service: Gynecology;  Laterality: Bilateral;    SOCIAL HISTORY: Social History   Tobacco Use  . Smoking status: Former Smoker    Years: 0.50    Types: Cigarettes    Last attempt to quit: 08/25/1993    Years since quitting: 25.3  . Smokeless tobacco: Never Used  . Tobacco comment: one pack of cigarettes weekly- 05/27/18  Substance Use Topics  . Alcohol use: Yes    Comment: occ  . Drug use: No    FAMILY HISTORY: Family History  Problem Relation Age of Onset  . Heart disease Mother   . Thyroid disease Mother   . High blood pressure Mother   . Obesity Mother   . COPD Father    ROS: Review of Systems  Endo/Heme/Allergies:       Positive for polyphagia in the late afternoon and after dinner.   PHYSICAL EXAM: Pt in no acute distress  RECENT LABS AND TESTS: BMET    Component Value Date/Time   NA 142 08/31/2018 1256   K 4.6 08/31/2018 1256   CL 99 08/31/2018 1256   CO2 26 08/31/2018 1256   GLUCOSE 86 08/31/2018 1256   GLUCOSE 116 (H) 02/11/2018 0314   BUN 4 (L) 08/31/2018 1256   CREATININE 0.75 08/31/2018 1256   CREATININE 0.61 03/20/2014 1652   CALCIUM 9.5 08/31/2018 1256   GFRNONAA 99 08/31/2018 1256   GFRAA 114 08/31/2018 1256   Lab Results  Component Value Date   HGBA1C 5.7 (H) 08/31/2018   HGBA1C 5.7 (H) 03/02/2018   Lab Results  Component Value Date   INSULIN 10.4 08/31/2018   CBC    Component Value Date/Time   WBC 3.6 (L) 05/27/2018 1144   RBC 5.14 (H) 05/27/2018 1144   HGB 12.1 05/27/2018 1144   HGB 11.3 05/05/2018 1009   HGB 10.4 11/21/2013    HCT 39.1 05/27/2018 1144   HCT 38.3 05/05/2018 1009   HCT 33 11/21/2013   PLT 326.0 05/27/2018 1144   PLT 312 05/05/2018 1009   PLT 306 11/21/2013   MCV 76.0 (L) 05/27/2018 1144   MCV 76 (L) 05/05/2018 1009   MCH 22.5 (L) 05/05/2018 1009   MCH 18.8 (L) 02/11/2018 0314   MCHC 31.0 05/27/2018 1144   RDW 17.4 (H) 05/27/2018 1144   RDW 18.1 (H) 05/05/2018 1009   LYMPHSABS 1.8 05/27/2018 1144   LYMPHSABS 1.4 05/05/2018 1009   MONOABS 0.5 05/27/2018 1144   EOSABS 0.1 05/27/2018 1144   EOSABS 0.1 05/05/2018 1009   BASOSABS 0.0 05/27/2018 1144  BASOSABS 0.0 05/05/2018 1009   Iron/TIBC/Ferritin/ %Sat    Component Value Date/Time   IRON 17 (L) 02/09/2018 0110   TIBC 465 (H) 02/09/2018 0110   FERRITIN 22 02/09/2018 0110   IRONPCTSAT 4 (L) 02/09/2018 0110   Lipid Panel     Component Value Date/Time   CHOL 160 03/02/2018 0954   TRIG 75 03/02/2018 0954   HDL 48 03/02/2018 0954   CHOLHDL 3.3 03/02/2018 0954   CHOLHDL 3.8 Ratio 05/07/2009 2031   VLDL 14 05/07/2009 2031   LDLCALC 97 03/02/2018 0954   Hepatic Function Panel     Component Value Date/Time   PROT 7.2 08/31/2018 1256   ALBUMIN 4.1 08/31/2018 1256   AST 18 08/31/2018 1256   ALT 16 08/31/2018 1256   ALT 15 06/17/2018 0939   ALKPHOS 96 08/31/2018 1256   BILITOT 0.9 08/31/2018 1256      Component Value Date/Time   TSH 3.140 03/02/2018 0954   TSH 1.950 02/17/2018 1631   TSH 1.511 05/07/2009 2031   Results for GERICA, Ann Woods (MRN [id number]) as of 01/03/2019 15:10  Ref. Range 08/31/2018 12:56  Vitamin D, 25-Hydroxy Latest Ref Range: 30.0 - 100.0 ng/mL <4.0 (L)   I, Michaelene Song, am acting as Location manager for [employer], FNP-C.  I have reviewed the above documentation for accuracy and completeness, and I agree with the above.  - Jerline Linzy, FNP-C.

## 2019-01-09 ENCOUNTER — Encounter: Payer: Self-pay | Admitting: Family Medicine

## 2019-01-09 DIAGNOSIS — R942 Abnormal results of pulmonary function studies: Secondary | ICD-10-CM

## 2019-01-09 DIAGNOSIS — R0609 Other forms of dyspnea: Secondary | ICD-10-CM

## 2019-01-10 ENCOUNTER — Ambulatory Visit (INDEPENDENT_AMBULATORY_CARE_PROVIDER_SITE_OTHER): Payer: Managed Care, Other (non HMO) | Admitting: Family Medicine

## 2019-01-10 ENCOUNTER — Other Ambulatory Visit: Payer: Self-pay

## 2019-01-10 ENCOUNTER — Encounter: Payer: Self-pay | Admitting: Family Medicine

## 2019-01-10 VITALS — Temp 97.0°F | Wt 389.0 lb

## 2019-01-10 DIAGNOSIS — J309 Allergic rhinitis, unspecified: Secondary | ICD-10-CM | POA: Diagnosis not present

## 2019-01-10 DIAGNOSIS — J3089 Other allergic rhinitis: Secondary | ICD-10-CM

## 2019-01-10 MED ORDER — ALBUTEROL SULFATE HFA 108 (90 BASE) MCG/ACT IN AERS: 2.0000 | INHALATION_SPRAY | Freq: Four times a day (QID) | RESPIRATORY_TRACT | 0 refills | Status: DC | PRN

## 2019-01-10 MED ORDER — MOMETASONE FURO-FORMOTEROL FUM 100-5 MCG/ACT IN AERO: 2.0000 | INHALATION_SPRAY | Freq: Two times a day (BID) | RESPIRATORY_TRACT | 0 refills | Status: DC

## 2019-01-10 NOTE — Progress Notes (Addendum)
Subjective:   Documentation for virtual audio and video telecommunications through [place] encounter:  The patient was located at home. 2 patient identifiers used.  The provider was located in the office. The patient did consent to this visit and is aware of possible charges through their insurance for this visit.  The other persons participating in this telemedicine service were her daughter.      Patient ID: Ann Woods, female    DOB: [birth year], 43 y.o.   MRN: [id number]  HPI Chief Complaint  Patient presents with  . asthma    asthma- just refilled both inhalers today.    She is requesting refill of inhalers today. Complains of sneezing, itchy, watery eyes and nasal congestion after cutting her grass yesterday. She then felt short of breath and was coughing while mowing the grass. No wheezing.  States she feels much better today. No coughing, shortness of breath.  Reports history of allergies to grass, trees but has never been tested. Would like to be referred to an allergist.  Has taken Allegra in the past but states it did not work.  States she is now taking 1/2 benadryl and a Zyrtec at bedtime.   Denies fever, chills, dizziness, chest pain, palpitations, abdominal pain, N/V/D, LE edema.   Evaluated by pulmonology, Dr. Vaughan Browner and there was no evidence of asthma or COPD. Suspected restriction due to body habitus. No evidence of interstitial lung disease.  On 09/08/2018 she was advised to stop inhalers and to continue with weight loss.   She is under the care of [facility] for weight loss. Doing well.   Taking amlodipine for HTN. This was started by Jake Bathe, NP. She is checking her BP some days. Has follow up with them next week.   LMP: 12/10/2018 Tubal ligation   Followed by ID for hepatitis and has upcoming lab appt.   Review of Systems Pertinent positives and negatives in the history of present illness.     Objective:   Physical  Exam Temp (!) 97 F (36.1 C) (Oral)   Wt (!) 389 lb (176.4 kg)   BMI 64.73 kg/m   Alert and oriented and in no acute distress.  Respirations unlabored.  Speaking in complete sentences without difficulty.  Normal speech, mood and thought process.  Unable to perform physical exam due to virtual visit.      Assessment & Plan:  Environmental and seasonal allergies - Plan: Ambulatory referral to Allergy  Allergic rhinitis, unspecified seasonality, unspecified trigger  Morbid obesity ([place])  Discussed limitations of a virtual visit. Reviewed patient's chart including [place] weight management recommendations and pulmonologist recommendations. Evaluation at her pulmonology office revealed no asthma or COPD and shortness of breath appeared to be related to body habitus.  She is working on weight loss.  Congratulated her on making healthy lifestyle changes. She does have underlying allergies that appear to flareup every time she cuts the grass or with certain trees and may have other unknown triggers.  She will continue on Zyrtec for now.  I will refer her to an allergist per her request. Advised to try and avoid allergy triggers or to wear a mask if she has to mow again.  Discussed that she does not need the [facility] and albuterol.  She agrees. She is under the care of infectious disease for chronic hepatitis. She will continue seeing [facility].  They are also working on lowering her blood pressure. Follow-up as needed.  Time  spent on call was 15 minutes and in review of previous records 5 minutes total.  This virtual service is not related to other E/M service within previous 7 days.

## 2019-01-13 ENCOUNTER — Other Ambulatory Visit: Payer: Self-pay

## 2019-01-13 ENCOUNTER — Other Ambulatory Visit: Payer: Managed Care, Other (non HMO)

## 2019-01-13 DIAGNOSIS — B181 Chronic viral hepatitis B without delta-agent: Secondary | ICD-10-CM

## 2019-01-18 ENCOUNTER — Encounter (INDEPENDENT_AMBULATORY_CARE_PROVIDER_SITE_OTHER): Payer: Self-pay | Admitting: Family Medicine

## 2019-01-18 ENCOUNTER — Other Ambulatory Visit: Payer: Self-pay

## 2019-01-18 ENCOUNTER — Ambulatory Visit (INDEPENDENT_AMBULATORY_CARE_PROVIDER_SITE_OTHER): Payer: Managed Care, Other (non HMO) | Admitting: Family Medicine

## 2019-01-18 DIAGNOSIS — R7303 Prediabetes: Secondary | ICD-10-CM

## 2019-01-18 DIAGNOSIS — Z6841 Body Mass Index (BMI) 40.0 and over, adult: Secondary | ICD-10-CM

## 2019-01-19 ENCOUNTER — Encounter (INDEPENDENT_AMBULATORY_CARE_PROVIDER_SITE_OTHER): Payer: Self-pay | Admitting: Family Medicine

## 2019-01-19 NOTE — Progress Notes (Signed)
Office: [phone]  /  Fax: [phone] TeleHealth Visit:  TRISA CRANOR has verbally consented to this TeleHealth visit today. The patient is located at work, the provider is located at the [employer] [facility] office. The participants in this visit include the listed provider and patient. The visit was conducted today via telephone call (patient does not have video capability).  HPI:   Chief Complaint: OBESITY Ann Woods is here to discuss her progress with her obesity treatment plan. She is on our protein rich vegetarian plan or Pescatarian plan and is following her eating plan approximately 100% of the time. She states she is walking 20 minutes 3 times per week. Ann Woods states she has not weighed recently. She is doing well on the plan and is sticking to the plan 100%. She feels she has lost weight, though she has not weighed recently.  We were unable to weigh the patient today for this TeleHealth visit. She feels as if she has maintained her weight since her last visit. She has lost 10 lbs since starting treatment with [place].  Pre-Diabetes Ann Woods has a diagnosis of prediabetes based on her elevated Hgb A1c and was informed this puts her at greater risk of developing diabetes. Her last A1c level was 5.7 on 08/31/2018. She is not taking metformin currently and continues to work on diet and exercise to decrease risk of diabetes. She denies polyphagia between meals.  ASSESSMENT AND PLAN:  Prediabetes  Class 3 severe obesity with serious comorbidity and body mass index (BMI) of 60.0 to 69.9 in adult, unspecified obesity type [facility])  PLAN:  Pre-Diabetes Ann Woods will continue to work on weight loss, exercise, and decreasing simple carbohydrates in her diet to help decrease the risk of diabetes. Ann Woods will continue her meal plan and follow-up with [place] as directed to monitor her progress. Lab Results  Component Value Date   HGBA1C 5.7 (H) 08/31/2018    Obesity Ann Woods is  currently in the action stage of change. As such, her goal is to continue with weight loss efforts. She has agreed to follow our protein rich vegetarian plan or Pescatarian plan + 300 calories. Ann Woods has been instructed to continue her current exercise regimen for weight loss and overall health benefits. We discussed the following Behavioral Modification Strategies today: planning for success.  Ann Woods has agreed to follow-up with our clinic in 2-3 weeks. She was informed of the importance of frequent follow-up visits to maximize her success with intensive lifestyle modifications for her multiple health conditions.  ALLERGIES: No Known Allergies  MEDICATIONS: Current Outpatient Medications on File Prior to Visit  Medication Sig Dispense Refill  . amLODipine (NORVASC) 5 MG tablet Take 1 tablet (5 mg total) by mouth daily. 30 tablet 0  . cetirizine (ZYRTEC) 10 MG tablet Take 10 mg by mouth daily.    . ferrous sulfate 325 (65 FE) MG tablet Take 1 tablet (325 mg total) by mouth daily with breakfast. 30 tablet 0  . [name] (CYANOCOBALAMIN) 500 MCG tablet Take 500 mcg by mouth daily.    . Vitamin D, Ergocalciferol, (DRISDOL) 1.25 MG (50000 UT) CAPS capsule Take 1 capsule (50,000 Units total) by mouth 2 (two) times a week. 10 capsule 0  . zinc gluconate 50 MG tablet Take 50 mg by mouth daily.     [name].     PAST MEDICAL HISTORY: Past Medical History:  Diagnosis Date  . Anemia   . Asthma   .  Hepatitis B   . High cholesterol   . Hypertension    G2 - preeclampsia, CHTN  . Infertility, female   . Lactose intolerance   . Obesity   . Obesity   . Prediabetes 04/01/2018  . Seasonal allergies   . Shortness of breath   . Swelling of both lower extremities     PAST SURGICAL HISTORY: Past Surgical History:  Procedure Laterality Date  . CESAREAN SECTION    . HIP SURGERY    . TUBAL LIGATION Bilateral 05/05/2014   Procedure:  POST PARTUM TUBAL LIGATION;  Surgeon: Donnamae Jude, MD;  Location: [facility];  Service: Gynecology;  Laterality: Bilateral;    SOCIAL HISTORY: Social History   Tobacco Use  . Smoking status: Former Smoker    Years: 0.50    Types: Cigarettes    Last attempt to quit: 08/25/1993    Years since quitting: 25.4  . Smokeless tobacco: Never Used  . Tobacco comment: one pack of cigarettes weekly- 05/27/18  Substance Use Topics  . Alcohol use: Yes    Comment: occ  . Drug use: No    FAMILY HISTORY: Family History  Problem Relation Age of Onset  . Heart disease Mother   . Thyroid disease Mother   . High blood pressure Mother   . Obesity Mother   . COPD Father    ROS: Review of Systems  Endo/Heme/Allergies:       Negative for polyphagia between meals.   PHYSICAL EXAM: Pt in no acute distress  RECENT LABS AND TESTS: BMET    Component Value Date/Time   NA 141 01/13/2019 1012   NA 142 08/31/2018 1256   K 4.6 01/13/2019 1012   CL 104 01/13/2019 1012   CO2 28 01/13/2019 1012   GLUCOSE 92 01/13/2019 1012   BUN 10 01/13/2019 1012   BUN 4 (L) 08/31/2018 1256   CREATININE 0.84 01/13/2019 1012   CALCIUM 9.3 01/13/2019 1012   GFRNONAA 86 01/13/2019 1012   GFRAA 99 01/13/2019 1012   Lab Results  Component Value Date   HGBA1C 5.7 (H) 08/31/2018   HGBA1C 5.7 (H) 03/02/2018   Lab Results  Component Value Date   INSULIN 10.4 08/31/2018   CBC    Component Value Date/Time   WBC 3.6 (L) 05/27/2018 1144   RBC 5.14 (H) 05/27/2018 1144   HGB 12.1 05/27/2018 1144   HGB 11.3 05/05/2018 1009   HGB 10.4 11/21/2013   HCT 39.1 05/27/2018 1144   HCT 38.3 05/05/2018 1009   HCT 33 11/21/2013   PLT 326.0 05/27/2018 1144   PLT 312 05/05/2018 1009   PLT 306 11/21/2013   MCV 76.0 (L) 05/27/2018 1144   MCV 76 (L) 05/05/2018 1009   MCH 22.5 (L) 05/05/2018 1009   MCH 18.8 (L) 02/11/2018 0314   MCHC 31.0 05/27/2018 1144   RDW 17.4 (H) 05/27/2018 1144   RDW 18.1 (H) 05/05/2018 1009    LYMPHSABS 1.8 05/27/2018 1144   LYMPHSABS 1.4 05/05/2018 1009   MONOABS 0.5 05/27/2018 1144   EOSABS 0.1 05/27/2018 1144   EOSABS 0.1 05/05/2018 1009   BASOSABS 0.0 05/27/2018 1144   BASOSABS 0.0 05/05/2018 1009   Iron/TIBC/Ferritin/ %Sat    Component Value Date/Time   IRON 17 (L) 02/09/2018 0110   TIBC 465 (H) 02/09/2018 0110   FERRITIN 22 02/09/2018 0110   IRONPCTSAT 4 (L) 02/09/2018 0110   Lipid Panel     Component Value Date/Time   CHOL 160 03/02/2018 0954  TRIG 75 03/02/2018 0954   HDL 48 03/02/2018 0954   CHOLHDL 3.3 03/02/2018 0954   CHOLHDL 3.8 Ratio 05/07/2009 2031   VLDL 14 05/07/2009 2031   LDLCALC 97 03/02/2018 0954   Hepatic Function Panel     Component Value Date/Time   PROT 7.1 01/13/2019 1012   PROT 7.2 08/31/2018 1256   ALBUMIN 4.1 08/31/2018 1256   AST 16 01/13/2019 1012   ALT 10 01/13/2019 1012   ALT 15 06/17/2018 0939   ALKPHOS 96 08/31/2018 1256   BILITOT 0.5 01/13/2019 1012   BILITOT 0.9 08/31/2018 1256      Component Value Date/Time   TSH 3.140 03/02/2018 0954   TSH 1.950 02/17/2018 1631   TSH 1.511 05/07/2009 2031   Results for HARUNA, ROHLFS (MRN [id number]) as of 01/19/2019 09:42  Ref. Range 08/31/2018 12:56  Vitamin D, 25-Hydroxy Latest Ref Range: 30.0 - 100.0 ng/mL <4.0 (L)    I, Michaelene Song, am acting as Location manager for [employer], FNP-C.  I have reviewed the above documentation for accuracy and completeness, and I agree with the above.  - Judie Hollick, FNP-C.

## 2019-01-21 ENCOUNTER — Other Ambulatory Visit: Payer: Self-pay

## 2019-01-21 ENCOUNTER — Ambulatory Visit (INDEPENDENT_AMBULATORY_CARE_PROVIDER_SITE_OTHER): Payer: Managed Care, Other (non HMO) | Admitting: Allergy

## 2019-01-21 ENCOUNTER — Encounter: Payer: Self-pay | Admitting: Allergy

## 2019-01-21 VITALS — BP 148/90 | HR 82 | Temp 98.8°F | Resp 18 | Ht 66.0 in | Wt >= 6400 oz

## 2019-01-21 DIAGNOSIS — H1013 Acute atopic conjunctivitis, bilateral: Secondary | ICD-10-CM | POA: Diagnosis not present

## 2019-01-21 DIAGNOSIS — J3089 Other allergic rhinitis: Secondary | ICD-10-CM

## 2019-01-21 DIAGNOSIS — T781XXD Other adverse food reactions, not elsewhere classified, subsequent encounter: Secondary | ICD-10-CM | POA: Diagnosis not present

## 2019-01-21 MED ORDER — AZELASTINE HCL 0.05 % OP SOLN: OPHTHALMIC | 5 refills | Status: DC

## 2019-01-21 MED ORDER — AZELASTINE HCL 0.1 % NA SOLN: NASAL | 5 refills | Status: AC

## 2019-01-21 NOTE — Patient Instructions (Addendum)
Allergic rhinitis with conjunctivitis  - environmental allergy skin prick testing is positive to grass pollens, tree pollens, weed pollens, molds, dust mites, cat, dog, mixed feathers (ie. Down), cockroach, mouse  - allergen avoidance measures discussed/handouts provided  - for general allergy symptom control try Xyzal 5mg  daily as needed.  Xyzal is a long-acting antihistamine to replaces Zyrtec  - for itchy, watery, red eyes use Azelastine eye drop 1 drop each eye twice as needed  - for nasal congestion continue use of Flonase 2 sprays each nostril daily for 1-2 weeks at a time before stopping once improved  - for nasal drainage/post-nasal drip and throat clearing recommend use of nasal antihistamine, Astelin 2 sprays each nostril twice a day as needed  - allergen immunotherapy discussed today including protocol, benefits and risk.  Informational handout provided.  If interested in this therapuetic option you can check with your insurance carrier for coverage.  Let us know if you would like to proceed with this option.    - recommend wearing a mask with seal (like a N95 mask) while performing yard-work or cutting the grass to decrease inhalation of the grass pollens.    Adverse food reaction   - throat itch/clearing after shrimp ingestion  - skin testing to shellfish panel is negative  Follow-up 4-6 months or sooner if needed

## 2019-01-21 NOTE — Progress Notes (Signed)
New Patient Note  RE: Ann Woods MRN: [id number] DOB: May 05, 1976 Date of Office Visit: 01/21/2019  Referring provider: Girtha Rm, NP-C Primary care provider: Girtha Rm, NP-C  Chief Complaint: allergies  History of present illness: Ann Woods is a 43 y.o. female presenting today for consultation for environmental and seasonal allergies.    She reports when cutting the grass she develops watery and red eyes, throat itch, runny nose, SOB and wheezing.  She denies having these symptoms at any other times.   She has been taking zyrtec when she knows she is going to cut the grass.  She would also take benadryl as well which she reports helps with the itch.   She states allegra would raise her BP years ago when she was taking it in the past.  Claritin she didn't feel help her symptoms.  She also has used Flonase in the past but states she stopped using it and doesn't know why she stopped using.    She has been evaluated by Dr. Vaughan Browner in pulmonology in Jan 2020 for [place] deemed to be related to body habitus/weight and there was no concern for asthma or COPD based on studies.   She states she had PNA in June 2019 and had an albuterol inhaler due to SOB related to PNA.    She denies history of childhood asthma.  She does report having episodes of eczema in childhood around cleaning with bleach.  No history of food allergy.  She has cut back on eating meats.  She states when she would eat shrimp she does feel a throat itch.     Review of systems: Review of Systems  Constitutional: Positive for weight loss. Negative for chills, fever and malaise/fatigue.  HENT: Positive for congestion. Negative for ear discharge, nosebleeds, sinus pain and sore throat.   Eyes: Positive for redness. Negative for pain and discharge.  Respiratory: Positive for shortness of breath and wheezing. Negative for cough.   Cardiovascular: Negative for chest pain.  Gastrointestinal: Negative for  abdominal pain, constipation, diarrhea, heartburn, nausea and vomiting.  Musculoskeletal: Negative for joint pain.  Skin: Negative for itching and rash.  Neurological: Negative for headaches.    All other systems negative unless noted above in HPI  Past medical history: Past Medical History:  Diagnosis Date  . Anemia   . Hepatitis B   . High cholesterol   . Hypertension    G2 - preeclampsia, CHTN  . Infertility, female   . Lactose intolerance   . Obesity   . Obesity   . Prediabetes 04/01/2018  . Seasonal allergies   . Shortness of breath   . Swelling of both lower extremities   . Urticaria     Past surgical history: Past Surgical History:  Procedure Laterality Date  . CESAREAN SECTION    . HIP SURGERY    . TUBAL LIGATION Bilateral 05/05/2014   Procedure: POST PARTUM TUBAL LIGATION;  Surgeon: Donnamae Jude, MD;  Location: [facility];  Service: Gynecology;  Laterality: Bilateral;    Family history:  Family History  Problem Relation Age of Onset  . Heart disease Mother   . Thyroid disease Mother   . High blood pressure Mother   . Obesity Mother   . COPD Father   . Allergic rhinitis Neg Hx   . Angioedema Neg Hx   . Asthma Neg Hx   . Eczema Neg Hx   . Immunodeficiency Neg Hx   . Urticaria  Neg Hx     Social history: Lives in a home with carpeting in the family room with electric heating and central cooling.  Dog in the home.  No concern for water damage, mildew or roaches in the home.  She is an Glass blower/designer.  Denies a smoking history.   Medication List: Allergies as of 01/21/2019   No Known Allergies     Medication List       Accurate as of Jan 21, 2019 10:55 AM. If you have any questions, ask your nurse or doctor.        albuterol 108 (90 Base) MCG/ACT inhaler Commonly known as:  VENTOLIN HFA Inhale 2 puffs into the lungs every 6 (six) hours as needed for wheezing or shortness of breath.   amLODipine 5 MG tablet Commonly known as:  NORVASC Take 1  tablet (5 mg total) by mouth daily.   cetirizine 10 MG tablet Commonly known as:  ZYRTEC Take 10 mg by mouth daily.   ferrous sulfate 325 (65 FE) MG tablet Take 1 tablet (325 mg total) by mouth daily with breakfast.   [name] 500 MCG tablet Commonly known as:  CYANOCOBALAMIN Take 500 mcg by mouth daily.   Vitamin D (Ergocalciferol) 1.25 MG (50000 UT) Caps capsule Commonly known as:  DRISDOL Take 1 capsule (50,000 Units total) by mouth 2 (two) times a week.   zinc gluconate 50 MG tablet Take 50 mg by mouth daily.       Known medication allergies: No Known Allergies   Physical examination: Blood pressure (!) 148/90, pulse 82, temperature 98.8 F (37.1 C), temperature source Oral, resp. rate 18, height 5\' 6"  (1.676 m), weight (!) 400 lb 9.6 oz (181.7 kg), SpO2 96 %.  General: Alert, interactive, in no acute distress, obese. HEENT: PERRLA, TMs pearly gray, turbinates moderately edematous with clear discharge, post-pharynx non erythematous. Neck: Supple without lymphadenopathy. Lungs: Clear to auscultation without wheezing, rhonchi or rales. {no increased work of breathing. CV: Normal S1, S2 without murmurs. Abdomen: Nondistended, nontender. Skin: Warm and dry, without lesions or rashes. Extremities:  No clubbing, cyanosis or edema. Neuro:   Grossly intact.  Diagnositics/Labs:  Allergy testing: environmental allergy skin prick testing is positive to  grass pollens, tree pollens, weed pollens, molds, dust mites, cat, dog, mixed feathers, cockroach, mouse.  Allergy testing results were read and interpreted by provider, documented by clinical staff.  Assessment and plan:   Allergic rhinitis with conjunctivitis  - environmental allergy skin prick testing is positive to grass pollens, tree pollens, weed pollens, molds, dust mites, cat, dog, mixed feathers (ie. Down), cockroach, mouse  - allergen avoidance measures discussed/handouts provided  - for general allergy  symptom control try Xyzal 5mg  daily as needed.  Xyzal is a long-acting antihistamine to replaces Zyrtec  - for itchy, watery, red eyes use Azelastine eye drop 1 drop each eye twice as needed  - for nasal congestion continue use of Flonase 2 sprays each nostril daily for 1-2 weeks at a time before stopping once improved  - for nasal drainage/post-nasal drip and throat clearing recommend use of nasal antihistamine, Astelin 2 sprays each nostril twice a day as needed  - allergen immunotherapy discussed today including protocol, benefits and risk.  Informational handout provided.  If interested in this therapuetic option you can check with your insurance carrier for coverage.  Let us know if you would like to proceed with this option.    - recommend wearing a mask with seal (like  a N95 mask) while performing yard-work or cutting the grass to decrease inhalation of the grass pollens.    Adverse food reaction   - throat itch/clearing after shrimp ingestion  - skin testing to shellfish panel is negative  Follow-up 4-6 months or sooner if needed  I appreciate the opportunity to take part in [place] care. Please do not hesitate to contact me with questions.  Sincerely,   Prudy Feeler, MD Allergy/Immunology [facility]

## 2019-01-24 ENCOUNTER — Encounter: Payer: Self-pay | Admitting: Family Medicine

## 2019-01-24 ENCOUNTER — Other Ambulatory Visit (INDEPENDENT_AMBULATORY_CARE_PROVIDER_SITE_OTHER): Payer: Self-pay | Admitting: Family Medicine

## 2019-01-24 DIAGNOSIS — E559 Vitamin D deficiency, unspecified: Secondary | ICD-10-CM

## 2019-01-24 LAB — COMPLETE METABOLIC PANEL WITH GFR
AG Ratio: 1.2 (calc) (ref 1.0–2.5)
ALT: 10 U/L (ref 6–29)
AST: 16 U/L (ref 10–30)
Albumin: 3.8 g/dL (ref 3.6–5.1)
Alkaline phosphatase (APISO): 79 U/L (ref 31–125)
BUN: 10 mg/dL (ref 7–25)
CO2: 28 mmol/L (ref 20–32)
Calcium: 9.3 mg/dL (ref 8.6–10.2)
Chloride: 104 mmol/L (ref 98–110)
Creat: 0.84 mg/dL (ref 0.50–1.10)
GFR, Est African American: 99 mL/min/{1.73_m2} (ref >=60)
GFR, Est Non African American: 86 mL/min/{1.73_m2} (ref >=60)
Globulin: 3.3 g/dL (calc) (ref 1.9–3.7)
Glucose, Bld: 92 mg/dL (ref 65–99)
Potassium: 4.6 mmol/L (ref 3.5–5.3)
Sodium: 141 mmol/L (ref 135–146)
Total Bilirubin: 0.5 mg/dL (ref 0.2–1.2)
Total Protein: 7.1 g/dL (ref 6.1–8.1)

## 2019-01-24 LAB — HEPATITIS B DNA, ULTRAQUANTITATIVE, PCR
Hepatitis B DNA (Calc): 3.37 Log IU/mL — ABNORMAL HIGH
Hepatitis B DNA: 2340 IU/mL — ABNORMAL HIGH

## 2019-01-24 LAB — HEPATITIS B SURFACE ANTIBODY,QUALITATIVE: Hep B S Ab: NONREACTIVE

## 2019-01-24 LAB — HEPATITIS B SURFACE ANTIGEN: Hepatitis B Surface Ag: REACTIVE — AB

## 2019-01-24 MED ORDER — VITAMIN D (ERGOCALCIFEROL) 1.25 MG (50000 UNIT) PO CAPS: 50000.0000 [IU] | ORAL_CAPSULE | ORAL | 0 refills | Status: DC

## 2019-01-27 ENCOUNTER — Encounter: Payer: Self-pay | Admitting: Family Medicine

## 2019-02-03 ENCOUNTER — Encounter (INDEPENDENT_AMBULATORY_CARE_PROVIDER_SITE_OTHER): Payer: Self-pay | Admitting: Family Medicine

## 2019-02-03 ENCOUNTER — Ambulatory Visit (INDEPENDENT_AMBULATORY_CARE_PROVIDER_SITE_OTHER): Payer: Managed Care, Other (non HMO) | Admitting: Family Medicine

## 2019-02-03 ENCOUNTER — Other Ambulatory Visit: Payer: Self-pay

## 2019-02-03 VITALS — BP 153/90 | HR 67 | Temp 98.1°F | Ht 65.0 in | Wt 394.0 lb

## 2019-02-03 DIAGNOSIS — D508 Other iron deficiency anemias: Secondary | ICD-10-CM | POA: Diagnosis not present

## 2019-02-03 DIAGNOSIS — E559 Vitamin D deficiency, unspecified: Secondary | ICD-10-CM

## 2019-02-03 DIAGNOSIS — R7303 Prediabetes: Secondary | ICD-10-CM | POA: Diagnosis not present

## 2019-02-03 DIAGNOSIS — Z9189 Other specified personal risk factors, not elsewhere classified: Secondary | ICD-10-CM | POA: Diagnosis not present

## 2019-02-03 DIAGNOSIS — Z6841 Body Mass Index (BMI) 40.0 and over, adult: Secondary | ICD-10-CM

## 2019-02-03 DIAGNOSIS — E66813 Obesity, class 3: Secondary | ICD-10-CM

## 2019-02-03 DIAGNOSIS — E538 Deficiency of other specified B group vitamins: Secondary | ICD-10-CM

## 2019-02-03 MED ORDER — VITAMIN D (ERGOCALCIFEROL) 1.25 MG (50000 UNIT) PO CAPS: 50000.0000 [IU] | ORAL_CAPSULE | ORAL | 0 refills | Status: DC

## 2019-02-04 LAB — ANEMIA PANEL
Ferritin: 23 ng/mL (ref 15–150)
Folate, Hemolysate: 358 ng/mL
Folate, RBC: 1122 ng/mL (ref >498)
Hematocrit: 31.9 % — ABNORMAL LOW (ref 34.0–46.6)
Iron Saturation: 10 % — ABNORMAL LOW (ref 15–55)
Iron: 34 ug/dL (ref 27–159)
Retic Ct Pct: 2.5 % (ref 0.6–2.6)
Total Iron Binding Capacity: 355 ug/dL (ref 250–450)
UIBC: 321 ug/dL (ref 131–425)
Vitamin B-12: 315 pg/mL (ref 232–1245)

## 2019-02-04 LAB — CBC WITH DIFFERENTIAL
Basophils Absolute: 0 10*3/uL (ref 0.0–0.2)
Basos: 1 %
EOS (ABSOLUTE): 0.1 10*3/uL (ref 0.0–0.4)
Eos: 4 %
Hemoglobin: 10.4 g/dL — ABNORMAL LOW (ref 11.1–15.9)
Immature Grans (Abs): 0 10*3/uL (ref 0.0–0.1)
Immature Granulocytes: 0 %
Lymphocytes Absolute: 1.5 10*3/uL (ref 0.7–3.1)
Lymphs: 42 %
MCH: 24.9 pg — ABNORMAL LOW (ref 26.6–33.0)
MCHC: 32.6 g/dL (ref 31.5–35.7)
MCV: 77 fL — ABNORMAL LOW (ref 79–97)
Monocytes Absolute: 0.4 10*3/uL (ref 0.1–0.9)
Monocytes: 10 %
Neutrophils Absolute: 1.5 10*3/uL (ref 1.4–7.0)
Neutrophils: 43 %
RBC: 4.17 x10E6/uL (ref 3.77–5.28)
RDW: 16.4 % — ABNORMAL HIGH (ref 11.7–15.4)
WBC: 3.6 10*3/uL (ref 3.4–10.8)

## 2019-02-04 LAB — VITAMIN D 25 HYDROXY (VIT D DEFICIENCY, FRACTURES): Vit D, 25-Hydroxy: 41.7 ng/mL (ref 30.0–100.0)

## 2019-02-04 LAB — INSULIN, RANDOM: INSULIN: 12.7 u[IU]/mL (ref 2.6–24.9)

## 2019-02-04 LAB — HEMOGLOBIN A1C
Est. average glucose Bld gHb Est-mCnc: 120 mg/dL
Hgb A1c MFr Bld: 5.8 % — ABNORMAL HIGH (ref 4.8–5.6)

## 2019-02-07 ENCOUNTER — Encounter (INDEPENDENT_AMBULATORY_CARE_PROVIDER_SITE_OTHER): Payer: Self-pay | Admitting: Family Medicine

## 2019-02-07 NOTE — Progress Notes (Signed)
Office: [phone]  /  Fax: [phone]   HPI:   Chief Complaint: OBESITY Ann Woods is here to discuss her progress with her obesity treatment plan. She is on the vegetarian/Pescatarian plan and is following her eating plan approximately 100% of the time. She states she is exercising 0 minutes 0 times per week. Rachana states she has been on the plan 100% over the last few weeks. She has maintained her current weight during virtual visits within a pound.  She reports growing a vegetable garden. Her weight is (!) 394 lb (178.7 kg) today and has had a weight gain of 1 lb since her last in office visit. She has lost 9 lbs since starting treatment with [place].  Vitamin D deficiency Ann Woods has a diagnosis of Vitamin D deficiency, which is not at goal. Her last Vitamin D level was reported to be <4.0 on 08/31/2018. She is currently taking prescription Vit D and denies nausea, vomiting or muscle weakness. She reports fatigue and this is improved.  At risk for osteopenia and osteoporosis Ann Woods is at higher risk of osteopenia and osteoporosis due to vitamin D deficiency.   Pre-Diabetes Ann Woods has a diagnosis of prediabetes based on her elevated Hgb A1c and was informed this puts her at greater risk of developing diabetes. She is not taking metformin currently and continues to work on diet and exercise to decrease risk of diabetes. She denies polyphagia or hypoglycemia.  Iron Deficiency Anemia Ann Woods has had a hemoglobin as low as 6.9. She is on ferrous sulfate 325 mg QOD. She reports having heavy periods for the first few days. Denies blood in stool. She does report fatigue.  B12 Deficiency Ann Woods has a diagnosis of B12 insufficiency and notes fatigue. She is not on a supplement and denies paresthesias.  ASSESSMENT AND PLAN:    ICD-10-CM   1. Vitamin D deficiency  E55.9 VITAMIN D 25 Hydroxy (Vit-D Deficiency, Fractures)    Vitamin D, Ergocalciferol, (DRISDOL) 1.25 MG (50000 UT) CAPS  capsule  2. At risk for osteoporosis  Z91.89   3. Prediabetes  R73.03 Insulin, random    Hemoglobin A1c  4. Other iron deficiency anemia  D50.8 CBC With Differential    Anemia panel  5. B12 deficiency  E53.8 Vitamin B12  6. Class 3 severe obesity with serious comorbidity and body mass index (BMI) of 60.0 to 69.9 in adult, unspecified obesity type ([place])  E66.01    Z68.44    PLAN:  Vitamin D Deficiency Ann Woods was informed that low Vitamin D levels contributes to fatigue and are associated with obesity, breast, and colon cancer. She agrees to continue to take prescription Vit D @ 50,000 IU every week #4 with 0 refills and will have routine testing of Vitamin D today. She was informed of the risk of over-replacement of Vitamin D and agrees to not increase her dose unless she discusses this with [place] first. Ann Woods agrees to follow-up with our clinic in 2 weeks.  At risk for osteopenia and osteoporosis Ann Woods was given extended  (15 minutes) osteoporosis prevention counseling today. Ann Woods is at risk for osteopenia and osteoporsis due to her vitamin D deficiency. She was encouraged to take her vitamin D and follow her higher calcium diet and increase strengthening exercise to help strengthen her bones and decrease her risk of osteopenia and osteoporosis.   Pre-Diabetes Ann Woods will continue to work on weight loss, exercise, and decreasing simple carbohydrates in her diet to help decrease the risk of diabetes.  Ann Woods will  have fasting insulin and A1c levels checked today and will follow-up with [place] as directed to monitor her progress.  Iron Deficiency Anemia Ann Woods will have CBC with diff and anemia panel checked today.  B12 Deficiency Ann Woods will work on increasing B12 rich foods in her diet. We will plan on rechecking labs today.  Obesity Ann Woods is currently in the action stage of change. As such, her goal is to continue with weight loss efforts. She has agreed to follow our  vegetarian or Pescatarian plan + 300 calories. Ann Woods has been instructed to work up to a goal of 150 minutes of combined cardio and strengthening exercise per week for weight loss and overall health benefits. We discussed the following Behavioral Modification Strategies today: planning for success.  Ann Woods has agreed to follow-up with our clinic in 2 weeks. She was informed of the importance of frequent follow-up visits to maximize her success with intensive lifestyle modifications for her multiple health conditions.  ALLERGIES: No Known Allergies  MEDICATIONS: Current Outpatient Medications on File Prior to Visit  Medication Sig Dispense Refill   albuterol (VENTOLIN HFA) 108 (90 Base) MCG/ACT inhaler Inhale 2 puffs into the lungs every 6 (six) hours as needed for wheezing or shortness of breath.     amLODipine (NORVASC) 5 MG tablet Take 1 tablet (5 mg total) by mouth daily. 30 tablet 0   azelastine (ASTELIN) 0.1 % nasal spray Use 1 spray in each nostril twice daily 30 mL 5   azelastine (OPTIVAR) 0.05 % ophthalmic solution Use 1 drop in each eye twice daily as needed 6 mL 5   cetirizine (ZYRTEC) 10 MG tablet Take 10 mg by mouth daily.     ferrous sulfate 325 (65 FE) MG tablet Take 1 tablet (325 mg total) by mouth daily with breakfast. 30 tablet 0   [name] (CYANOCOBALAMIN) 500 MCG tablet Take 500 mcg by mouth daily.     zinc gluconate 50 MG tablet Take 50 mg by mouth daily.     [name].     PAST MEDICAL HISTORY: Past Medical History:  Diagnosis Date   Anemia    Hepatitis B    High cholesterol    Hypertension    G2 - preeclampsia, CHTN   Infertility, female    Lactose intolerance    Obesity    Obesity    Prediabetes 04/01/2018   Seasonal allergies    Shortness of breath    Swelling of both lower extremities    Urticaria     PAST SURGICAL HISTORY: Past Surgical History:  Procedure  Laterality Date   CESAREAN SECTION     HIP SURGERY     TUBAL LIGATION Bilateral 05/05/2014   Procedure: POST PARTUM TUBAL LIGATION;  Surgeon: Donnamae Jude, MD;  Location: [facility];  Service: Gynecology;  Laterality: Bilateral;    SOCIAL HISTORY: Social History   Tobacco Use   Smoking status: Former Smoker    Years: 0.50    Types: Cigarettes    Quit date: 08/25/1993    Years since quitting: 25.4   Smokeless tobacco: Never Used   Tobacco comment: one pack of cigarettes weekly- 05/27/18  Substance Use Topics   Alcohol use: Not Currently   Drug use: No    FAMILY HISTORY: Family History  Problem Relation Age of Onset   Heart disease Mother    Thyroid disease Mother    High blood pressure Mother    Obesity Mother  COPD Father    Allergic rhinitis Neg Hx    Angioedema Neg Hx    Asthma Neg Hx    Eczema Neg Hx    Immunodeficiency Neg Hx    Urticaria Neg Hx    ROS: Review of Systems  Constitutional: Positive for malaise/fatigue.  Gastrointestinal: Negative for blood in stool, nausea and vomiting.  Musculoskeletal:       Negative for muscle weakness.  Neurological:       Negative for paresthesias.  Endo/Heme/Allergies:       Negative for polyphagia. Negative for hypoglycemia.   PHYSICAL EXAM: Blood pressure (!) 153/90, pulse 67, temperature 98.1 F (36.7 C), temperature source Oral, height 5\' 5"  (1.651 m), weight (!) 394 lb (178.7 kg), SpO2 97 %. Body mass index is 65.57 kg/m. Physical Exam Vitals signs reviewed.  Constitutional:      Appearance: Normal appearance. She is obese.  Cardiovascular:     Rate and Rhythm: Normal rate.     Pulses: Normal pulses.  Pulmonary:     Effort: Pulmonary effort is normal.     Breath sounds: Normal breath sounds.  Musculoskeletal: Normal range of motion.  Skin:    General: Skin is warm and dry.  Neurological:     Mental Status: She is alert and oriented to person, place, and time.  Psychiatric:         Behavior: Behavior normal.   RECENT LABS AND TESTS: BMET    Component Value Date/Time   NA 141 01/13/2019 1012   NA 142 08/31/2018 1256   K 4.6 01/13/2019 1012   CL 104 01/13/2019 1012   CO2 28 01/13/2019 1012   GLUCOSE 92 01/13/2019 1012   BUN 10 01/13/2019 1012   BUN 4 (L) 08/31/2018 1256   CREATININE 0.84 01/13/2019 1012   CALCIUM 9.3 01/13/2019 1012   GFRNONAA 86 01/13/2019 1012   GFRAA 99 01/13/2019 1012   Lab Results  Component Value Date   HGBA1C 5.8 (H) 02/03/2019   HGBA1C 5.7 (H) 08/31/2018   HGBA1C 5.7 (H) 03/02/2018   Lab Results  Component Value Date   INSULIN 12.7 02/03/2019   INSULIN 10.4 08/31/2018   CBC    Component Value Date/Time   WBC 3.6 02/03/2019 1146   WBC 3.6 (L) 05/27/2018 1144   RBC 4.17 02/03/2019 1146   RBC 5.14 (H) 05/27/2018 1144   HGB 10.4 (L) 02/03/2019 1146   HGB 10.4 11/21/2013   HCT 31.9 (L) 02/03/2019 1146   HCT 33 11/21/2013   PLT 326.0 05/27/2018 1144   PLT 312 05/05/2018 1009   PLT 306 11/21/2013   MCV 77 (L) 02/03/2019 1146   MCH 24.9 (L) 02/03/2019 1146   MCH 18.8 (L) 02/11/2018 0314   MCHC 32.6 02/03/2019 1146   MCHC 31.0 05/27/2018 1144   RDW 16.4 (H) 02/03/2019 1146   LYMPHSABS 1.5 02/03/2019 1146   MONOABS 0.5 05/27/2018 1144   EOSABS 0.1 02/03/2019 1146   BASOSABS 0.0 02/03/2019 1146   Iron/TIBC/Ferritin/ %Sat    Component Value Date/Time   IRON 34 02/03/2019 1146   TIBC 355 02/03/2019 1146   FERRITIN 23 02/03/2019 1146   IRONPCTSAT 10 (L) 02/03/2019 1146   Lipid Panel     Component Value Date/Time   CHOL 160 03/02/2018 0954   TRIG 75 03/02/2018 0954   HDL 48 03/02/2018 0954   CHOLHDL 3.3 03/02/2018 0954   CHOLHDL 3.8 Ratio 05/07/2009 2031   VLDL 14 05/07/2009 2031   LDLCALC 97 03/02/2018 0954  Hepatic Function Panel     Component Value Date/Time   PROT 7.1 01/13/2019 1012   PROT 7.2 08/31/2018 1256   ALBUMIN 4.1 08/31/2018 1256   AST 16 01/13/2019 1012   ALT 10 01/13/2019 1012   ALT 15  06/17/2018 0939   ALKPHOS 96 08/31/2018 1256   BILITOT 0.5 01/13/2019 1012   BILITOT 0.9 08/31/2018 1256      Component Value Date/Time   TSH 3.140 03/02/2018 0954   TSH 1.950 02/17/2018 1631   TSH 1.511 05/07/2009 2031   Results for CAELI, LINEHAN (MRN [id number]) as of 02/07/2019 10:12  Ref. Range 08/31/2018 12:56  Vitamin D, 25-Hydroxy Latest Ref Range: 30.0 - 100.0 ng/mL <4.0 (L)    OBESITY BEHAVIORAL INTERVENTION VISIT  Today's visit was #10    Starting weight: 403 lbs Starting date: 08/31/2018 Today's weight: 394 lbs  Today's date: 02/03/2019 Total lbs lost to date: 9  ASK: We discussed the diagnosis of obesity with [name] today and Riniyah agreed to give [place] permission to discuss obesity behavioral modification therapy today.  ASSESS: Khady has the diagnosis of obesity and her BMI today is 65.6. Quinlee is in the action stage of change.   ADVISE: Kierstan was educated on the multiple health risks of obesity as well as the benefit of weight loss to improve her health. She was advised of the need for long term treatment and the importance of lifestyle modifications to improve her current health and to decrease her risk of future health problems.  AGREE: Multiple dietary modification options and treatment options were discussed and  Loreen agreed to follow the recommendations documented in the above note.  ARRANGE: Mabelle was educated on the importance of frequent visits to treat obesity as outlined per CMS and USPSTF guidelines and agreed to schedule her next follow up appointment today.  IMichaelene Song, am acting as Location manager for [employer], FNP  I have reviewed the above documentation for accuracy and completeness, and I agree with the above.  - Dawn Whitmire, FNP-C.

## 2019-02-08 ENCOUNTER — Encounter: Payer: Self-pay | Admitting: Internal Medicine

## 2019-02-08 ENCOUNTER — Ambulatory Visit (INDEPENDENT_AMBULATORY_CARE_PROVIDER_SITE_OTHER): Payer: Managed Care, Other (non HMO) | Admitting: Internal Medicine

## 2019-02-08 ENCOUNTER — Other Ambulatory Visit: Payer: Self-pay

## 2019-02-08 VITALS — BP 186/111 | HR 80 | Temp 98.7°F | Wt 395.0 lb

## 2019-02-08 DIAGNOSIS — I10 Essential (primary) hypertension: Secondary | ICD-10-CM

## 2019-02-08 DIAGNOSIS — B181 Chronic viral hepatitis B without delta-agent: Secondary | ICD-10-CM | POA: Diagnosis not present

## 2019-02-08 NOTE — Assessment & Plan Note (Signed)
BP elevated today and she will discuss with her PCP

## 2019-02-08 NOTE — Assessment & Plan Note (Signed)
Doing well and no indication for treatment.  rtc 6 months.  Ultrasound for [facility] screening

## 2019-02-08 NOTE — Progress Notes (Signed)
   Subjective:    Patient ID: Ann Woods, female    DOB: 10-23-1975, 43 y.o.   MRN: [id number]  HPI Here for follow-up of hepatitis B. She has positive viral DNA first noted in 2015 and more recently noted to be in the thousands, at 6000 and 8000 most recently.  She has never been on treatment or had been evaluated.  She has no transaminitis and AST and ALT levels are both less than 20. 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  Interestingly, her fibro-sure fibrosis score is F0 with a 0 for inflammatory activity.  Her platelets are normal and albumin has remained normal.  The hepatitis Be antigen is negative and the antibody is positive.  Labs prior to this visit show a DNA level of 2,340 IU and normal LFTs.  Has not had an ultrasound recently.    Review of Systems  Constitutional: Negative for fever.  Gastrointestinal: Negative for diarrhea and nausea.  Skin: Negative for rash.       Objective:   Physical Exam Constitutional:      General: She is not in acute distress.    Appearance: She is well-developed.  Eyes:     General: No scleral icterus. Cardiovascular:     Rate and Rhythm: Normal rate and regular rhythm.     Heart sounds: Normal heart sounds.  Pulmonary:     Effort: Pulmonary effort is normal. No respiratory distress.     Breath sounds: Normal breath sounds.  Skin:    Findings: No rash.    SH: occasional alcohol       Assessment & Plan:

## 2019-02-18 ENCOUNTER — Ambulatory Visit
Admission: RE | Admit: 2019-02-18 | Discharge: 2019-02-18 | Disposition: A | Discharge status: Home / Self Care | Payer: Managed Care, Other (non HMO) | Source: Ambulatory Visit | Attending: Internal Medicine | Admitting: Internal Medicine

## 2019-02-18 DIAGNOSIS — B181 Chronic viral hepatitis B without delta-agent: Secondary | ICD-10-CM

## 2019-02-21 ENCOUNTER — Other Ambulatory Visit: Payer: Self-pay

## 2019-02-21 ENCOUNTER — Ambulatory Visit (INDEPENDENT_AMBULATORY_CARE_PROVIDER_SITE_OTHER): Payer: Managed Care, Other (non HMO) | Admitting: Family Medicine

## 2019-02-21 ENCOUNTER — Encounter (INDEPENDENT_AMBULATORY_CARE_PROVIDER_SITE_OTHER): Payer: Self-pay | Admitting: Family Medicine

## 2019-02-21 VITALS — BP 147/88 | HR 83 | Temp 97.6°F | Ht 65.0 in | Wt 396.0 lb

## 2019-02-21 DIAGNOSIS — Z9189 Other specified personal risk factors, not elsewhere classified: Secondary | ICD-10-CM | POA: Diagnosis not present

## 2019-02-21 DIAGNOSIS — Z6841 Body Mass Index (BMI) 40.0 and over, adult: Secondary | ICD-10-CM

## 2019-02-21 DIAGNOSIS — E538 Deficiency of other specified B group vitamins: Secondary | ICD-10-CM

## 2019-02-21 DIAGNOSIS — E559 Vitamin D deficiency, unspecified: Secondary | ICD-10-CM

## 2019-02-21 DIAGNOSIS — D508 Other iron deficiency anemias: Secondary | ICD-10-CM | POA: Diagnosis not present

## 2019-02-21 MED ORDER — VITAMIN D (ERGOCALCIFEROL) 1.25 MG (50000 UNIT) PO CAPS: 50000.0000 [IU] | ORAL_CAPSULE | ORAL | 0 refills | Status: DC

## 2019-02-21 NOTE — Progress Notes (Signed)
Office: [phone]  /  Fax: [phone]   HPI:   Chief Complaint: OBESITY Ann Woods is here to discuss her progress with her obesity treatment plan. She is on the protein rich vegetarian plan and is following her eating plan approximately 100 % of the time. She states she is walking 2 times per week. Ann Woods has been eating vegan. She feels that she eats protein at all meals. Ann Woods denies polyphagia. Ann Woods is vegan due to philosophical reasons and because she does not like meat much. Her weight is (!) 396 lb (179.6 kg) today and has had a weight loss of 1 pound over a period of 2 weeks since her last visit. She has lost 7 lbs since starting treatment with [place].  Vitamin D deficiency Ann Woods has a diagnosis of vitamin D deficiency. Her vitamin D level is nearly at goal, but is much improved. Her vitamin D level had been < 4. She is currently taking vit D and denies nausea, vomiting or muscle weakness.  At risk for osteopenia and osteoporosis Halle is at higher risk of osteopenia and osteoporosis due to vitamin D deficiency.   Iron Deficiency Anemia Ann Woods has a diagnosis of iron deficiency anemia. Ann Woods notes fatigue and she is on OTC iron supplementation every other day. Ann Woods denies palpitations. Most recent CBC shows HGB of 10.4 Lab Results  Component Value Date   WBC 3.6 02/03/2019   HGB 10.4 (L) 02/03/2019   HCT 31.9 (L) 02/03/2019   MCV 77 (L) 02/03/2019   PLT 326.0 05/27/2018    B12 Deficiency Ann Woods has a diagnosis of B12 insufficiency. Her B12 level is normal. She takes OTC B12 sporadically. She denies paresthesias.  ASSESSMENT AND PLAN:  Vitamin D deficiency - Plan: Vitamin D, Ergocalciferol, (DRISDOL) 1.25 MG (50000 UT) CAPS capsule  Other iron deficiency anemia  B12 deficiency  At risk for osteoporosis  Class 3 severe obesity with serious comorbidity and body mass index (BMI) of 60.0 to 69.9 in adult, unspecified obesity type ([place])  PLAN:  Vitamin  D Deficiency [name] was informed that low vitamin D levels contributes to fatigue and are associated with obesity, breast, and colon cancer. She agrees to continue to take prescription Vit D @50 ,000 IU every week #4 with no refills and will follow up for routine testing of vitamin D, at least 2-3 times per year. She was informed of the risk of over-replacement of vitamin D and agrees to not increase her dose unless she discusses this with [place] first. Ann Woods agrees to follow up as directed.  At risk for osteopenia and osteoporosis Ann Woods was given extended  (15 minutes) osteoporosis prevention counseling today. Ann Woods is at risk for osteopenia and osteoporosis due to her vitamin D deficiency. She was encouraged to take her vitamin D and follow her higher calcium diet and increase strengthening exercise to help strengthen her bones and decrease her risk of osteopenia and osteoporosis.  Iron Deficiency Anemia The diagnosis of Iron deficiency anemia was discussed with Ann Woods and was explained in detail. Ann Woods will  take OTC iron daily rather than QOD.  B12 Deficiency Ann Woods will work on increasing B12 rich foods in her diet. She will be more compliant with OTC B12 1,000 mcg daily.   Obesity Ann Woods is currently in the action stage of change. As such, her goal is to continue with weight loss efforts She has agreed to keep a food journal with 1800 to 1900 calories and 100 to 110 grams of protein daily Ann Woods has been  instructed to work up to a goal of 150 minutes of combined cardio and strengthening exercise per week for weight loss and overall health benefits. We discussed the following Behavioral Modification Strategies today: planning for success, keep a strict food journal, increasing lean protein intake and decreasing simple carbohydrates   Handouts for journaling, eating out and protein content, were provided to patient today.  Ann Woods has agreed to follow up with our clinic in 3 weeks.  She was informed of the importance of frequent follow up visits to maximize her success with intensive lifestyle modifications for her multiple health conditions.  ALLERGIES: No Known Allergies  MEDICATIONS: Current Outpatient Medications on File Prior to Visit  Medication Sig Dispense Refill  . albuterol (VENTOLIN HFA) 108 (90 Base) MCG/ACT inhaler Inhale 2 puffs into the lungs every 6 (six) hours as needed for wheezing or shortness of breath.    Ann Woods amLODipine (NORVASC) 5 MG tablet Take 1 tablet (5 mg total) by mouth daily. 30 tablet 0  . azelastine (ASTELIN) 0.1 % nasal spray Use 1 spray in each nostril twice daily 30 mL 5  . azelastine (OPTIVAR) 0.05 % ophthalmic solution Use 1 drop in each eye twice daily as needed 6 mL 5  . cetirizine (ZYRTEC) 10 MG tablet Take 10 mg by mouth daily.    . ferrous sulfate 325 (65 FE) MG tablet Take 1 tablet (325 mg total) by mouth daily with breakfast. 30 tablet 0  . [name] (CYANOCOBALAMIN) 500 MCG tablet Take 500 mcg by mouth daily.    Ann Woods zinc gluconate 50 MG tablet Take 50 mg by mouth daily.     [name].     PAST MEDICAL HISTORY: Past Medical History:  Diagnosis Date  . Anemia   . Hepatitis B   . High cholesterol   . Hypertension    G2 - preeclampsia, CHTN  . Infertility, female   . Lactose intolerance   . Obesity   . Obesity   . Prediabetes 04/01/2018  . Seasonal allergies   . Shortness of breath   . Swelling of both lower extremities   . Urticaria     PAST SURGICAL HISTORY: Past Surgical History:  Procedure Laterality Date  . CESAREAN SECTION    . HIP SURGERY    . TUBAL LIGATION Bilateral 05/05/2014   Procedure: POST PARTUM TUBAL LIGATION;  Surgeon: Donnamae Jude, MD;  Location: [facility];  Service: Gynecology;  Laterality: Bilateral;    SOCIAL HISTORY: Social History   Tobacco Use  . Smoking status: Former Smoker    Years: 0.50    Types: Cigarettes    Quit date:  08/25/1993    Years since quitting: 25.5  . Smokeless tobacco: Never Used  . Tobacco comment: one pack of cigarettes weekly- 05/27/18  Substance Use Topics  . Alcohol use: Not Currently  . Drug use: No    FAMILY HISTORY: Family History  Problem Relation Age of Onset  . Heart disease Mother   . Thyroid disease Mother   . High blood pressure Mother   . Obesity Mother   . COPD Father   . Allergic rhinitis Neg Hx   . Angioedema Neg Hx   . Asthma Neg Hx   . Eczema Neg Hx   . Immunodeficiency Neg Hx   . Urticaria Neg Hx     ROS: Review of Systems  Constitutional: Positive for malaise/fatigue and weight loss.  Cardiovascular: Negative for palpitations.  Gastrointestinal:  Negative for nausea and vomiting.  Musculoskeletal:       Negative for muscle weakness  Neurological: Negative for tingling.    PHYSICAL EXAM: Blood pressure (!) 147/88, pulse 83, temperature 97.6 F (36.4 C), height 5\' 5"  (1.651 m), weight (!) 396 lb (179.6 kg), SpO2 97 %. Body mass index is 65.9 kg/m. Physical Exam Vitals signs reviewed.  Constitutional:      Appearance: Normal appearance. She is well-developed. She is obese.  Cardiovascular:     Rate and Rhythm: Normal rate.  Pulmonary:     Effort: Pulmonary effort is normal.  Musculoskeletal: Normal range of motion.  Skin:    General: Skin is warm and dry.  Neurological:     Mental Status: She is alert and oriented to person, place, and time.  Psychiatric:        Mood and Affect: Mood normal.        Behavior: Behavior normal.     RECENT LABS AND TESTS: BMET    Component Value Date/Time   NA 141 01/13/2019 1012   NA 142 08/31/2018 1256   K 4.6 01/13/2019 1012   CL 104 01/13/2019 1012   CO2 28 01/13/2019 1012   GLUCOSE 92 01/13/2019 1012   BUN 10 01/13/2019 1012   BUN 4 (L) 08/31/2018 1256   CREATININE 0.84 01/13/2019 1012   CALCIUM 9.3 01/13/2019 1012   GFRNONAA 86 01/13/2019 1012   GFRAA 99 01/13/2019 1012   Lab Results   Component Value Date   HGBA1C 5.8 (H) 02/03/2019   HGBA1C 5.7 (H) 08/31/2018   HGBA1C 5.7 (H) 03/02/2018   Lab Results  Component Value Date   INSULIN 12.7 02/03/2019   INSULIN 10.4 08/31/2018   CBC    Component Value Date/Time   WBC 3.6 02/03/2019 1146   WBC 3.6 (L) 05/27/2018 1144   RBC 4.17 02/03/2019 1146   RBC 5.14 (H) 05/27/2018 1144   HGB 10.4 (L) 02/03/2019 1146   HGB 10.4 11/21/2013   HCT 31.9 (L) 02/03/2019 1146   HCT 33 11/21/2013   PLT 326.0 05/27/2018 1144   PLT 312 05/05/2018 1009   PLT 306 11/21/2013   MCV 77 (L) 02/03/2019 1146   MCH 24.9 (L) 02/03/2019 1146   MCH 18.8 (L) 02/11/2018 0314   MCHC 32.6 02/03/2019 1146   MCHC 31.0 05/27/2018 1144   RDW 16.4 (H) 02/03/2019 1146   LYMPHSABS 1.5 02/03/2019 1146   MONOABS 0.5 05/27/2018 1144   EOSABS 0.1 02/03/2019 1146   BASOSABS 0.0 02/03/2019 1146   Iron/TIBC/Ferritin/ %Sat    Component Value Date/Time   IRON 34 02/03/2019 1146   TIBC 355 02/03/2019 1146   FERRITIN 23 02/03/2019 1146   IRONPCTSAT 10 (L) 02/03/2019 1146   Lipid Panel     Component Value Date/Time   CHOL 160 03/02/2018 0954   TRIG 75 03/02/2018 0954   HDL 48 03/02/2018 0954   CHOLHDL 3.3 03/02/2018 0954   CHOLHDL 3.8 Ratio 05/07/2009 2031   VLDL 14 05/07/2009 2031   LDLCALC 97 03/02/2018 0954   Hepatic Function Panel     Component Value Date/Time   PROT 7.1 01/13/2019 1012   PROT 7.2 08/31/2018 1256   ALBUMIN 4.1 08/31/2018 1256   AST 16 01/13/2019 1012   ALT 10 01/13/2019 1012   ALT 15 06/17/2018 0939   ALKPHOS 96 08/31/2018 1256   BILITOT 0.5 01/13/2019 1012   BILITOT 0.9 08/31/2018 1256      Component Value Date/Time   TSH 3.140  03/02/2018 0954   TSH 1.950 02/17/2018 1631   TSH 1.511 05/07/2009 2031    Results for AVALEY, COOP (MRN [id number]) as of 02/21/2019 15:52  Ref. Range 02/03/2019 11:46  Vitamin D, 25-Hydroxy Latest Ref Range: 30.0 - 100.0 ng/mL 41.7    OBESITY BEHAVIORAL INTERVENTION VISIT   Today's visit was # 11   Starting weight: 403 lbs Starting date: 08/31/2018 Today's weight : 396 lbs Today's date: 02/21/2019 Total lbs lost to date: 7    02/21/2019  Height 5\' 5"  (1.651 m)  Weight 396 lb (179.6 kg) (A)  BMI (Calculated) 65.9  BLOOD PRESSURE - SYSTOLIC 010  BLOOD PRESSURE - DIASTOLIC 88   Body Fat % 07.1 %    ASK: We discussed the diagnosis of obesity with [name] today and Nikisha agreed to give [place] permission to discuss obesity behavioral modification therapy today.  ASSESS: Lewanna has the diagnosis of obesity and her BMI today is 65.9 Trenise is in the action stage of change   ADVISE: Goddess was educated on the multiple health risks of obesity as well as the benefit of weight loss to improve her health. She was advised of the need for long term treatment and the importance of lifestyle modifications to improve her current health and to decrease her risk of future health problems.  AGREE: Multiple dietary modification options and treatment options were discussed and  Louisa agreed to follow the recommendations documented in the above note.  ARRANGE: Dalton was educated on the importance of frequent visits to treat obesity as outlined per CMS and USPSTF guidelines and agreed to schedule her next follow up appointment today.  I, Doreene Nest, am acting as transcriptionist for [employer], FNP-C  I have reviewed the above documentation for accuracy and completeness, and I agree with the above.  - Winry Egnew, FNP-C.

## 2019-02-22 ENCOUNTER — Encounter (INDEPENDENT_AMBULATORY_CARE_PROVIDER_SITE_OTHER): Payer: Self-pay | Admitting: Family Medicine

## 2019-03-10 ENCOUNTER — Encounter: Payer: Self-pay | Admitting: Allergy

## 2019-03-10 ENCOUNTER — Other Ambulatory Visit: Payer: Self-pay

## 2019-03-10 ENCOUNTER — Ambulatory Visit (INDEPENDENT_AMBULATORY_CARE_PROVIDER_SITE_OTHER): Payer: Managed Care, Other (non HMO) | Admitting: Allergy

## 2019-03-10 VITALS — BP 138/88 | HR 92 | Temp 98.0°F | Resp 20

## 2019-03-10 DIAGNOSIS — T781XXD Other adverse food reactions, not elsewhere classified, subsequent encounter: Secondary | ICD-10-CM

## 2019-03-10 DIAGNOSIS — J3089 Other allergic rhinitis: Secondary | ICD-10-CM | POA: Diagnosis not present

## 2019-03-10 DIAGNOSIS — H1013 Acute atopic conjunctivitis, bilateral: Secondary | ICD-10-CM

## 2019-03-10 MED ORDER — PAZEO 0.7 % OP SOLN: 1.0000 [drp] | Freq: Every day | OPHTHALMIC | 5 refills | Status: DC

## 2019-03-10 MED ORDER — MONTELUKAST SODIUM 10 MG PO TABS: 10.0000 mg | ORAL_TABLET | Freq: Every day | ORAL | 5 refills | Status: DC

## 2019-03-10 NOTE — Patient Instructions (Addendum)
Allergic rhinitis with conjunctivitis  - continue avoidance measures for grass pollens, tree pollens, weed pollens, molds, dust mites, cat, dog, mixed feathers (ie. Down), cockroach, mouse  - for general allergy symptom control continue Xyzal 5mg  or Zyrtec 10mg  daily as needed.    - for itchy, watery, red eyes use Pazeo eye drop 1 drop each daily as needed  - for nasal congestion continue use of Flonase 2 sprays each nostril daily for 1-2 weeks at a time before stopping once improved  - for nasal drainage/post-nasal drip and throat clearing continue use of nasal antihistamine, Astelin 2 sprays each nostril twice a day as needed  - start Singulair 10mg  daily - take at bedtime  - allergen immunotherapy discussed today including protocol, benefits and risk.  Informational handout provided.  If interested in this therapuetic option you can check with your insurance carrier for coverage.  Let us know if you would like to proceed with this option.    - recommend wearing a mask with seal (like a N95 mask) while performing yard-work or cutting the grass to decrease inhalation of the grass pollens.    Adverse food reaction   - throat itch/clearing after shrimp ingestion  - skin testing to shellfish panel is negative  Follow-up 4-6 months or sooner if needed

## 2019-03-10 NOTE — Progress Notes (Signed)
Follow-up Note  RE: Ann Woods MRN: [id number] DOB: 02-08-1976 Date of Office Visit: 03/10/2019   History of present illness: Ann Woods is a 43 y.o. female presenting today for follow-up of allergic rhinitis with conjunctivitis and adverse food reaction.  She was last seen in the office on Jan 21, 2019 by myself. She states that her eyes are still very itchy and watery and that the azelastine does not really help.  She does states she alternates every day between using Xyzal and Zyrtec and this seems to help control her general allergy symptoms.  She also has Flonase and Astelin to use for nasal symptoms but states that she has not needed to use these this much.  Her eyes are her biggest concern at this point. She does try to avoid shrimp as this does lead to throat itch and clearing of her throat.  Review of systems: Review of Systems  Constitutional: Negative for chills, fever and malaise/fatigue.  HENT: Negative for congestion, ear discharge, nosebleeds, sinus pain and sore throat.   Eyes: Negative for pain, discharge and redness.  Respiratory: Negative for cough, shortness of breath and wheezing.   Cardiovascular: Negative for chest pain.  Gastrointestinal: Negative for abdominal pain, constipation, diarrhea, heartburn, nausea and vomiting.  Musculoskeletal: Negative for joint pain.  Skin: Negative for itching and rash.  Neurological: Negative for headaches.    All other systems negative unless noted above in HPI  Past medical/social/surgical/family history have been reviewed and are unchanged unless specifically indicated below.  No changes  Medication List: Allergies as of 03/10/2019   No Known Allergies     Medication List       Accurate as of March 10, 2019  6:26 PM. If you have any questions, ask your nurse or doctor.        albuterol 108 (90 Base) MCG/ACT inhaler Commonly known as: VENTOLIN HFA Inhale 2 puffs into the lungs every 6 (six) hours as  needed for wheezing or shortness of breath.   amLODipine 5 MG tablet Commonly known as: NORVASC Take 1 tablet (5 mg total) by mouth daily.   azelastine 0.05 % ophthalmic solution Commonly known as: OPTIVAR Use 1 drop in each eye twice daily as needed   azelastine 0.1 % nasal spray Commonly known as: ASTELIN Use 1 spray in each nostril twice daily   cetirizine 10 MG tablet Commonly known as: ZYRTEC Take 10 mg by mouth daily.   ferrous sulfate 325 (65 FE) MG tablet Take 1 tablet (325 mg total) by mouth daily with breakfast.   levocetirizine 5 MG tablet Commonly known as: XYZAL Take 5 mg by mouth every evening.   montelukast 10 MG tablet Commonly known as: Singulair Take 1 tablet (10 mg total) by mouth at bedtime. Started by: Emmily Pellegrin Charmian Muff, MD   Pazeo 0.7 % Soln Generic drug: Olopatadine HCl Place 1 drop into both eyes daily. Started by: Blanco, MD   [name] 500 MCG tablet Commonly known as: CYANOCOBALAMIN Take 500 mcg by mouth daily.   Vitamin D (Ergocalciferol) 1.25 MG (50000 UT) Caps capsule Commonly known as: DRISDOL Take 1 capsule (50,000 Units total) by mouth every 7 (seven) days.   zinc gluconate 50 MG tablet Take 50 mg by mouth daily.       Known medication allergies: No Known Allergies   Physical examination: Blood pressure 138/88, pulse 92, temperature 98 F (36.7 C), resp. rate 20, SpO2 97 %.  General: Alert, interactive,  in no acute distress, obese. HEENT: PERRLA, TMs pearly gray, turbinates minimally edematous without discharge, post-pharynx non erythematous. Neck: Supple without lymphadenopathy. Lungs: Clear to auscultation without wheezing, rhonchi or rales. {no increased work of breathing. CV: Normal S1, S2 without murmurs. Abdomen: Nondistended, nontender. Skin: Warm and dry, without lesions or rashes. Extremities:  No clubbing, cyanosis or edema. Neuro:   Grossly intact.  Diagnositics/Labs: None today   Assessment and plan:   Allergic rhinitis with conjunctivitis  - continue avoidance measures for grass pollens, tree pollens, weed pollens, molds, dust mites, cat, dog, mixed feathers (ie. Down), cockroach, mouse  - for general allergy symptom control continue Xyzal 5mg  or Zyrtec 10mg  daily as needed.    - for itchy, watery, red eyes use Pazeo eye drop 1 drop each daily as needed  - for nasal congestion continue use of Flonase 2 sprays each nostril daily for 1-2 weeks at a time before stopping once improved  - for nasal drainage/post-nasal drip and throat clearing continue use of nasal antihistamine, Astelin 2 sprays each nostril twice a day as needed  - start Singulair 10mg  daily - take at bedtime  - allergen immunotherapy discussed today including protocol, benefits and risk.  Informational handout provided.  If interested in this therapuetic option you can check with your insurance carrier for coverage.  Let us know if you would like to proceed with this option.    - recommend wearing a mask with seal (like a N95 mask) while performing yard-work or cutting the grass to decrease inhalation of the grass pollens.    Adverse food reaction   - throat itch/clearing after shrimp ingestion  - skin testing to shellfish panel is negative  Follow-up 4-6 months or sooner if needed   I appreciate the opportunity to take part in [place] care. Please do not hesitate to contact me with questions.  Sincerely,   Prudy Feeler, MD Allergy/Immunology [facility] [place]

## 2019-03-14 ENCOUNTER — Ambulatory Visit (INDEPENDENT_AMBULATORY_CARE_PROVIDER_SITE_OTHER): Payer: Managed Care, Other (non HMO) | Admitting: Bariatrics

## 2019-03-14 ENCOUNTER — Other Ambulatory Visit: Payer: Self-pay

## 2019-03-14 ENCOUNTER — Encounter (INDEPENDENT_AMBULATORY_CARE_PROVIDER_SITE_OTHER): Payer: Self-pay | Admitting: Bariatrics

## 2019-03-14 VITALS — BP 121/81 | HR 83 | Temp 98.6°F | Ht 65.0 in | Wt >= 6400 oz

## 2019-03-14 DIAGNOSIS — Z6841 Body Mass Index (BMI) 40.0 and over, adult: Secondary | ICD-10-CM

## 2019-03-14 DIAGNOSIS — Z9189 Other specified personal risk factors, not elsewhere classified: Secondary | ICD-10-CM

## 2019-03-14 DIAGNOSIS — R7303 Prediabetes: Secondary | ICD-10-CM | POA: Diagnosis not present

## 2019-03-14 DIAGNOSIS — E559 Vitamin D deficiency, unspecified: Secondary | ICD-10-CM

## 2019-03-14 MED ORDER — VITAMIN D (ERGOCALCIFEROL) 1.25 MG (50000 UNIT) PO CAPS: 50000.0000 [IU] | ORAL_CAPSULE | ORAL | 0 refills | Status: DC

## 2019-03-16 NOTE — Progress Notes (Signed)
Office: [phone]  /  Fax: [phone]   HPI:   Chief Complaint: OBESITY Ann Woods is here to discuss her progress with her obesity treatment plan. She is on the keep a food journal with 1800 to 1900 calories and 100 to 110 grams of protein daily plan and she is following her eating plan approximately 100 % of the time. She states she is walking 20 minutes 2 times per week. Ann Woods denies any struggles. She thinks she has increased water weight. Her weight is (!) 401 lb (181.9 kg) today and has had a weight gain of 5 pounds over a period of 3 weeks since her last visit. She has lost 2 lbs since starting treatment with [place].  Vitamin D deficiency Ann Woods has a diagnosis of vitamin D deficiency. Her last vitamin D level was at 41.7 Ann Woods is currently taking vit D and she denies nausea, vomiting or muscle weakness.  Pre-Diabetes Ann Woods has a diagnosis of prediabetes based on her elevated Hgb A1c and was informed this puts her at greater risk of developing diabetes. Her last A1c was at 5.8 She is not taking metformin currently and continues to work on diet and exercise to decrease risk of diabetes. She denies polyphagia  At risk for diabetes Ann Woods is at higher than average risk for developing diabetes due to her obesity and prediabetes. She currently denies polyuria or polydipsia.  ASSESSMENT AND PLAN:  Vitamin D deficiency - Plan: Vitamin D, Ergocalciferol, (DRISDOL) 1.25 MG (50000 UT) CAPS capsule  Prediabetes  At risk for diabetes mellitus  Class 3 severe obesity with serious comorbidity and body mass index (BMI) of 60.0 to 69.9 in adult, unspecified obesity type (Ann Woods)  PLAN:  Vitamin D Deficiency Ann Woods was informed that low vitamin D levels contributes to fatigue and are associated with obesity, breast, and colon cancer. She agrees to continue to take prescription Vit D @50 ,000 IU every week #4 with no refills and will follow up for routine testing of vitamin D, at least  2-3 times per year. She was informed of the risk of over-replacement of vitamin D and agrees to not increase her dose unless she discusses this with [place] first. Ann Woods agrees to follow up with our clinic in 2 weeks.  Pre-Diabetes Ann Woods will continue to work on weight loss, increasing activity, increasing lean protein and decreasing simple carbohydrates in her diet to help decrease the risk of diabetes. She was informed that eating too many simple carbohydrates or too many calories at one sitting increases the likelihood of GI side effects. Ann Woods agreed to follow up with [place] as directed to monitor her progress.  Diabetes risk counseling Ann Woods was given extended (15 minutes) diabetes prevention counseling today. She is 43 y.o. female and has risk factors for diabetes including obesity and prediabetes. We discussed intensive lifestyle modifications today with an emphasis on weight loss as well as increasing exercise and decreasing simple carbohydrates in her diet.  Obesity Ann Woods is currently in the action stage of change. As such, her goal is to continue with weight loss efforts She has agreed to keep a food journal with 1800 to 1900 calories and 100 to 110 grams of protein daily and follow our protein rich vegan plan Ann Woods will continue her exercise regimen for weight loss and overall health benefits. We discussed the following Behavioral Modification Strategies today: increase H2O intake, no skipping meals, keeping healthy foods in the home, increasing lean protein intake, decreasing simple carbohydrates, increasing vegetables, decrease eating out and work  on meal planning and intentional eating Ann Woods will use "MyFitnessPal" (pure protein, plant based meat).  Ann Woods has agreed to follow up with our clinic in 2 weeks. She was informed of the importance of frequent follow up visits to maximize her success with intensive lifestyle modifications for her multiple health conditions.  ALLERGIES:  No Known Allergies  MEDICATIONS: Current Outpatient Medications on File Prior to Visit  Medication Sig Dispense Refill  . albuterol (VENTOLIN HFA) 108 (90 Base) MCG/ACT inhaler Inhale 2 puffs into the lungs every 6 (six) hours as needed for wheezing or shortness of breath.    [name] amLODipine (NORVASC) 5 MG tablet Take 1 tablet (5 mg total) by mouth daily. 30 tablet 0  . azelastine (ASTELIN) 0.1 % nasal spray Use 1 spray in each nostril twice daily 30 mL 5  . azelastine (OPTIVAR) 0.05 % ophthalmic solution Use 1 drop in each eye twice daily as needed 6 mL 5  . cetirizine (ZYRTEC) 10 MG tablet Take 10 mg by mouth daily.    [name] levocetirizine (XYZAL) 5 MG tablet Take 5 mg by mouth every evening.    . montelukast (SINGULAIR) 10 MG tablet Take 1 tablet (10 mg total) by mouth at bedtime. 30 tablet 5  . Olopatadine HCl (PAZEO) 0.7 % SOLN Place 1 drop into both eyes daily. 2.5 mL 5  . [name] (CYANOCOBALAMIN) 500 MCG tablet Take 500 mcg by mouth daily.    [name] zinc gluconate 50 MG tablet Take 50 mg by mouth daily.    . ferrous sulfate 325 (65 FE) MG tablet Take 1 tablet (325 mg total) by mouth daily with breakfast. 30 tablet 0   [name].     PAST MEDICAL HISTORY: Past Medical History:  Diagnosis Date  . Anemia   . Hepatitis B   . High cholesterol   . Hypertension    G2 - preeclampsia, CHTN  . Infertility, female   . Lactose intolerance   . Obesity   . Obesity   . Prediabetes 04/01/2018  . Seasonal allergies   . Shortness of breath   . Swelling of both lower extremities   . Urticaria     PAST SURGICAL HISTORY: Past Surgical History:  Procedure Laterality Date  . CESAREAN SECTION    . HIP SURGERY    . TUBAL LIGATION Bilateral 05/05/2014   Procedure: POST PARTUM TUBAL LIGATION;  Surgeon: Donnamae Jude, MD;  Location: [facility];  Service: Gynecology;  Laterality: Bilateral;    SOCIAL HISTORY: Social History   Tobacco Use  .  Smoking status: Former Smoker    Years: 0.50    Types: Cigarettes    Quit date: 08/25/1993    Years since quitting: 25.5  . Smokeless tobacco: Never Used  . Tobacco comment: one pack of cigarettes weekly- 05/27/18  Substance Use Topics  . Alcohol use: Not Currently  . Drug use: No    FAMILY HISTORY: Family History  Problem Relation Age of Onset  . Heart disease Mother   . Thyroid disease Mother   . High blood pressure Mother   . Obesity Mother   . COPD Father   . Allergic rhinitis Neg Hx   . Angioedema Neg Hx   . Asthma Neg Hx   . Eczema Neg Hx   . Immunodeficiency Neg Hx   . Urticaria Neg Hx     ROS: Review of Systems  Constitutional: Negative for weight loss.  Gastrointestinal: Negative for  nausea and vomiting.  Genitourinary: Negative for frequency.  Musculoskeletal:       Negative for muscle weakness  Endo/Heme/Allergies: Negative for polydipsia.       Negative for polyphagia    PHYSICAL EXAM: Blood pressure 121/81, pulse 83, temperature 98.6 F (37 C), temperature source Oral, height 5\' 5"  (1.651 m), weight (!) 401 lb (181.9 kg), last menstrual period 02/07/2019, SpO2 97 %. Body mass index is 66.73 kg/m. Physical Exam Vitals signs reviewed.  Constitutional:      Appearance: Normal appearance. She is well-developed. She is obese.  Cardiovascular:     Rate and Rhythm: Normal rate.  Pulmonary:     Effort: Pulmonary effort is normal.  Musculoskeletal: Normal range of motion.  Skin:    General: Skin is warm and dry.  Neurological:     Mental Status: She is alert and oriented to person, place, and time.  Psychiatric:        Mood and Affect: Mood normal.        Behavior: Behavior normal.     RECENT LABS AND TESTS: BMET    Component Value Date/Time   NA 141 01/13/2019 1012   NA 142 08/31/2018 1256   K 4.6 01/13/2019 1012   CL 104 01/13/2019 1012   CO2 28 01/13/2019 1012   GLUCOSE 92 01/13/2019 1012   BUN 10 01/13/2019 1012   BUN 4 (L) 08/31/2018  1256   CREATININE 0.84 01/13/2019 1012   CALCIUM 9.3 01/13/2019 1012   GFRNONAA 86 01/13/2019 1012   GFRAA 99 01/13/2019 1012   Lab Results  Component Value Date   HGBA1C 5.8 (H) 02/03/2019   HGBA1C 5.7 (H) 08/31/2018   HGBA1C 5.7 (H) 03/02/2018   Lab Results  Component Value Date   INSULIN 12.7 02/03/2019   INSULIN 10.4 08/31/2018   CBC    Component Value Date/Time   WBC 3.6 02/03/2019 1146   WBC 3.6 (L) 05/27/2018 1144   RBC 4.17 02/03/2019 1146   RBC 5.14 (H) 05/27/2018 1144   HGB 10.4 (L) 02/03/2019 1146   HGB 10.4 11/21/2013   HCT 31.9 (L) 02/03/2019 1146   HCT 33 11/21/2013   PLT 326.0 05/27/2018 1144   PLT 312 05/05/2018 1009   PLT 306 11/21/2013   MCV 77 (L) 02/03/2019 1146   MCH 24.9 (L) 02/03/2019 1146   MCH 18.8 (L) 02/11/2018 0314   MCHC 32.6 02/03/2019 1146   MCHC 31.0 05/27/2018 1144   RDW 16.4 (H) 02/03/2019 1146   LYMPHSABS 1.5 02/03/2019 1146   MONOABS 0.5 05/27/2018 1144   EOSABS 0.1 02/03/2019 1146   BASOSABS 0.0 02/03/2019 1146   Iron/TIBC/Ferritin/ %Sat    Component Value Date/Time   IRON 34 02/03/2019 1146   TIBC 355 02/03/2019 1146   FERRITIN 23 02/03/2019 1146   IRONPCTSAT 10 (L) 02/03/2019 1146   Lipid Panel     Component Value Date/Time   CHOL 160 03/02/2018 0954   TRIG 75 03/02/2018 0954   HDL 48 03/02/2018 0954   CHOLHDL 3.3 03/02/2018 0954   CHOLHDL 3.8 Ratio 05/07/2009 2031   VLDL 14 05/07/2009 2031   LDLCALC 97 03/02/2018 0954   Hepatic Function Panel     Component Value Date/Time   PROT 7.1 01/13/2019 1012   PROT 7.2 08/31/2018 1256   ALBUMIN 4.1 08/31/2018 1256   AST 16 01/13/2019 1012   ALT 10 01/13/2019 1012   ALT 15 06/17/2018 0939   ALKPHOS 96 08/31/2018 1256   BILITOT 0.5 01/13/2019 1012  BILITOT 0.9 08/31/2018 1256      Component Value Date/Time   TSH 3.140 03/02/2018 0954   TSH 1.950 02/17/2018 1631   TSH 1.511 05/07/2009 2031     Ref. Range 02/03/2019 11:46  Vitamin D, 25-Hydroxy Latest Ref  Range: 30.0 - 100.0 ng/mL 41.7    OBESITY BEHAVIORAL INTERVENTION VISIT  Today's visit was # 12   Starting weight: 403 lbs Starting date: 08/31/2018 Today's weight : 401 lbs  Today's date: 03/14/2019 Total lbs lost to date: 2    03/14/2019  Height 5\' 5"  (1.651 m)  Weight 401 lb (181.9 kg) (A)  BMI (Calculated) 66.73  BLOOD PRESSURE - SYSTOLIC 623  BLOOD PRESSURE - DIASTOLIC 81   Body Fat % 76.2 %    ASK: We discussed the diagnosis of obesity with Ann Woods today and Ann Woods agreed to give [place] permission to discuss obesity behavioral modification therapy today.  ASSESS: Ann Woods has the diagnosis of obesity and her BMI today is 66.73 Ann Woods is in the action stage of change   ADVISE: Ann Woods was educated on the multiple health risks of obesity as well as the benefit of weight loss to improve her health. She was advised of the need for long term treatment and the importance of lifestyle modifications to improve her current health and to decrease her risk of future health problems.  AGREE: Multiple dietary modification options and treatment options were discussed and  Ann Woods agreed to follow the recommendations documented in the above note.  ARRANGE: Ann Woods was educated on the importance of frequent visits to treat obesity as outlined per CMS and USPSTF guidelines and agreed to schedule her next follow up appointment today.  Corey Skains, am acting as Location manager for [employer]. Owens Shark, DO  I have reviewed the above documentation for accuracy and completeness, and I agree with the above. -Jearld Lesch, DO

## 2019-03-30 ENCOUNTER — Other Ambulatory Visit: Payer: Self-pay

## 2019-03-30 ENCOUNTER — Encounter (INDEPENDENT_AMBULATORY_CARE_PROVIDER_SITE_OTHER): Payer: Self-pay | Admitting: Bariatrics

## 2019-03-30 ENCOUNTER — Ambulatory Visit (INDEPENDENT_AMBULATORY_CARE_PROVIDER_SITE_OTHER): Payer: Managed Care, Other (non HMO) | Admitting: Bariatrics

## 2019-03-30 DIAGNOSIS — E559 Vitamin D deficiency, unspecified: Secondary | ICD-10-CM

## 2019-03-30 DIAGNOSIS — Z6841 Body Mass Index (BMI) 40.0 and over, adult: Secondary | ICD-10-CM

## 2019-03-30 DIAGNOSIS — R7303 Prediabetes: Secondary | ICD-10-CM | POA: Diagnosis not present

## 2019-03-31 NOTE — Progress Notes (Addendum)
Office: [phone]  /  Fax: [phone] TeleHealth Visit:  Ann Woods has verbally consented to this TeleHealth visit today. The patient is located at home, the provider is located at the [facility] office. The participants in this visit include the listed provider and patient and any and all parties involved. The visit was conducted today via telephone ( 15 minutes ).  Cynitha was unable to use realtime audiovisual technology today and the telehealth visit was conducted via telephone.  HPI:   Chief Complaint: OBESITY Ann Woods is here to discuss her progress with her obesity treatment plan. She is journaling a vegan plan through My[employer]itnessPal and she is following her eating plan approximately 100 % of the time. She states she is walking 20 minutes 2 times per week. Ann Woods states that her weight is the same. She denies any struggles. Ann Woods is doing well with her protein and she is doing well with water. We were unable to weigh the patient today for this TeleHealth visit. She feels as if she has maintained weight (weight 401 lbs 03/30/19) since her last visit. She has lost 2 lbs since starting treatment with [place].  Vitamin D deficiency Ann Woods has a diagnosis of vitamin D deficiency. Her last vitamin D level was at 41.9 She is currently taking vit D and denies nausea, vomiting or muscle weakness.  Pre-Diabetes Ann Woods has a diagnosis of prediabetes based on her elevated Hgb A1c and was informed this puts her at greater risk of developing diabetes. Her last A1c was at 5.8 Ann Woods is not on medications and she continues to work on diet and exercise to decrease risk of diabetes. She denies nausea or hypoglycemia.   ASSESSMENT AND PLAN:  Vitamin D deficiency  Prediabetes  BMI 60.0-69.9, adult ([place])  PLAN:  Vitamin D Deficiency Taytum was informed that low vitamin D levels contributes to fatigue and are associated with obesity, breast, and colon cancer. Hennessy will  continue to take prescription Vit D @50 ,000 IU every week and she will follow up for routine testing of vitamin D, at least 2-3 times per year. She was informed of the risk of over-replacement of vitamin D and agrees to not increase her dose unless she discusses this with [place] first.  Pre-Diabetes Ann Woods will continue to work on weight loss, exercise, increasing lean protein and decreasing simple carbohydrates in her diet to help decrease the risk of diabetes.  She was informed that eating too many simple carbohydrates or too many calories at one sitting increases the likelihood of GI side effects. Delpha agreed to follow up with [place] as directed to monitor her progress.  Obesity Ann Woods is currently in the action stage of change. As such, her goal is to continue with weight loss efforts She has agreed to follow MyFitnessPal (journaling vegan) Ann Woods has been instructed to work up to a goal of 150 minutes of combined cardio and strengthening exercise per week for weight loss and overall health benefits. We discussed the following Behavioral Modification Strategies today: planning for success, increase H2O intake, no skipping meals, keeping healthy foods in the home, increasing lean protein intake, decreasing simple carbohydrates, increasing vegetables, decrease eating out and work on meal planning and intentional eating Ann Woods will continue to journal.  Ann Woods has agreed to follow up with our clinic in 2 weeks. She was informed of the importance of frequent follow up visits to maximize her success with intensive lifestyle modifications for her multiple health conditions.  ALLERGIES: No Known Allergies  MEDICATIONS: Current Outpatient Medications on File Prior to Visit  Medication Sig Dispense Refill  . albuterol (VENTOLIN HFA) 108 (90 Base) MCG/ACT inhaler Inhale 2 puffs into the lungs every 6 (six) hours as needed for wheezing or shortness of breath.    [name] amLODipine (NORVASC) 5 MG tablet Take  1 tablet (5 mg total) by mouth daily. 30 tablet 0  . azelastine (ASTELIN) 0.1 % nasal spray Use 1 spray in each nostril twice daily 30 mL 5  . azelastine (OPTIVAR) 0.05 % ophthalmic solution Use 1 drop in each eye twice daily as needed 6 mL 5  . cetirizine (ZYRTEC) 10 MG tablet Take 10 mg by mouth daily.    . ferrous sulfate 325 (65 FE) MG tablet Take 1 tablet (325 mg total) by mouth daily with breakfast. 30 tablet 0  . levocetirizine (XYZAL) 5 MG tablet Take 5 mg by mouth every evening.    . montelukast (SINGULAIR) 10 MG tablet Take 1 tablet (10 mg total) by mouth at bedtime. 30 tablet 5  . Olopatadine HCl (PAZEO) 0.7 % SOLN Place 1 drop into both eyes daily. 2.5 mL 5  . [name] (CYANOCOBALAMIN) 500 MCG tablet Take 500 mcg by mouth daily.    . Vitamin D, Ergocalciferol, (DRISDOL) 1.25 MG (50000 UT) CAPS capsule Take 1 capsule (50,000 Units total) by mouth every 7 (seven) days. 4 capsule 0  . zinc gluconate 50 MG tablet Take 50 mg by mouth daily.     [name].     PAST MEDICAL HISTORY: Past Medical History:  Diagnosis Date  . Anemia   . Hepatitis B   . High cholesterol   . Hypertension    G2 - preeclampsia, CHTN  . Infertility, female   . Lactose intolerance   . Obesity   . Obesity   . Prediabetes 04/01/2018  . Seasonal allergies   . Shortness of breath   . Swelling of both lower extremities   . Urticaria     PAST SURGICAL HISTORY: Past Surgical History:  Procedure Laterality Date  . CESAREAN SECTION    . HIP SURGERY    . TUBAL LIGATION Bilateral 05/05/2014   Procedure: POST PARTUM TUBAL LIGATION;  Surgeon: Donnamae Jude, MD;  Location: [facility];  Service: Gynecology;  Laterality: Bilateral;    SOCIAL HISTORY: Social History   Tobacco Use  . Smoking status: Former Smoker    Years: 0.50    Types: Cigarettes    Quit date: 08/25/1993    Years since quitting: 25.6  . Smokeless tobacco: Never Used  . Tobacco  comment: one pack of cigarettes weekly- 05/27/18  Substance Use Topics  . Alcohol use: Not Currently  . Drug use: No    FAMILY HISTORY: Family History  Problem Relation Age of Onset  . Heart disease Mother   . Thyroid disease Mother   . High blood pressure Mother   . Obesity Mother   . COPD Father   . Allergic rhinitis Neg Hx   . Angioedema Neg Hx   . Asthma Neg Hx   . Eczema Neg Hx   . Immunodeficiency Neg Hx   . Urticaria Neg Hx     ROS: Review of Systems  Constitutional: Negative for weight loss.  Gastrointestinal: Negative for nausea and vomiting.  Musculoskeletal:       Negative for muscle weakness  Endo/Heme/Allergies:       Negative for hypoglycemia    PHYSICAL EXAM:  Pt in no acute distress  RECENT LABS AND TESTS: BMET    Component Value Date/Time   NA 141 01/13/2019 1012   NA 142 08/31/2018 1256   K 4.6 01/13/2019 1012   CL 104 01/13/2019 1012   CO2 28 01/13/2019 1012   GLUCOSE 92 01/13/2019 1012   BUN 10 01/13/2019 1012   BUN 4 (L) 08/31/2018 1256   CREATININE 0.84 01/13/2019 1012   CALCIUM 9.3 01/13/2019 1012   GFRNONAA 86 01/13/2019 1012   GFRAA 99 01/13/2019 1012   Lab Results  Component Value Date   HGBA1C 5.8 (H) 02/03/2019   HGBA1C 5.7 (H) 08/31/2018   HGBA1C 5.7 (H) 03/02/2018   Lab Results  Component Value Date   INSULIN 12.7 02/03/2019   INSULIN 10.4 08/31/2018   CBC    Component Value Date/Time   WBC 3.6 02/03/2019 1146   WBC 3.6 (L) 05/27/2018 1144   RBC 4.17 02/03/2019 1146   RBC 5.14 (H) 05/27/2018 1144   HGB 10.4 (L) 02/03/2019 1146   HGB 10.4 11/21/2013   HCT 31.9 (L) 02/03/2019 1146   HCT 33 11/21/2013   PLT 326.0 05/27/2018 1144   PLT 312 05/05/2018 1009   PLT 306 11/21/2013   MCV 77 (L) 02/03/2019 1146   MCH 24.9 (L) 02/03/2019 1146   MCH 18.8 (L) 02/11/2018 0314   MCHC 32.6 02/03/2019 1146   MCHC 31.0 05/27/2018 1144   RDW 16.4 (H) 02/03/2019 1146   LYMPHSABS 1.5 02/03/2019 1146   MONOABS 0.5 05/27/2018  1144   EOSABS 0.1 02/03/2019 1146   BASOSABS 0.0 02/03/2019 1146   Iron/TIBC/Ferritin/ %Sat    Component Value Date/Time   IRON 34 02/03/2019 1146   TIBC 355 02/03/2019 1146   FERRITIN 23 02/03/2019 1146   IRONPCTSAT 10 (L) 02/03/2019 1146   Lipid Panel     Component Value Date/Time   CHOL 160 03/02/2018 0954   TRIG 75 03/02/2018 0954   HDL 48 03/02/2018 0954   CHOLHDL 3.3 03/02/2018 0954   CHOLHDL 3.8 Ratio 05/07/2009 2031   VLDL 14 05/07/2009 2031   LDLCALC 97 03/02/2018 0954   Hepatic Function Panel     Component Value Date/Time   PROT 7.1 01/13/2019 1012   PROT 7.2 08/31/2018 1256   ALBUMIN 4.1 08/31/2018 1256   AST 16 01/13/2019 1012   ALT 10 01/13/2019 1012   ALT 15 06/17/2018 0939   ALKPHOS 96 08/31/2018 1256   BILITOT 0.5 01/13/2019 1012   BILITOT 0.9 08/31/2018 1256      Component Value Date/Time   TSH 3.140 03/02/2018 0954   TSH 1.950 02/17/2018 1631   TSH 1.511 05/07/2009 2031     Ref. Range 02/03/2019 11:46  Vitamin D, 25-Hydroxy Latest Ref Range: 30.0 - 100.0 ng/mL 41.7    I, Doreene Nest, am acting as Location manager for [employer]. Owens Shark, DO  I have reviewed the above documentation for accuracy and completeness, and I agree with the above. -Jearld Lesch, DO

## 2019-04-13 ENCOUNTER — Ambulatory Visit
Admission: RE | Admit: 2019-04-13 | Discharge: 2019-04-13 | Disposition: A | Discharge status: Home / Self Care | Payer: Managed Care, Other (non HMO) | Source: Ambulatory Visit | Attending: Obstetrics and Gynecology | Admitting: Obstetrics and Gynecology

## 2019-04-13 ENCOUNTER — Ambulatory Visit (INDEPENDENT_AMBULATORY_CARE_PROVIDER_SITE_OTHER): Payer: Managed Care, Other (non HMO) | Admitting: Bariatrics

## 2019-04-13 ENCOUNTER — Encounter (INDEPENDENT_AMBULATORY_CARE_PROVIDER_SITE_OTHER): Payer: Self-pay

## 2019-04-13 ENCOUNTER — Encounter (INDEPENDENT_AMBULATORY_CARE_PROVIDER_SITE_OTHER): Payer: Self-pay | Admitting: Bariatrics

## 2019-04-13 ENCOUNTER — Other Ambulatory Visit: Payer: Self-pay

## 2019-04-13 VITALS — BP 141/79 | HR 86 | Temp 98.5°F | Ht 65.0 in | Wt 398.0 lb

## 2019-04-13 DIAGNOSIS — Z6841 Body Mass Index (BMI) 40.0 and over, adult: Secondary | ICD-10-CM

## 2019-04-13 DIAGNOSIS — E8881 Metabolic syndrome: Secondary | ICD-10-CM

## 2019-04-13 DIAGNOSIS — N6310 Unspecified lump in the right breast, unspecified quadrant: Secondary | ICD-10-CM

## 2019-04-13 DIAGNOSIS — E559 Vitamin D deficiency, unspecified: Secondary | ICD-10-CM

## 2019-04-18 NOTE — Progress Notes (Signed)
Office: [phone]  /  Fax: [phone]   HPI:   Chief Complaint: OBESITY Ann Woods is here to discuss her progress with her obesity treatment plan. Ann Woods is journaling vegan and she is following her eating plan approximately 100 % of the time. She states she is walking 20 minutes 2 times per week. Ann Woods is down 3 pounds. She is doing well with her water. She denies any stress eating or emotional eating. Her weight is (!) 398 lb (180.5 kg) today and has had a weight loss of 3 pounds over a period of 2 weeks since her last visit. She has lost 5 lbs since starting treatment with [place].  Vitamin D deficiency Ann Woods has a diagnosis of vitamin D deficiency. Her last vitamin D level was at 33.1 She is currently taking vit D and denies nausea, vomiting or muscle weakness.  Insulin Resistance  Ann Woods has a diagnosis of insulin resistance based on her elevated fasting insulin level >5. Although Ann Woods's blood glucose readings are still under good control, insulin resistance puts her at greater risk of metabolic syndrome and diabetes. Ann Woods is not on medications and she continues to work on diet and exercise to decrease risk of diabetes.  ASSESSMENT AND PLAN:  Vitamin D deficiency  Insulin resistance  Class 3 severe obesity with serious comorbidity and body mass index (BMI) of 60.0 to 69.9 in adult, unspecified obesity type ([place])  PLAN:  Vitamin D Deficiency Ann Woods was informed that low vitamin D levels contributes to fatigue and are associated with obesity, breast, and colon cancer. Ann Woods will continue to take prescription Vit D @50 ,000 IU every week and she will follow up for routine testing of vitamin D, at least 2-3 times per year. She was informed of the risk of over-replacement of vitamin D and agrees to not increase her dose unless she discusses this with [place] first.  Insulin Resistance Ann Woods will continue to work on weight loss, increasing activity, increasing lean protein  and decreasing simple carbohydrates in her diet to help decrease the risk of diabetes. She was informed that eating too many simple carbohydrates or too many calories at one sitting increases the likelihood of GI side effects. Ann Woods agreed to follow up with [place] as directed to monitor her progress.  I spent > than 50% of the 15 minute visit on counseling as documented in the note.  Obesity Ann Woods is currently in the action stage of change. As such, her goal is to continue with weight loss efforts She has agreed to journal vegan with 1800 calories and 60 grams of protein daily Ann Woods will increase her activity (walk, small weight bands) for weight loss and overall health benefits. We discussed the following Behavioral Modification Strategies today: planning for success, increase H2O intake, no skipping meals, keeping healthy foods in the home, increasing lean protein intake, decreasing simple carbohydrates, increasing vegetables, decrease eating out and work on meal planning and intentional eating  Ann Woods has agreed to follow up with our clinic in 2 weeks. She was informed of the importance of frequent follow up visits to maximize her success with intensive lifestyle modifications for her multiple health conditions.  ALLERGIES: No Known Allergies  MEDICATIONS: Current Outpatient Medications on File Prior to Visit  Medication Sig Dispense Refill  . albuterol (VENTOLIN HFA) 108 (90 Base) MCG/ACT inhaler Inhale 2 puffs into the lungs every 6 (six) hours as needed for wheezing or shortness of breath.    [name] amLODipine (NORVASC) 5 MG tablet Take 1 tablet (5  mg total) by mouth daily. 30 tablet 0  . azelastine (ASTELIN) 0.1 % nasal spray Use 1 spray in each nostril twice daily 30 mL 5  . azelastine (OPTIVAR) 0.05 % ophthalmic solution Use 1 drop in each eye twice daily as needed 6 mL 5  . cetirizine (ZYRTEC) 10 MG tablet Take 10 mg by mouth daily.    [name] levocetirizine (XYZAL) 5 MG tablet Take 5 mg by  mouth every evening.    . montelukast (SINGULAIR) 10 MG tablet Take 1 tablet (10 mg total) by mouth at bedtime. 30 tablet 5  . Olopatadine HCl (PAZEO) 0.7 % SOLN Place 1 drop into both eyes daily. 2.5 mL 5  . [name] (CYANOCOBALAMIN) 500 MCG tablet Take 500 mcg by mouth daily.    . Vitamin D, Ergocalciferol, (DRISDOL) 1.25 MG (50000 UT) CAPS capsule Take 1 capsule (50,000 Units total) by mouth every 7 (seven) days. 4 capsule 0  . zinc gluconate 50 MG tablet Take 50 mg by mouth daily.    . ferrous sulfate 325 (65 FE) MG tablet Take 1 tablet (325 mg total) by mouth daily with breakfast. 30 tablet 0   [name].     PAST MEDICAL HISTORY: Past Medical History:  Diagnosis Date  . Anemia   . Hepatitis B   . High cholesterol   . Hypertension    G2 - preeclampsia, CHTN  . Infertility, female   . Lactose intolerance   . Obesity   . Obesity   . Prediabetes 04/01/2018  . Seasonal allergies   . Shortness of breath   . Swelling of both lower extremities   . Urticaria     PAST SURGICAL HISTORY: Past Surgical History:  Procedure Laterality Date  . CESAREAN SECTION    . HIP SURGERY    . TUBAL LIGATION Bilateral 05/05/2014   Procedure: POST PARTUM TUBAL LIGATION;  Surgeon: Donnamae Jude, MD;  Location: [facility];  Service: Gynecology;  Laterality: Bilateral;    SOCIAL HISTORY: Social History   Tobacco Use  . Smoking status: Former Smoker    Years: 0.50    Types: Cigarettes    Quit date: 08/25/1993    Years since quitting: 25.6  . Smokeless tobacco: Never Used  . Tobacco comment: one pack of cigarettes weekly- 05/27/18  Substance Use Topics  . Alcohol use: Not Currently  . Drug use: No    FAMILY HISTORY: Family History  Problem Relation Age of Onset  . Heart disease Mother   . Thyroid disease Mother   . High blood pressure Mother   . Obesity Mother   . COPD Father   . Allergic rhinitis Neg Hx   . Angioedema Neg Hx    . Asthma Neg Hx   . Eczema Neg Hx   . Immunodeficiency Neg Hx   . Urticaria Neg Hx     ROS: Review of Systems  Constitutional: Positive for weight loss.  Gastrointestinal: Negative for nausea and vomiting.  Musculoskeletal:       Negative for muscle weakness    PHYSICAL EXAM: Blood pressure (!) 141/79, pulse 86, temperature 98.5 F (36.9 C), temperature source Oral, height 5\' 5"  (1.651 m), weight (!) 398 lb (180.5 kg), SpO2 98 %. Body mass index is 66.23 kg/m. Physical Exam Vitals signs reviewed.  Constitutional:      Appearance: Normal appearance. She is well-developed. She is obese.  Cardiovascular:     Rate and Rhythm: Normal rate.  Pulmonary:     Effort: Pulmonary effort is normal.  Musculoskeletal: Normal range of motion.  Skin:    General: Skin is warm and dry.  Neurological:     Mental Status: She is alert and oriented to person, place, and time.  Psychiatric:        Mood and Affect: Mood normal.        Behavior: Behavior normal.     RECENT LABS AND TESTS: BMET    Component Value Date/Time   NA 141 01/13/2019 1012   NA 142 08/31/2018 1256   K 4.6 01/13/2019 1012   CL 104 01/13/2019 1012   CO2 28 01/13/2019 1012   GLUCOSE 92 01/13/2019 1012   BUN 10 01/13/2019 1012   BUN 4 (L) 08/31/2018 1256   CREATININE 0.84 01/13/2019 1012   CALCIUM 9.3 01/13/2019 1012   GFRNONAA 86 01/13/2019 1012   GFRAA 99 01/13/2019 1012   Lab Results  Component Value Date   HGBA1C 5.8 (H) 02/03/2019   HGBA1C 5.7 (H) 08/31/2018   HGBA1C 5.7 (H) 03/02/2018   Lab Results  Component Value Date   INSULIN 12.7 02/03/2019   INSULIN 10.4 08/31/2018   CBC    Component Value Date/Time   WBC 3.6 02/03/2019 1146   WBC 3.6 (L) 05/27/2018 1144   RBC 4.17 02/03/2019 1146   RBC 5.14 (H) 05/27/2018 1144   HGB 10.4 (L) 02/03/2019 1146   HGB 10.4 11/21/2013   HCT 31.9 (L) 02/03/2019 1146   HCT 33 11/21/2013   PLT 326.0 05/27/2018 1144   PLT 312 05/05/2018 1009   PLT 306  11/21/2013   MCV 77 (L) 02/03/2019 1146   MCH 24.9 (L) 02/03/2019 1146   MCH 18.8 (L) 02/11/2018 0314   MCHC 32.6 02/03/2019 1146   MCHC 31.0 05/27/2018 1144   RDW 16.4 (H) 02/03/2019 1146   LYMPHSABS 1.5 02/03/2019 1146   MONOABS 0.5 05/27/2018 1144   EOSABS 0.1 02/03/2019 1146   BASOSABS 0.0 02/03/2019 1146   Iron/TIBC/Ferritin/ %Sat    Component Value Date/Time   IRON 34 02/03/2019 1146   TIBC 355 02/03/2019 1146   FERRITIN 23 02/03/2019 1146   IRONPCTSAT 10 (L) 02/03/2019 1146   Lipid Panel     Component Value Date/Time   CHOL 160 03/02/2018 0954   TRIG 75 03/02/2018 0954   HDL 48 03/02/2018 0954   CHOLHDL 3.3 03/02/2018 0954   CHOLHDL 3.8 Ratio 05/07/2009 2031   VLDL 14 05/07/2009 2031   LDLCALC 97 03/02/2018 0954   Hepatic Function Panel     Component Value Date/Time   PROT 7.1 01/13/2019 1012   PROT 7.2 08/31/2018 1256   ALBUMIN 4.1 08/31/2018 1256   AST 16 01/13/2019 1012   ALT 10 01/13/2019 1012   ALT 15 06/17/2018 0939   ALKPHOS 96 08/31/2018 1256   BILITOT 0.5 01/13/2019 1012   BILITOT 0.9 08/31/2018 1256      Component Value Date/Time   TSH 3.140 03/02/2018 0954   TSH 1.950 02/17/2018 1631   TSH 1.511 05/07/2009 2031      OBESITY BEHAVIORAL INTERVENTION VISIT  Today's visit was # 14   Starting weight: 403 lbs Starting date: 08/31/2018 Today's weight : 398 lbs Today's date: 04/13/2019 Total lbs lost to date: 5    04/13/2019  Height 5\' 5"  ([id number] m)  Weight 398 lb (180.5 kg) (A)  BMI (Calculated) 66.23  BLOOD PRESSURE - SYSTOLIC [id number]  BLOOD PRESSURE - DIASTOLIC 79   Body Fat % [id number] %  ASK: We discussed the diagnosis of obesity with Ann Woods today and Ann Woods agreed to give [place] permission to discuss obesity behavioral modification therapy today.  ASSESS: Ann Woods has the diagnosis of obesity and her BMI today is 66.23  Ann Woods is in the action stage of change   ADVISE: Ann Woods was educated on the multiple health risks of  obesity as well as the benefit of weight loss to improve her health. She was advised of the need for long term treatment and the importance of lifestyle modifications to improve her current health and to decrease her risk of future health problems.  AGREE: Multiple dietary modification options and treatment options were discussed and  Ann Woods agreed to follow the recommendations documented in the above note.  ARRANGE: Ann Woods was educated on the importance of frequent visits to treat obesity as outlined per CMS and USPSTF guidelines and agreed to schedule her next follow up appointment today.  Corey Skains, am acting as Location manager for [employer]. Owens Shark, DO  I have reviewed the above documentation for accuracy and completeness, and I agree with the above. -Jearld Lesch, DO

## 2019-04-19 ENCOUNTER — Encounter (INDEPENDENT_AMBULATORY_CARE_PROVIDER_SITE_OTHER): Payer: Self-pay | Admitting: Bariatrics

## 2019-04-28 ENCOUNTER — Other Ambulatory Visit: Payer: Self-pay

## 2019-04-28 ENCOUNTER — Encounter (INDEPENDENT_AMBULATORY_CARE_PROVIDER_SITE_OTHER): Payer: Self-pay | Admitting: Bariatrics

## 2019-04-28 ENCOUNTER — Ambulatory Visit (INDEPENDENT_AMBULATORY_CARE_PROVIDER_SITE_OTHER): Payer: Managed Care, Other (non HMO) | Admitting: Bariatrics

## 2019-04-28 VITALS — BP 148/90 | HR 62 | Temp 97.5°F | Ht 65.0 in | Wt >= 6400 oz

## 2019-04-28 DIAGNOSIS — E559 Vitamin D deficiency, unspecified: Secondary | ICD-10-CM | POA: Diagnosis not present

## 2019-04-28 DIAGNOSIS — Z6841 Body Mass Index (BMI) 40.0 and over, adult: Secondary | ICD-10-CM

## 2019-04-28 DIAGNOSIS — E8881 Metabolic syndrome: Secondary | ICD-10-CM | POA: Diagnosis not present

## 2019-04-28 DIAGNOSIS — I10 Essential (primary) hypertension: Secondary | ICD-10-CM

## 2019-04-28 NOTE — Progress Notes (Signed)
Office: [phone]  /  Fax: [phone]   HPI:   Chief Complaint: OBESITY Ann Woods is here to discuss her progress with her obesity treatment plan. She is keeping a food journal with 1800 calories and 60 grams of protein and is following her eating plan approximately 100% of the time. She states she is walking 20 minutes 3 times per week. Ann Woods is up 5 lbs of weight (1.5 body water). She is journaling (increased carbohydrates). IC in January was 2734. Her weight is (!) 403 lb (182.8 kg) today and has had a weight gain of 5 lbs since her last visit. She has lost 0 lbs since starting treatment with [place].  Vitamin D deficiency Ann Woods has a diagnosis of Vitamin D deficiency. Last Vitamin D 41.7 on 02/03/2019. She is currently taking Vit D and denies nausea, vomiting or muscle weakness.  Insulin Resistance Ann Woods has a diagnosis of insulin resistance based on her elevated fasting insulin level >5. Last A1c 5.8 on 02/03/2019 with an insulin of 12.7. Although Ann Woods's blood glucose readings are still under good control, insulin resistance puts her at greater risk of metabolic syndrome and diabetes. She is not taking metformin currently and continues to work on diet and exercise to decrease risk of diabetes.  Hypertension Ann Woods is a 43 y.o. female with hypertension.  Ann Woods denies chest pain or shortness of breath on exertion. She is working weight loss to help control her blood pressure with the goal of decreasing her risk of heart attack and stroke. Ann Woods's blood pressure is elevated today at 148/90.  ASSESSMENT AND PLAN:  Vitamin D deficiency  Insulin resistance  Essential hypertension  Class 3 severe obesity with serious comorbidity and body mass index (BMI) of 60.0 to 69.9 in adult, unspecified obesity type ([place])  PLAN:  Vitamin D Deficiency Ann Woods was informed that low Vitamin D levels contributes to fatigue and are associated with obesity, breast, and  colon cancer. She agrees to continue taking Vit D and will follow-up for routine testing of Vitamin D, at least 2-3 times per year. She was informed of the risk of over-replacement of Vitamin D and agrees to not increase her dose unless she discusses this with [place] first. [name] agrees to follow-up with our clinic in 2 weeks.  Insulin Resistance Ann Woods will continue to work on weight loss, exercise, and decreasing simple carbohydrates in her diet to help decrease the risk of diabetes. We dicussed metformin including benefits and risks. She was informed that eating too many simple carbohydrates or too many calories at one sitting increases the likelihood of GI side effects. Ann Woods was instructed to increase protein, decrease carbohydrates, and increase activity.  Hypertension We discussed sodium restriction, working on healthy weight loss, and a regular exercise program as the means to achieve improved blood pressure control. [place] agreed with this plan and agreed to follow up as directed. We will continue to monitor her blood pressure as well as her progress with the above lifestyle modifications. She will continue her medications as prescribed and will watch for signs of hypotension as she continues her lifestyle modifications. Jerzi will check her blood pressure at home.  I spent > than 50% of the 15 minute visit on counseling as documented in the note.  Obesity Ann Woods is currently in the action stage of change. As such, her goal is to continue with weight loss efforts. She has agreed to keep a food journal with 1800 calories and 80 grams of protein.  Ann Woods will work on meal planning and intentional eating. She will have IC performed at her next visit. Ann Woods has been instructed to work up to a goal of 150 minutes of combined cardio and strengthening exercise per week for weight loss and overall health benefits. We discussed the following Behavioral Modification Strategies today:  increasing lean protein intake, decreasing simple carbohydrates, increasing vegetables, increase H20 intake, decrease eating out, no skipping meals, work on meal planning and easy cooking plans, keeping healthy foods in the home, and planning for success.  Ann Woods has agreed to follow-up with our clinic in 2 weeks. She was informed of the importance of frequent follow-up visits to maximize her success with intensive lifestyle modifications for her multiple health conditions.  ALLERGIES: No Known Allergies  MEDICATIONS: Current Outpatient Medications on File Prior to Visit  Medication Sig Dispense Refill  . albuterol (VENTOLIN HFA) 108 (90 Base) MCG/ACT inhaler Inhale 2 puffs into the lungs every 6 (six) hours as needed for wheezing or shortness of breath.    [name] amLODipine (NORVASC) 5 MG tablet Take 1 tablet (5 mg total) by mouth daily. 30 tablet 0  . azelastine (ASTELIN) 0.1 % nasal spray Use 1 spray in each nostril twice daily 30 mL 5  . azelastine (OPTIVAR) 0.05 % ophthalmic solution Use 1 drop in each eye twice daily as needed 6 mL 5  . cetirizine (ZYRTEC) 10 MG tablet Take 10 mg by mouth daily.    [name] levocetirizine (XYZAL) 5 MG tablet Take 5 mg by mouth every evening.    . montelukast (SINGULAIR) 10 MG tablet Take 1 tablet (10 mg total) by mouth at bedtime. 30 tablet 5  . Olopatadine HCl (PAZEO) 0.7 % SOLN Place 1 drop into both eyes daily. 2.5 mL 5  . [name] (CYANOCOBALAMIN) 500 MCG tablet Take 500 mcg by mouth daily.    . Vitamin D, Ergocalciferol, (DRISDOL) 1.25 MG (50000 UT) CAPS capsule Take 1 capsule (50,000 Units total) by mouth every 7 (seven) days. 4 capsule 0  . zinc gluconate 50 MG tablet Take 50 mg by mouth daily.    . ferrous sulfate 325 (65 FE) MG tablet Take 1 tablet (325 mg total) by mouth daily with breakfast. 30 tablet 0   [name].     PAST MEDICAL HISTORY: Past Medical History:  Diagnosis Date  .  Anemia   . Hepatitis B   . High cholesterol   . Hypertension    G2 - preeclampsia, CHTN  . Infertility, female   . Lactose intolerance   . Obesity   . Obesity   . Prediabetes 04/01/2018  . Seasonal allergies   . Shortness of breath   . Swelling of both lower extremities   . Urticaria     PAST SURGICAL HISTORY: Past Surgical History:  Procedure Laterality Date  . CESAREAN SECTION    . HIP SURGERY    . TUBAL LIGATION Bilateral 05/05/2014   Procedure: POST PARTUM TUBAL LIGATION;  Surgeon: Donnamae Jude, MD;  Location: [facility];  Service: Gynecology;  Laterality: Bilateral;    SOCIAL HISTORY: Social History   Tobacco Use  . Smoking status: Former Smoker    Years: 0.50    Types: Cigarettes    Quit date: 08/25/1993    Years since quitting: 25.6  . Smokeless tobacco: Never Used  . Tobacco comment: one pack of cigarettes weekly- 05/27/18  Substance Use Topics  . Alcohol use: Not Currently  .  Drug use: No    FAMILY HISTORY: Family History  Problem Relation Age of Onset  . Heart disease Mother   . Thyroid disease Mother   . High blood pressure Mother   . Obesity Mother   . COPD Father   . Allergic rhinitis Neg Hx   . Angioedema Neg Hx   . Asthma Neg Hx   . Eczema Neg Hx   . Immunodeficiency Neg Hx   . Urticaria Neg Hx    ROS: Review of Systems  Respiratory: Negative for sputum production.   Cardiovascular: Negative for chest pain.  Gastrointestinal: Negative for nausea and vomiting.  Musculoskeletal:       Negative for muscle weakness.   PHYSICAL EXAM: Blood pressure (!) 148/90, pulse 62, temperature (!) 97.5 F (36.4 C), temperature source Oral, height 5\' 5"  (1.651 m), weight (!) 403 lb (182.8 kg), SpO2 98 %. Body mass index is 67.06 kg/m. Physical Exam Vitals signs reviewed.  Constitutional:      Appearance: Normal appearance. She is obese.  Cardiovascular:     Rate and Rhythm: Normal rate.     Pulses: Normal pulses.  Pulmonary:     Effort: Pulmonary  effort is normal.     Breath sounds: Normal breath sounds.  Musculoskeletal: Normal range of motion.  Skin:    General: Skin is warm and dry.  Neurological:     Mental Status: She is alert and oriented to person, place, and time.  Psychiatric:        Behavior: Behavior normal.   RECENT LABS AND TESTS: BMET    Component Value Date/Time   NA 141 01/13/2019 1012   NA 142 08/31/2018 1256   K 4.6 01/13/2019 1012   CL 104 01/13/2019 1012   CO2 28 01/13/2019 1012   GLUCOSE 92 01/13/2019 1012   BUN 10 01/13/2019 1012   BUN 4 (L) 08/31/2018 1256   CREATININE 0.84 01/13/2019 1012   CALCIUM 9.3 01/13/2019 1012   GFRNONAA 86 01/13/2019 1012   GFRAA 99 01/13/2019 1012   Lab Results  Component Value Date   HGBA1C 5.8 (H) 02/03/2019   HGBA1C 5.7 (H) 08/31/2018   HGBA1C 5.7 (H) 03/02/2018   Lab Results  Component Value Date   INSULIN 12.7 02/03/2019   INSULIN 10.4 08/31/2018   CBC    Component Value Date/Time   WBC 3.6 02/03/2019 1146   WBC 3.6 (L) 05/27/2018 1144   RBC 4.17 02/03/2019 1146   RBC 5.14 (H) 05/27/2018 1144   HGB 10.4 (L) 02/03/2019 1146   HGB 10.4 11/21/2013   HCT 31.9 (L) 02/03/2019 1146   HCT 33 11/21/2013   PLT 326.0 05/27/2018 1144   PLT 312 05/05/2018 1009   PLT 306 11/21/2013   MCV 77 (L) 02/03/2019 1146   MCH 24.9 (L) 02/03/2019 1146   MCH 18.8 (L) 02/11/2018 0314   MCHC 32.6 02/03/2019 1146   MCHC 31.0 05/27/2018 1144   RDW 16.4 (H) 02/03/2019 1146   LYMPHSABS 1.5 02/03/2019 1146   MONOABS 0.5 05/27/2018 1144   EOSABS 0.1 02/03/2019 1146   BASOSABS 0.0 02/03/2019 1146   Iron/TIBC/Ferritin/ %Sat    Component Value Date/Time   IRON 34 02/03/2019 1146   TIBC 355 02/03/2019 1146   FERRITIN 23 02/03/2019 1146   IRONPCTSAT 10 (L) 02/03/2019 1146   Lipid Panel     Component Value Date/Time   CHOL 160 03/02/2018 0954   TRIG 75 03/02/2018 0954   HDL 48 03/02/2018 0954   CHOLHDL  3.3 03/02/2018 0954   CHOLHDL 3.8 Ratio 05/07/2009 2031   VLDL  14 05/07/2009 2031   LDLCALC 97 03/02/2018 0954   Hepatic Function Panel     Component Value Date/Time   PROT 7.1 01/13/2019 1012   PROT 7.2 08/31/2018 1256   ALBUMIN 4.1 08/31/2018 1256   AST 16 01/13/2019 1012   ALT 10 01/13/2019 1012   ALT 15 06/17/2018 0939   ALKPHOS 96 08/31/2018 1256   BILITOT 0.5 01/13/2019 1012   BILITOT 0.9 08/31/2018 1256      Component Value Date/Time   TSH 3.140 03/02/2018 0954   TSH 1.950 02/17/2018 1631   TSH 1.511 05/07/2009 2031   Results for SKYLEA, MEINE (MRN [id number]) as of 04/28/2019 11:41  Ref. Range 02/03/2019 11:46  Vitamin D, 25-Hydroxy Latest Ref Range: 30.0 - 100.0 ng/mL 41.7   OBESITY BEHAVIORAL INTERVENTION VISIT  Today's visit was #15  Starting weight: 403 lbs Starting date: 08/31/2018 Today's weight: 403 lbs  Today's date: 04/28/2019 Total lbs lost to date: 0    04/28/2019  Height 5\' 5"  (1.651 m)  Weight 403 lb (182.8 kg) (A)  BMI (Calculated) 67.06  BLOOD PRESSURE - SYSTOLIC [id number]  BLOOD PRESSURE - DIASTOLIC 90   Body Fat % [id number] %   ASK: We discussed the diagnosis of obesity with [name] today and Shanasia agreed to give [place] permission to discuss obesity behavioral modification therapy today.  ASSESS: Margrethe has the diagnosis of obesity and her BMI today is 67.1. Lakshmi is in the action stage of change.   ADVISE: Chiann was educated on the multiple health risks of obesity as well as the benefit of weight loss to improve her health. She was advised of the need for long term treatment and the importance of lifestyle modifications to improve her current health and to decrease her risk of future health problems.  AGREE: Multiple dietary modification options and treatment options were discussed and  Asuka agreed to follow the recommendations documented in the above note.  ARRANGE: Silvestra was educated on the importance of frequent visits to treat obesity as outlined per CMS and USPSTF guidelines and agreed  to schedule her next follow up appointment today.  Migdalia Dk, am acting as Location manager for [employer], DO  I have reviewed the above documentation for accuracy and completeness, and I agree with the above. -Jearld Lesch, DO

## 2019-05-12 ENCOUNTER — Other Ambulatory Visit: Payer: Self-pay

## 2019-05-12 ENCOUNTER — Encounter (INDEPENDENT_AMBULATORY_CARE_PROVIDER_SITE_OTHER): Payer: Self-pay | Admitting: Family Medicine

## 2019-05-12 ENCOUNTER — Ambulatory Visit (INDEPENDENT_AMBULATORY_CARE_PROVIDER_SITE_OTHER): Payer: Managed Care, Other (non HMO) | Admitting: Family Medicine

## 2019-05-12 VITALS — BP 136/85 | HR 89 | Temp 98.5°F | Ht 65.0 in | Wt >= 6400 oz

## 2019-05-12 DIAGNOSIS — E559 Vitamin D deficiency, unspecified: Secondary | ICD-10-CM | POA: Diagnosis not present

## 2019-05-12 DIAGNOSIS — R7303 Prediabetes: Secondary | ICD-10-CM

## 2019-05-12 DIAGNOSIS — Z9189 Other specified personal risk factors, not elsewhere classified: Secondary | ICD-10-CM | POA: Diagnosis not present

## 2019-05-12 DIAGNOSIS — E66813 Obesity, class 3: Secondary | ICD-10-CM

## 2019-05-12 DIAGNOSIS — Z6841 Body Mass Index (BMI) 40.0 and over, adult: Secondary | ICD-10-CM

## 2019-05-12 MED ORDER — VITAMIN D (ERGOCALCIFEROL) 1.25 MG (50000 UNIT) PO CAPS: 50000.0000 [IU] | ORAL_CAPSULE | ORAL | 0 refills | Status: DC

## 2019-05-17 NOTE — Progress Notes (Signed)
Office: [phone]  /  Fax: [phone]   HPI:   Chief Complaint: OBESITY Ann Woods is here to discuss her progress with her obesity treatment plan. She is on the keep a food journal with 1800 calories and 80 grams of protein daily plan and is following her eating plan approximately 100 % of the time. She states she is walking 20 minutes 3 times per week. Ann Woods has been eating vegan and she is getting protein with meat substitutes. She feels she has stuck to the plan well but her weight loss is minimal. Her weight is (!) 401 lb 9.6 oz (182.2 kg) today and has had a weight loss of 2 pounds over a period of 2 weeks since her last visit. She has lost 2 lbs since starting treatment with [place].  Vitamin D deficiency Ann Woods has a diagnosis of vitamin D deficiency. Ann Woods is currently taking vit D and her vitamin D level is coming up well. Her fatigue has improved and she denies nausea, vomiting or muscle weakness.  At risk for osteopenia and osteoporosis Ann Woods is at higher risk of osteopenia and osteoporosis due to vitamin D deficiency.   Pre-Diabetes Ann Woods has a diagnosis of prediabetes based on her elevated Hgb A1c and was informed this puts her at greater risk of developing diabetes. She is not taking metformin currently and continues to work on diet and exercise to decrease risk of diabetes. She denies polyphagia. Lab Results  Component Value Date   HGBA1C 5.8 (H) 02/03/2019    ASSESSMENT AND PLAN:  Vitamin D deficiency - Plan: Vitamin D, Ergocalciferol, (DRISDOL) 1.25 MG (50000 UT) CAPS capsule  Prediabetes  At risk for osteoporosis  Class 3 severe obesity with serious comorbidity and body mass index (BMI) of 60.0 to 69.9 in adult, unspecified obesity type ([place])  PLAN:  Vitamin D Deficiency Ann Woods was informed that low vitamin D levels contributes to fatigue and are associated with obesity, breast, and colon cancer. Ann Woods agrees to continue to take prescription Vit D  @50 ,000 IU every week #4 with no refills and she will follow up for routine testing of vitamin D, at least 2-3 times per year. She was informed of the risk of over-replacement of vitamin D and agrees to not increase her dose unless she discusses this with [place] first. Ann Woods agrees to follow up with our clinic in 3 weeks.  At risk for osteopenia and osteoporosis Ann Woods was given extended  (15 minutes) osteoporosis prevention counseling today. Ann Woods is at risk for osteopenia and osteoporosis due to her vitamin D deficiency. She was encouraged to take her vitamin D and follow her higher calcium diet and increase strengthening exercise to help strengthen her bones and decrease her risk of osteopenia and osteoporosis.  Pre-Diabetes Ann Woods will continue to work on weight loss, exercise, and decreasing simple carbohydrates in her diet to help decrease the risk of diabetes. She was informed that eating too many simple carbohydrates or too many calories at one sitting increases the likelihood of GI side effects. Ann Woods will continue with the meal plan and follow up with [place] as directed to monitor her progress.  Obesity Ann Woods is currently in the action stage of change. As such, her goal is to continue with weight loss efforts She has agreed to keep a food journal with 1800 calories and 80 to 90 grams of protein daily Ann Woods has been instructed to work up to a goal of 150 minutes of combined cardio and strengthening exercise per week for weight  loss and overall health benefits. We discussed the following Behavioral Modification Strategies today: planning for success and increasing lean protein intake  We will check indirect calorimetry at the next visit.  Ann Woods has agreed to follow up with our clinic in 3 weeks. She was informed of the importance of frequent follow up visits to maximize her success with intensive lifestyle modifications for her multiple health conditions.  ALLERGIES: No Known  Allergies  MEDICATIONS: Current Outpatient Medications on File Prior to Visit  Medication Sig Dispense Refill  . albuterol (VENTOLIN HFA) 108 (90 Base) MCG/ACT inhaler Inhale 2 puffs into the lungs every 6 (six) hours as needed for wheezing or shortness of breath.    [name] amLODipine (NORVASC) 5 MG tablet Take 1 tablet (5 mg total) by mouth daily. 30 tablet 0  . azelastine (ASTELIN) 0.1 % nasal spray Use 1 spray in each nostril twice daily 30 mL 5  . azelastine (OPTIVAR) 0.05 % ophthalmic solution Use 1 drop in each eye twice daily as needed 6 mL 5  . cetirizine (ZYRTEC) 10 MG tablet Take 10 mg by mouth daily.    . ferrous sulfate 325 (65 FE) MG tablet Take 1 tablet (325 mg total) by mouth daily with breakfast. 30 tablet 0  . levocetirizine (XYZAL) 5 MG tablet Take 5 mg by mouth every evening.    . montelukast (SINGULAIR) 10 MG tablet Take 1 tablet (10 mg total) by mouth at bedtime. 30 tablet 5  . Olopatadine HCl (PAZEO) 0.7 % SOLN Place 1 drop into both eyes daily. 2.5 mL 5  . [name] (CYANOCOBALAMIN) 500 MCG tablet Take 500 mcg by mouth daily.    [name] zinc gluconate 50 MG tablet Take 50 mg by mouth daily.     [name].     PAST MEDICAL HISTORY: Past Medical History:  Diagnosis Date  . Anemia   . Hepatitis B   . High cholesterol   . Hypertension    G2 - preeclampsia, CHTN  . Infertility, female   . Lactose intolerance   . Obesity   . Obesity   . Prediabetes 04/01/2018  . Seasonal allergies   . Shortness of breath   . Swelling of both lower extremities   . Urticaria     PAST SURGICAL HISTORY: Past Surgical History:  Procedure Laterality Date  . CESAREAN SECTION    . HIP SURGERY    . TUBAL LIGATION Bilateral 05/05/2014   Procedure: POST PARTUM TUBAL LIGATION;  Surgeon: Donnamae Jude, MD;  Location: [facility];  Service: Gynecology;  Laterality: Bilateral;    SOCIAL HISTORY: Social History   Tobacco Use  . Smoking  status: Former Smoker    Years: 0.50    Types: Cigarettes    Quit date: 08/25/1993    Years since quitting: 25.7  . Smokeless tobacco: Never Used  . Tobacco comment: one pack of cigarettes weekly- 05/27/18  Substance Use Topics  . Alcohol use: Not Currently  . Drug use: No    FAMILY HISTORY: Family History  Problem Relation Age of Onset  . Heart disease Mother   . Thyroid disease Mother   . High blood pressure Mother   . Obesity Mother   . COPD Father   . Allergic rhinitis Neg Hx   . Angioedema Neg Hx   . Asthma Neg Hx   . Eczema Neg Hx   . Immunodeficiency Neg Hx   . Urticaria Neg Hx  ROS: Review of Systems  Constitutional: Positive for weight loss. Negative for malaise/fatigue.  Gastrointestinal: Negative for nausea and vomiting.  Musculoskeletal:       Negative for muscle weakness  Endo/Heme/Allergies:       Negative for polyphagia    PHYSICAL EXAM: Blood pressure 136/85, pulse 89, temperature 98.5 F (36.9 C), temperature source Oral, height 5\' 5"  (1.651 m), weight (!) 401 lb 9.6 oz (182.2 kg), SpO2 96 %. Body mass index is 66.83 kg/m. Physical Exam Vitals signs reviewed.  Constitutional:      Appearance: Normal appearance. She is well-developed. She is obese.  Cardiovascular:     Rate and Rhythm: Normal rate.  Pulmonary:     Effort: Pulmonary effort is normal.  Musculoskeletal: Normal range of motion.  Skin:    General: Skin is warm and dry.  Neurological:     Mental Status: She is alert and oriented to person, place, and time.  Psychiatric:        Mood and Affect: Mood normal.        Behavior: Behavior normal.     RECENT LABS AND TESTS: BMET    Component Value Date/Time   NA 141 01/13/2019 1012   NA 142 08/31/2018 1256   K 4.6 01/13/2019 1012   CL 104 01/13/2019 1012   CO2 28 01/13/2019 1012   GLUCOSE 92 01/13/2019 1012   BUN 10 01/13/2019 1012   BUN 4 (L) 08/31/2018 1256   CREATININE 0.84 01/13/2019 1012   CALCIUM 9.3 01/13/2019 1012    GFRNONAA 86 01/13/2019 1012   GFRAA 99 01/13/2019 1012   Lab Results  Component Value Date   HGBA1C 5.8 (H) 02/03/2019   HGBA1C 5.7 (H) 08/31/2018   HGBA1C 5.7 (H) 03/02/2018   Lab Results  Component Value Date   INSULIN 12.7 02/03/2019   INSULIN 10.4 08/31/2018   CBC    Component Value Date/Time   WBC 3.6 02/03/2019 1146   WBC 3.6 (L) 05/27/2018 1144   RBC 4.17 02/03/2019 1146   RBC 5.14 (H) 05/27/2018 1144   HGB 10.4 (L) 02/03/2019 1146   HGB 10.4 11/21/2013   HCT 31.9 (L) 02/03/2019 1146   HCT 33 11/21/2013   PLT 326.0 05/27/2018 1144   PLT 312 05/05/2018 1009   PLT 306 11/21/2013   MCV 77 (L) 02/03/2019 1146   MCH 24.9 (L) 02/03/2019 1146   MCH 18.8 (L) 02/11/2018 0314   MCHC 32.6 02/03/2019 1146   MCHC 31.0 05/27/2018 1144   RDW 16.4 (H) 02/03/2019 1146   LYMPHSABS 1.5 02/03/2019 1146   MONOABS 0.5 05/27/2018 1144   EOSABS 0.1 02/03/2019 1146   BASOSABS 0.0 02/03/2019 1146   Iron/TIBC/Ferritin/ %Sat    Component Value Date/Time   IRON 34 02/03/2019 1146   TIBC 355 02/03/2019 1146   FERRITIN 23 02/03/2019 1146   IRONPCTSAT 10 (L) 02/03/2019 1146   Lipid Panel     Component Value Date/Time   CHOL 160 03/02/2018 0954   TRIG 75 03/02/2018 0954   HDL 48 03/02/2018 0954   CHOLHDL 3.3 03/02/2018 0954   CHOLHDL 3.8 Ratio 05/07/2009 2031   VLDL 14 05/07/2009 2031   LDLCALC 97 03/02/2018 0954   Hepatic Function Panel     Component Value Date/Time   PROT 7.1 01/13/2019 1012   PROT 7.2 08/31/2018 1256   ALBUMIN 4.1 08/31/2018 1256   AST 16 01/13/2019 1012   ALT 10 01/13/2019 1012   ALT 15 06/17/2018 0939   ALKPHOS 96 08/31/2018  1256   BILITOT 0.5 01/13/2019 1012   BILITOT 0.9 08/31/2018 1256      Component Value Date/Time   TSH 3.140 03/02/2018 0954   TSH 1.950 02/17/2018 1631   TSH 1.511 05/07/2009 2031      OBESITY BEHAVIORAL INTERVENTION VISIT  Today's visit was # 16   Starting weight: 403 lbs Starting date: 08/31/2018 Today's weight :  401 lbs Today's date: 05/12/2019 Total lbs lost to date: 2    05/12/2019  Height 5\' 5"  (1.651 m)  Weight 401 lb 9.6 oz (182.2 kg) (A)  BMI (Calculated) 66.83  BLOOD PRESSURE - SYSTOLIC [id number]  BLOOD PRESSURE - DIASTOLIC 85   Body Fat % [id number] %    ASK: We discussed the diagnosis of obesity with Ann Woods today and Ann Woods agreed to give [place] permission to discuss obesity behavioral modification therapy today.  ASSESS: Ann Woods has the diagnosis of obesity and her BMI today is 55.83 Ann Woods is in the action stage of change   ADVISE: Zoei was educated on the multiple health risks of obesity as well as the benefit of weight loss to improve her health. She was advised of the need for long term treatment and the importance of lifestyle modifications to improve her current health and to decrease her risk of future health problems.  AGREE: Multiple dietary modification options and treatment options were discussed and  Ann Woods agreed to follow the recommendations documented in the above note.  ARRANGE: Ann Woods was educated on the importance of frequent visits to treat obesity as outlined per CMS and USPSTF guidelines and agreed to schedule her next follow up appointment today.  I, Doreene Nest, am acting as transcriptionist for [employer], FNP-C  I have reviewed the above documentation for accuracy and completeness, and I agree with the above.  -  , FNP-C.

## 2019-05-19 ENCOUNTER — Encounter (INDEPENDENT_AMBULATORY_CARE_PROVIDER_SITE_OTHER): Payer: Self-pay | Admitting: Family Medicine

## 2019-06-02 ENCOUNTER — Ambulatory Visit (INDEPENDENT_AMBULATORY_CARE_PROVIDER_SITE_OTHER): Payer: Managed Care, Other (non HMO) | Admitting: Family Medicine

## 2019-06-02 ENCOUNTER — Other Ambulatory Visit: Payer: Self-pay

## 2019-06-02 VITALS — BP 140/94 | HR 87 | Temp 97.6°F | Ht 65.0 in | Wt >= 6400 oz

## 2019-06-02 DIAGNOSIS — R7303 Prediabetes: Secondary | ICD-10-CM | POA: Diagnosis not present

## 2019-06-02 DIAGNOSIS — Z6841 Body Mass Index (BMI) 40.0 and over, adult: Secondary | ICD-10-CM

## 2019-06-06 NOTE — Progress Notes (Signed)
Office: [phone]  /  Fax: [phone]   HPI:   Chief Complaint: OBESITY Ann Woods is here to discuss her progress with her obesity treatment plan. She is on the keep a food journal with 1800 calories and 90 grams of protein daily plan and she is following her eating plan approximately 80 % of the time. She states she is walking 20 minutes 2 times per week. Ann Woods is nearly meeting her protein goals. Her intake is 80 grams most days. She goes over 1800 calories a couple of days a week. She is vegan and it is difficult to get the protein in. She uses non-meat alternatives. Ann Woods snacks on nuts and dried fruit but she measures them. She is journaling consistently.  Her weight is (!) 404 lb (183.3 kg) today and has had a weight gain of 3 pounds over a period of 3 weeks since her last visit. She has gained 1 lb since starting treatment with [place].  Pre-Diabetes Ann Woods has a diagnosis of prediabetes based on her elevated Hgb A1c and was informed this puts her at greater risk of developing diabetes. She is not taking metformin currently and continues to work on diet and exercise to decrease risk of diabetes. She denies polyphagia. Lab Results  Component Value Date   HGBA1C 5.8 (H) 02/03/2019    ASSESSMENT AND PLAN:  Prediabetes  Class 3 severe obesity with serious comorbidity and body mass index (BMI) of 60.0 to 69.9 in adult, unspecified obesity type [facility])  PLAN:  Pre-Diabetes Ann Woods will continue to work on weight loss, exercise, and decreasing simple carbohydrates in her diet to help decrease the risk of diabetes Ann Woods will continue with the meal plan and follow up with [place] as directed to monitor her progress.  Obesity Ann Woods is currently in the action stage of change. As such, her goal is to continue with weight loss efforts She has agreed to keep a food journal with 1800 calories and 90 grams of protein daily Ann Woods will increase walking to 3 days per week for weight loss  and overall health benefits. We discussed the following Behavioral Modification Strategies today: planning for success, increase H2O intake, increasing lean protein intake and increasing vegetables Weight loss is sub-optimal. We will check indirect calorimetry at the next visit. Patient did not check in early enough today for indirect calorimetry.  Ann Woods has agreed to follow up with our clinic in 2 weeks. She was informed of the importance of frequent follow up visits to maximize her success with intensive lifestyle modifications for her multiple health conditions.  ALLERGIES: No Known Allergies  MEDICATIONS: Current Outpatient Medications on File Prior to Visit  Medication Sig Dispense Refill  . albuterol (VENTOLIN HFA) 108 (90 Base) MCG/ACT inhaler Inhale 2 puffs into the lungs every 6 (six) hours as needed for wheezing or shortness of breath.    [name] amLODipine (NORVASC) 5 MG tablet Take 1 tablet (5 mg total) by mouth daily. 30 tablet 0  . azelastine (ASTELIN) 0.1 % nasal spray Use 1 spray in each nostril twice daily 30 mL 5  . azelastine (OPTIVAR) 0.05 % ophthalmic solution Use 1 drop in each eye twice daily as needed 6 mL 5  . cetirizine (ZYRTEC) 10 MG tablet Take 10 mg by mouth daily.    [name] levocetirizine (XYZAL) 5 MG tablet Take 5 mg by mouth every evening.    . montelukast (SINGULAIR) 10 MG tablet Take 1 tablet (10 mg total) by mouth at bedtime. 30 tablet 5  .  Olopatadine HCl (PAZEO) 0.7 % SOLN Place 1 drop into both eyes daily. 2.5 mL 5  . [name] (CYANOCOBALAMIN) 500 MCG tablet Take 500 mcg by mouth daily.    . Vitamin D, Ergocalciferol, (DRISDOL) 1.25 MG (50000 UT) CAPS capsule Take 1 capsule (50,000 Units total) by mouth every 7 (seven) days. 4 capsule 0  . zinc gluconate 50 MG tablet Take 50 mg by mouth daily.    . ferrous sulfate 325 (65 FE) MG tablet Take 1 tablet (325 mg total) by mouth daily with breakfast. 30 tablet 0   No current facility-administered medications on  file prior to visit.     PAST MEDICAL HISTORY: Past Medical History:  Diagnosis Date  . Anemia   . Hepatitis B   . High cholesterol   . Hypertension    G2 - preeclampsia, CHTN  . Infertility, female   . Lactose intolerance   . Obesity   . Obesity   . Prediabetes 04/01/2018  . Seasonal allergies   . Shortness of breath   . Swelling of both lower extremities   . Urticaria     PAST SURGICAL HISTORY: Past Surgical History:  Procedure Laterality Date  . CESAREAN SECTION    . HIP SURGERY    . TUBAL LIGATION Bilateral 05/05/2014   Procedure: POST PARTUM TUBAL LIGATION;  Surgeon: Donnamae Jude, MD;  Location: [facility];  Service: Gynecology;  Laterality: Bilateral;    SOCIAL HISTORY: Social History   Tobacco Use  . Smoking status: Former Smoker    Years: 0.50    Types: Cigarettes    Quit date: 08/25/1993    Years since quitting: 25.7  . Smokeless tobacco: Never Used  . Tobacco comment: one pack of cigarettes weekly- 05/27/18  Substance Use Topics  . Alcohol use: Not Currently  . Drug use: No    FAMILY HISTORY: Family History  Problem Relation Age of Onset  . Heart disease Mother   . Thyroid disease Mother   . High blood pressure Mother   . Obesity Mother   . COPD Father   . Allergic rhinitis Neg Hx   . Angioedema Neg Hx   . Asthma Neg Hx   . Eczema Neg Hx   . Immunodeficiency Neg Hx   . Urticaria Neg Hx     ROS: Review of Systems  Constitutional: Negative for weight loss.  Endo/Heme/Allergies:       Negative for polyphagia    PHYSICAL EXAM: Blood pressure (!) 140/94, pulse 87, temperature 97.6 F (36.4 C), temperature source Oral, height 5\' 5"  (1.651 m), weight (!) 404 lb (183.3 kg), SpO2 97 %. Body mass index is 67.23 kg/m. Physical Exam Vitals signs reviewed.  Constitutional:      Appearance: Normal appearance. She is well-developed. She is obese.  Cardiovascular:     Rate and Rhythm: Normal rate.  Pulmonary:     Effort: Pulmonary effort is normal.   Musculoskeletal: Normal range of motion.  Skin:    General: Skin is warm and dry.  Neurological:     Mental Status: She is alert and oriented to person, place, and time.  Psychiatric:        Mood and Affect: Mood normal.        Behavior: Behavior normal.     RECENT LABS AND TESTS: BMET    Component Value Date/Time   NA 141 01/13/2019 1012   NA 142 08/31/2018 1256   K 4.6 01/13/2019 1012   CL 104 01/13/2019 1012  CO2 28 01/13/2019 1012   GLUCOSE 92 01/13/2019 1012   BUN 10 01/13/2019 1012   BUN 4 (L) 08/31/2018 1256   CREATININE 0.84 01/13/2019 1012   CALCIUM 9.3 01/13/2019 1012   GFRNONAA 86 01/13/2019 1012   GFRAA 99 01/13/2019 1012   Lab Results  Component Value Date   HGBA1C 5.8 (H) 02/03/2019   HGBA1C 5.7 (H) 08/31/2018   HGBA1C 5.7 (H) 03/02/2018   Lab Results  Component Value Date   INSULIN 12.7 02/03/2019   INSULIN 10.4 08/31/2018   CBC    Component Value Date/Time   WBC 3.6 02/03/2019 1146   WBC 3.6 (L) 05/27/2018 1144   RBC 4.17 02/03/2019 1146   RBC 5.14 (H) 05/27/2018 1144   HGB 10.4 (L) 02/03/2019 1146   HGB 10.4 11/21/2013   HCT 31.9 (L) 02/03/2019 1146   HCT 33 11/21/2013   PLT 326.0 05/27/2018 1144   PLT 312 05/05/2018 1009   PLT 306 11/21/2013   MCV 77 (L) 02/03/2019 1146   MCH 24.9 (L) 02/03/2019 1146   MCH 18.8 (L) 02/11/2018 0314   MCHC 32.6 02/03/2019 1146   MCHC 31.0 05/27/2018 1144   RDW 16.4 (H) 02/03/2019 1146   LYMPHSABS 1.5 02/03/2019 1146   MONOABS 0.5 05/27/2018 1144   EOSABS 0.1 02/03/2019 1146   BASOSABS 0.0 02/03/2019 1146   Iron/TIBC/Ferritin/ %Sat    Component Value Date/Time   IRON 34 02/03/2019 1146   TIBC 355 02/03/2019 1146   FERRITIN 23 02/03/2019 1146   IRONPCTSAT 10 (L) 02/03/2019 1146   Lipid Panel     Component Value Date/Time   CHOL 160 03/02/2018 0954   TRIG 75 03/02/2018 0954   HDL 48 03/02/2018 0954   CHOLHDL 3.3 03/02/2018 0954   CHOLHDL 3.8 Ratio 05/07/2009 2031   VLDL 14 05/07/2009 2031    LDLCALC 97 03/02/2018 0954   Hepatic Function Panel     Component Value Date/Time   PROT 7.1 01/13/2019 1012   PROT 7.2 08/31/2018 1256   ALBUMIN 4.1 08/31/2018 1256   AST 16 01/13/2019 1012   ALT 10 01/13/2019 1012   ALT 15 06/17/2018 0939   ALKPHOS 96 08/31/2018 1256   BILITOT 0.5 01/13/2019 1012   BILITOT 0.9 08/31/2018 1256      Component Value Date/Time   TSH 3.140 03/02/2018 0954   TSH 1.950 02/17/2018 1631   TSH 1.511 05/07/2009 2031     Ref. Range 02/03/2019 11:46  Vitamin D, 25-Hydroxy Latest Ref Range: 30.0 - 100.0 ng/mL 41.7    OBESITY BEHAVIORAL INTERVENTION VISIT  Today's visit was # 17   Starting weight: 403 lbs Starting date: 08/31/2018 Today's weight : 404 lbs Today's date: 06/02/2019 Total lbs lost to date: 0    06/02/2019  Height 5\' 5"  (1.651 m)  Weight 404 lb (183.3 kg) (A)  BMI (Calculated) 67.23  BLOOD PRESSURE - SYSTOLIC [id number]  BLOOD PRESSURE - DIASTOLIC 94   Body Fat % 99991111 %    ASK: We discussed the diagnosis of obesity with Ann Woods today and Ann Woods agreed to give [place] permission to discuss obesity behavioral modification therapy today.  ASSESS: Ann Woods has the diagnosis of obesity and her BMI today is 67.23 Rushie is in the action stage of change   ADVISE: Bahati was educated on the multiple health risks of obesity as well as the benefit of weight loss to improve her health. She was advised of the need for long term treatment and the importance of lifestyle  modifications to improve her current health and to decrease her risk of future health problems.  AGREE: Multiple dietary modification options and treatment options were discussed and  Ann Woods agreed to follow the recommendations documented in the above note.  ARRANGE: Ann Woods was educated on the importance of frequent visits to treat obesity as outlined per CMS and USPSTF guidelines and agreed to schedule her next follow up appointment today.  Corey Skains, am acting  as Location manager for [employer], FNP-C.  I have reviewed the above documentation for accuracy and completeness, and I agree with the above.  - Adream Parzych, FNP-C.

## 2019-06-07 ENCOUNTER — Encounter (INDEPENDENT_AMBULATORY_CARE_PROVIDER_SITE_OTHER): Payer: Self-pay | Admitting: Family Medicine

## 2019-06-23 ENCOUNTER — Other Ambulatory Visit: Payer: Self-pay

## 2019-06-23 ENCOUNTER — Ambulatory Visit (INDEPENDENT_AMBULATORY_CARE_PROVIDER_SITE_OTHER): Payer: Managed Care, Other (non HMO) | Admitting: Family Medicine

## 2019-06-23 ENCOUNTER — Encounter: Payer: Self-pay | Admitting: Family Medicine

## 2019-06-23 ENCOUNTER — Encounter (INDEPENDENT_AMBULATORY_CARE_PROVIDER_SITE_OTHER): Payer: Self-pay | Admitting: Family Medicine

## 2019-06-23 VITALS — BP 135/83 | HR 70 | Temp 97.7°F | Ht 65.0 in | Wt >= 6400 oz

## 2019-06-23 DIAGNOSIS — E559 Vitamin D deficiency, unspecified: Secondary | ICD-10-CM | POA: Diagnosis not present

## 2019-06-23 DIAGNOSIS — R0600 Dyspnea, unspecified: Secondary | ICD-10-CM

## 2019-06-23 DIAGNOSIS — Z6841 Body Mass Index (BMI) 40.0 and over, adult: Secondary | ICD-10-CM

## 2019-06-23 DIAGNOSIS — Z9189 Other specified personal risk factors, not elsewhere classified: Secondary | ICD-10-CM | POA: Diagnosis not present

## 2019-06-23 DIAGNOSIS — R0609 Other forms of dyspnea: Secondary | ICD-10-CM

## 2019-06-23 DIAGNOSIS — R7303 Prediabetes: Secondary | ICD-10-CM

## 2019-06-23 MED ORDER — VITAMIN D (ERGOCALCIFEROL) 1.25 MG (50000 UNIT) PO CAPS: 50000.0000 [IU] | ORAL_CAPSULE | ORAL | 0 refills | Status: DC

## 2019-06-23 NOTE — Progress Notes (Signed)
Office: [phone]  /  Fax: [phone]   HPI:   Chief Complaint: OBESITY Ann Woods is here to discuss her progress with her obesity treatment plan. She is keeping a food journal with 1200 calories and 100 grams of protein and is following her eating plan approximately 100 % of the time. She states she is exercising 0 minutes 0 times per week. Ann Woods's indirect calorimetry today showed her RMR of 2740, which is slightly higher from 2724 on 08/31/18. She has eaten only 1200 calories over the past few weeks, but was not hungry on this amount. She had misunderstood how many calories she was to eat daily. Ann Woods is still eating a vegan diet.  Her weight is (!) 400 lb (181.4 kg) today and has had a weight loss of 4 pounds over a period of 3 weeks since her last visit. She has lost 3 lbs since starting treatment with [place].  Dyspnea with Exercise Ann Woods reports shortness of breath with exertion and seems to be worsening over time with weight gain. She notes getting out of breath sooner with activity than she used to. This has not gotten worse recently.  Vitamin D Deficiency Ann Woods has a diagnosis of vitamin D deficiency. She is currently on vit D, but is not at goal. Her last vitamin D level was 41.7 on 02/03/19. Ann Woods denies nausea, vomiting, or muscle weakness.  At risk for osteopenia and osteoporosis Ann Woods is at higher risk of osteopenia and osteoporosis due to vitamin D deficiency.   Pre-Diabetes Ann Woods has a diagnosis of pre-diabetes based on her elevated Hgb A1c and was informed this puts her at greater risk of developing diabetes. She is not taking metformin currently and continues to work on diet and exercise to decrease risk of diabetes. She denies polyphagia.  Lab Results  Component Value Date   HGBA1C 6.0 (H) 06/23/2019    ASSESSMENT AND PLAN:  Dyspnea on exertion - Plan: Lipid Panel With LDL/HDL Ratio, T3, T4, free, TSH  Vitamin D deficiency - Plan: Vitamin D,  Ergocalciferol, (DRISDOL) 1.25 MG (50000 UT) CAPS capsule, VITAMIN D 25 Hydroxy (Vit-D Deficiency, Fractures), T3, T4, free, TSH  Prediabetes - Plan: Comprehensive metabolic panel, Hemoglobin A1c, Insulin, random  At risk for osteoporosis  Class 3 severe obesity with serious comorbidity and body mass index (BMI) of 60.0 to 69.9 in adult, unspecified obesity type (HCC)  PLAN:  Dyspnea with exercise Ann Woods's shortness of breath appears to be obesity related and exercise induced. The indirect calorimeter results showed VO2 of 394 and a REE of 2740. She has agreed to work on weight loss and gradually increase exercise to treat her exercise induced shortness of breath. If Ann Woods follows our instructions and loses weight without improvement of her shortness of breath, we will plan to refer to pulmonology. We performed an indirect calorimetry today. A thyroid panel and a FLP will be ordered today. Ann Woods agrees to this plan and will follow up in 3 weeks.  Vitamin D Deficiency Ann Woods was informed that low vitamin D levels contribute to fatigue and are associated with obesity, breast, and colon cancer. Ann Woods agrees to continue to take prescription Vit D @50 ,000 IU every week #4 with no refills and will follow up for routine testing of vitamin D, at least 2-3 times per year. She was informed of the risk of over-replacement of vitamin D and agrees to not increase her dose unless she discusses this with [place] first. Ann Woods agrees to follow up in 3 weeks as directed.  At risk for osteopenia and osteoporosis Ann Woods was given extended (15 minutes) osteoporosis prevention counseling today. Ann Woods is at risk for osteopenia and osteoporosis due to her vitamin D deficiency. She was encouraged to take her vitamin D and follow her higher calcium diet and increase strengthening exercise to help strengthen her bones and decrease her risk of osteopenia and osteoporosis.  Pre-Diabetes Ann Woods will continue to work  on weight loss, exercise, and decreasing simple carbohydrates in her diet to help decrease the risk of diabetes. . An A1c, fasting insulin, and glucose level were ordered today. Ann Woods agreed to follow up with [place] as directed to monitor her progress in 3 weeks.   Obesity Ann Woods is currently in the action stage of change. As such, her goal is to continue with weight loss efforts. She has agreed to keep a food journal with 1800 to 1900 calories and 90 to 100 grams of protein daily. Ann Woods has been instructed to work up to a goal of 150 minutes of combined cardio and strengthening exercise per week for weight loss and overall health benefits. We discussed the following Behavioral Modification Strategies today: increasing lean protein intake, planning for success, and decreasing simple carbohydrates.   Ann Woods has agreed to follow up with our clinic in 3 weeks. She was informed of the importance of frequent follow up visits to maximize her success with intensive lifestyle modifications for her multiple health conditions.  ALLERGIES: No Known Allergies  MEDICATIONS: Current Outpatient Medications on File Prior to Visit  Medication Sig Dispense Refill  . albuterol (VENTOLIN HFA) 108 (90 Base) MCG/ACT inhaler Inhale 2 puffs into the lungs every 6 (six) hours as needed for wheezing or shortness of breath.    [name] amLODipine (NORVASC) 5 MG tablet Take 1 tablet (5 mg total) by mouth daily. 30 tablet 0  . azelastine (ASTELIN) 0.1 % nasal spray Use 1 spray in each nostril twice daily 30 mL 5  . azelastine (OPTIVAR) 0.05 % ophthalmic solution Use 1 drop in each eye twice daily as needed 6 mL 5  . cetirizine (ZYRTEC) 10 MG tablet Take 10 mg by mouth daily.    [name] levocetirizine (XYZAL) 5 MG tablet Take 5 mg by mouth every evening.    . montelukast (SINGULAIR) 10 MG tablet Take 1 tablet (10 mg total) by mouth at bedtime. 30 tablet 5  . Olopatadine HCl (PAZEO) 0.7 % SOLN Place 1 drop into both eyes daily. 2.5  mL 5  . [name] (CYANOCOBALAMIN) 500 MCG tablet Take 500 mcg by mouth daily.    [name] zinc gluconate 50 MG tablet Take 50 mg by mouth daily.    . ferrous sulfate 325 (65 FE) MG tablet Take 1 tablet (325 mg total) by mouth daily with breakfast. 30 tablet 0   [name].     PAST MEDICAL HISTORY: Past Medical History:  Diagnosis Date  . Anemia   . Hepatitis B   . High cholesterol   . Hypertension    G2 - preeclampsia, CHTN  . Infertility, female   . Lactose intolerance   . Obesity   . Obesity   . Prediabetes 04/01/2018  . Seasonal allergies   . Shortness of breath   . Swelling of both lower extremities   . Urticaria     PAST SURGICAL HISTORY: Past Surgical History:  Procedure Laterality Date  . CESAREAN SECTION    . HIP SURGERY    . TUBAL LIGATION Bilateral 05/05/2014  Procedure: POST PARTUM TUBAL LIGATION;  Surgeon: Donnamae Jude, MD;  Location: [facility];  Service: Gynecology;  Laterality: Bilateral;    SOCIAL HISTORY: Social History   Tobacco Use  . Smoking status: Former Smoker    Years: 0.50    Types: Cigarettes    Quit date: 08/25/1993    Years since quitting: 25.8  . Smokeless tobacco: Never Used  . Tobacco comment: one pack of cigarettes weekly- 05/27/18  Substance Use Topics  . Alcohol use: Not Currently  . Drug use: No    FAMILY HISTORY: Family History  Problem Relation Age of Onset  . Heart disease Mother   . Thyroid disease Mother   . High blood pressure Mother   . Obesity Mother   . COPD Father   . Allergic rhinitis Neg Hx   . Angioedema Neg Hx   . Asthma Neg Hx   . Eczema Neg Hx   . Immunodeficiency Neg Hx   . Urticaria Neg Hx     ROS: Review of Systems  Constitutional: Positive for weight loss.  Respiratory: Positive for shortness of breath ( with exertion).   Endo/Heme/Allergies:       Negative for polyphagia.    PHYSICAL EXAM: Blood pressure 135/83, pulse 70, temperature 97.7  F (36.5 C), height 5\' 5"  (1.651 m), weight (!) 400 lb (181.4 kg), SpO2 98 %. Body mass index is 66.56 kg/m. Physical Exam Vitals signs reviewed.  Constitutional:      Appearance: Normal appearance. She is obese.  Cardiovascular:     Rate and Rhythm: Normal rate.  Pulmonary:     Effort: Pulmonary effort is normal.  Musculoskeletal: Normal range of motion.  Skin:    General: Skin is warm and dry.  Neurological:     Mental Status: She is alert and oriented to person, place, and time.  Psychiatric:        Mood and Affect: Mood normal.        Behavior: Behavior normal.     RECENT LABS AND TESTS: BMET    Component Value Date/Time   NA 141 01/13/2019 1012   NA 142 08/31/2018 1256   K 4.6 01/13/2019 1012   CL 104 01/13/2019 1012   CO2 28 01/13/2019 1012   GLUCOSE 92 01/13/2019 1012   BUN 10 01/13/2019 1012   BUN 4 (L) 08/31/2018 1256   CREATININE 0.84 01/13/2019 1012   CALCIUM 9.3 01/13/2019 1012   GFRNONAA 86 01/13/2019 1012   GFRAA 99 01/13/2019 1012   Lab Results  Component Value Date   HGBA1C 5.8 (H) 02/03/2019   HGBA1C 5.7 (H) 08/31/2018   HGBA1C 5.7 (H) 03/02/2018   Lab Results  Component Value Date   INSULIN 12.7 02/03/2019   INSULIN 10.4 08/31/2018   CBC    Component Value Date/Time   WBC 3.6 02/03/2019 1146   WBC 3.6 (L) 05/27/2018 1144   RBC 4.17 02/03/2019 1146   RBC 5.14 (H) 05/27/2018 1144   HGB 10.4 (L) 02/03/2019 1146   HGB 10.4 11/21/2013   HCT 31.9 (L) 02/03/2019 1146   HCT 33 11/21/2013   PLT 326.0 05/27/2018 1144   PLT 312 05/05/2018 1009   PLT 306 11/21/2013   MCV 77 (L) 02/03/2019 1146   MCH 24.9 (L) 02/03/2019 1146   MCH 18.8 (L) 02/11/2018 0314   MCHC 32.6 02/03/2019 1146   MCHC 31.0 05/27/2018 1144   RDW 16.4 (H) 02/03/2019 1146   LYMPHSABS 1.5 02/03/2019 1146   MONOABS 0.5 05/27/2018  1144   EOSABS 0.1 02/03/2019 1146   BASOSABS 0.0 02/03/2019 1146   Iron/TIBC/Ferritin/ %Sat    Component Value Date/Time   IRON 34  02/03/2019 1146   TIBC 355 02/03/2019 1146   FERRITIN 23 02/03/2019 1146   IRONPCTSAT 10 (L) 02/03/2019 1146   Lipid Panel     Component Value Date/Time   CHOL 160 03/02/2018 0954   TRIG 75 03/02/2018 0954   HDL 48 03/02/2018 0954   CHOLHDL 3.3 03/02/2018 0954   CHOLHDL 3.8 Ratio 05/07/2009 2031   VLDL 14 05/07/2009 2031   LDLCALC 97 03/02/2018 0954   Hepatic Function Panel     Component Value Date/Time   PROT 7.1 01/13/2019 1012   PROT 7.2 08/31/2018 1256   ALBUMIN 4.1 08/31/2018 1256   AST 16 01/13/2019 1012   ALT 10 01/13/2019 1012   ALT 15 06/17/2018 0939   ALKPHOS 96 08/31/2018 1256   BILITOT 0.5 01/13/2019 1012   BILITOT 0.9 08/31/2018 1256      Component Value Date/Time   TSH 3.140 03/02/2018 0954   TSH 1.950 02/17/2018 1631   TSH 1.511 05/07/2009 2031   Results for MARGEL, TALAMANTES (MRN [id number]) as of 06/23/2019 11:41  Ref. Range 02/03/2019 11:46  Vitamin D, 25-Hydroxy Latest Ref Range: 30.0 - 100.0 ng/mL 41.7   OBESITY BEHAVIORAL INTERVENTION VISIT  Today's visit was # 18  Starting weight: 403 lbs Starting date: 08/31/2018 Today's weight : Weight: (!) 400 lb (181.4 kg)  Today's date: 06/23/2019 Total lbs lost to date: 3    06/23/2019  Height 5\' 5"  (1.651 m)  Weight 400 lb (181.4 kg) (A)  BMI (Calculated) 66.56  BLOOD PRESSURE - SYSTOLIC [id number]  BLOOD PRESSURE - DIASTOLIC 83   Body Fat % 123XX123 %  RMR 2740   ASK: We discussed the diagnosis of obesity with Ann Woods today and Ann Woods agreed to give [place] permission to discuss obesity behavioral modification therapy today.  ASSESS: Ann Woods has the diagnosis of obesity and her BMI today is 66.56. Ann Woods is in the action stage of change.   ADVISE: Kamarii was educated on the multiple health risks of obesity as well as the benefit of weight loss to improve her health. She was advised of the need for long term treatment and the importance of lifestyle modifications to improve her current health  and to decrease her risk of future health problems.  AGREE: Multiple dietary modification options and treatment options were discussed and Ann Woods agreed to follow the recommendations documented in the above note.  ARRANGE: Mittie was educated on the importance of frequent visits to treat obesity as outlined per CMS and USPSTF guidelines and agreed to schedule her next follow up appointment today.  Lenward Chancellor, CMA, am acting as Location manager for [employer], FNP-C I have reviewed the above documentation for accuracy and completeness, and I agree with the above.  - Raiyah Speakman, FNP-C.

## 2019-06-24 ENCOUNTER — Encounter (INDEPENDENT_AMBULATORY_CARE_PROVIDER_SITE_OTHER): Payer: Self-pay | Admitting: Family Medicine

## 2019-06-24 LAB — COMPREHENSIVE METABOLIC PANEL
ALT: 9 IU/L (ref 0–32)
AST: 11 IU/L (ref 0–40)
Albumin/Globulin Ratio: 1.3 (ref 1.2–2.2)
Albumin: 3.9 g/dL (ref 3.8–4.8)
Alkaline Phosphatase: 96 IU/L (ref 39–117)
BUN/Creatinine Ratio: 14 (ref 9–23)
BUN: 11 mg/dL (ref 6–24)
Bilirubin Total: 0.4 mg/dL (ref 0.0–1.2)
CO2: 25 mmol/L (ref 20–29)
Calcium: 9.1 mg/dL (ref 8.7–10.2)
Chloride: 104 mmol/L (ref 96–106)
Creatinine, Ser: 0.76 mg/dL (ref 0.57–1.00)
GFR calc Af Amer: 111 mL/min/{1.73_m2} (ref >59)
GFR calc non Af Amer: 96 mL/min/{1.73_m2} (ref >59)
Globulin, Total: 2.9 g/dL (ref 1.5–4.5)
Glucose: 91 mg/dL (ref 65–99)
Potassium: 4.6 mmol/L (ref 3.5–5.2)
Sodium: 141 mmol/L (ref 134–144)
Total Protein: 6.8 g/dL (ref 6.0–8.5)

## 2019-06-24 LAB — LIPID PANEL WITH LDL/HDL RATIO
Cholesterol, Total: 158 mg/dL (ref 100–199)
HDL: 41 mg/dL (ref >39)
LDL Chol Calc (NIH): 105 mg/dL — ABNORMAL HIGH (ref 0–99)
LDL/HDL Ratio: 2.6 ratio (ref 0.0–3.2)
Triglycerides: 57 mg/dL (ref 0–149)
VLDL Cholesterol Cal: 12 mg/dL (ref 5–40)

## 2019-06-24 LAB — INSULIN, RANDOM: INSULIN: 16 u[IU]/mL (ref 2.6–24.9)

## 2019-06-24 LAB — VITAMIN D 25 HYDROXY (VIT D DEFICIENCY, FRACTURES): Vit D, 25-Hydroxy: 26.9 ng/mL — ABNORMAL LOW (ref 30.0–100.0)

## 2019-06-24 LAB — HEMOGLOBIN A1C
Est. average glucose Bld gHb Est-mCnc: 126 mg/dL
Hgb A1c MFr Bld: 6 % — ABNORMAL HIGH (ref 4.8–5.6)

## 2019-06-24 LAB — T3: T3, Total: 143 ng/dL (ref 71–180)

## 2019-06-24 LAB — T4, FREE: Free T4: 0.96 ng/dL (ref 0.82–1.77)

## 2019-06-24 LAB — TSH: TSH: 1.52 u[IU]/mL (ref 0.450–4.500)

## 2019-07-14 ENCOUNTER — Ambulatory Visit (INDEPENDENT_AMBULATORY_CARE_PROVIDER_SITE_OTHER): Payer: Managed Care, Other (non HMO) | Admitting: Family Medicine

## 2019-07-14 ENCOUNTER — Other Ambulatory Visit: Payer: Self-pay

## 2019-07-14 ENCOUNTER — Encounter (INDEPENDENT_AMBULATORY_CARE_PROVIDER_SITE_OTHER): Payer: Self-pay | Admitting: Family Medicine

## 2019-07-14 VITALS — BP 135/84 | HR 74 | Temp 97.5°F | Ht 65.0 in | Wt 399.0 lb

## 2019-07-14 DIAGNOSIS — Z6841 Body Mass Index (BMI) 40.0 and over, adult: Secondary | ICD-10-CM

## 2019-07-14 DIAGNOSIS — Z9189 Other specified personal risk factors, not elsewhere classified: Secondary | ICD-10-CM

## 2019-07-14 DIAGNOSIS — R7303 Prediabetes: Secondary | ICD-10-CM | POA: Diagnosis not present

## 2019-07-14 DIAGNOSIS — E559 Vitamin D deficiency, unspecified: Secondary | ICD-10-CM

## 2019-07-14 DIAGNOSIS — I10 Essential (primary) hypertension: Secondary | ICD-10-CM

## 2019-07-14 MED ORDER — AMLODIPINE BESYLATE 5 MG PO TABS: 5.0000 mg | ORAL_TABLET | Freq: Every day | ORAL | 0 refills | Status: DC

## 2019-07-14 MED ORDER — VITAMIN D (ERGOCALCIFEROL) 1.25 MG (50000 UNIT) PO CAPS: 50000.0000 [IU] | ORAL_CAPSULE | ORAL | 0 refills | Status: DC

## 2019-07-18 NOTE — Progress Notes (Signed)
Office: [phone]  /  Fax: [phone]   HPI:   Chief Complaint: OBESITY Ann Woods is here to discuss her progress with her obesity treatment plan. She is on the keep a food journal with 1800 to 1900 calories and 90 to 100 grams of protein daily and she is following her eating plan approximately 75 % of the time. She states she is walking 20 minutes 2 times per week. Ann Woods is still eating vegan and reports she does get in enough protein. She is still not eating enough calories. Ann Woods is eating tofu, beans, and veggie burgers. She does not know how many grams of protein she is eating daily. Ann Woods does admit to eating some sweets. Her weight is (!) 399 lb (181 kg) today and has had a weight loss of 1 pound over a period of 3 weeks since her last visit. She has lost 4 lbs since starting treatment with [place].  Vitamin D deficiency Ahlanna has a diagnosis of vitamin D deficiency. She is not at goal, despite weekly prescription vit D. Her vitamin D level has decreased. Shelene admits fatigue and she denies nausea, vomiting or muscle weakness.  Hypertension Ann Woods is a 43 y.o. female with hypertension. Her blood pressure runs 130's/80's at home, and it is slightly high today; but generally it is well controlled. Ann Woods denies chest pain or shortness of breath on exertion. She is working weight loss to help control her blood pressure with the goal of decreasing her risk of heart attack and stroke.  BP Readings from Last 3 Encounters:  07/14/19 135/84  06/23/19 135/83  06/02/19 (!) 140/94     Pre-Diabetes Ann Woods has a diagnosis of prediabetes based on her elevated Hgb A1c and was informed this puts her at greater risk of developing diabetes. Her A1c increased to 6.0 from 5.7 in January 2020. She has been eating vegan for six months. She does admit to eating some sweets and simple carbohydrates. Ann Woods is not taking metformin currently and she continues to work on diet  and exercise to decrease risk of diabetes. She denies nausea or hypoglycemia.  At risk for cardiovascular disease Ann Woods is at a higher than average risk for cardiovascular disease due to obesity, hypertension and prediabetes. She currently denies any chest pain.  ASSESSMENT AND PLAN:  Vitamin D deficiency - Plan: Vitamin D, Ergocalciferol, (DRISDOL) 1.25 MG (50000 UT) CAPS capsule  Essential hypertension - Plan: amLODipine (NORVASC) 5 MG tablet  At risk for heart disease  Prediabetes  Class 3 severe obesity with serious comorbidity and body mass index (BMI) of 60.0 to 69.9 in adult, unspecified obesity type ([place])  PLAN:  Vitamin D Deficiency Jarrica was informed that low vitamin D levels contributes to fatigue and are associated with obesity, breast, and colon cancer. Piper agrees to increase  The dose of prescription Vit D 50,000 IU to every 3 days #10 with no refills and she will follow up for routine testing of vitamin D, at least 2-3 times per year. She was informed of the risk of over-replacement of vitamin D and agrees to not increase her dose unless she discusses this with [place] first. Skylah agrees to follow up with our clinic in 3 weeks.  Hypertension We discussed sodium restriction, working on healthy weight loss, and a regular exercise program as the means to achieve improved blood pressure control. Roben agreed with this plan and agreed to follow up as directed. We will continue to monitor her blood pressure as  well as her progress with the above lifestyle modifications. Rosaland agrees to continue Norvasc 5 mg daily #30 with no refills and continue to check blood pressure a few times a week.   Pre-Diabetes Ann Woods will continue to work on weight loss, exercise, and decreasing simple carbohydrates in her diet to help decrease the risk of diabetes. We dicussed metformin including benefits and risks and she will think about this. She was informed that eating too many simple  carbohydrates or too many calories at one sitting increases the likelihood of GI side effects. Ann Woods agreed to follow up with [place] as directed to monitor her progress.  Cardiovascular risk counseling Ann Woods was given extended (15 minutes) coronary artery disease prevention counseling today. She is 43 y.o. female and has risk factors for heart disease including obesity, hypertension and prediabetes. We discussed intensive lifestyle modifications today with an emphasis on specific weight loss instructions and strategies. Pt was also informed of the importance of increasing exercise and decreasing saturated fats to help prevent heart disease.  Obesity Ann Woods is currently in the action stage of change. As such, her goal is to continue with weight loss efforts She has agreed to keep a food journal with 1800 to 1900 calories and 90 to 100 grams of protein daily Ann Woods has been instructed to work up to a goal of 150 minutes of combined cardio and strengthening exercise per week for weight loss and overall health benefits. We discussed the following Behavioral Modification Strategies today: planning for success, better snacking choices, increasing lean protein intake and decreasing simple carbohydrates  We discussed increasing calories to 1800 daily. Handout for food labels was given to patient.  Ann Woods has agreed to follow up with our clinic in 3 weeks. She was informed of the importance of frequent follow up visits to maximize her success with intensive lifestyle modifications for her multiple health conditions.  ALLERGIES: No Known Allergies  MEDICATIONS: Current Outpatient Medications on File Prior to Visit  Medication Sig Dispense Refill   albuterol (VENTOLIN HFA) 108 (90 Base) MCG/ACT inhaler Inhale 2 puffs into the lungs every 6 (six) hours as needed for wheezing or shortness of breath.     azelastine (ASTELIN) 0.1 % nasal spray Use 1 spray in each nostril twice daily 30 mL 5    azelastine (OPTIVAR) 0.05 % ophthalmic solution Use 1 drop in each eye twice daily as needed 6 mL 5   cetirizine (ZYRTEC) 10 MG tablet Take 10 mg by mouth daily.     levocetirizine (XYZAL) 5 MG tablet Take 5 mg by mouth every evening.     montelukast (SINGULAIR) 10 MG tablet Take 1 tablet (10 mg total) by mouth at bedtime. 30 tablet 5   Olopatadine HCl (PAZEO) 0.7 % SOLN Place 1 drop into both eyes daily. 2.5 mL 5   [name] (CYANOCOBALAMIN) 500 MCG tablet Take 500 mcg by mouth daily.     zinc gluconate 50 MG tablet Take 50 mg by mouth daily.     ferrous sulfate 325 (65 FE) MG tablet Take 1 tablet (325 mg total) by mouth daily with breakfast. 30 tablet 0   [name].     PAST MEDICAL HISTORY: Past Medical History:  Diagnosis Date   Anemia    Hepatitis B    High cholesterol    Hypertension    G2 - preeclampsia, CHTN   Infertility, female    Lactose intolerance    Obesity  Obesity    Prediabetes 04/01/2018   Seasonal allergies    Shortness of breath    Swelling of both lower extremities    Urticaria     PAST SURGICAL HISTORY: Past Surgical History:  Procedure Laterality Date   CESAREAN SECTION     HIP SURGERY     TUBAL LIGATION Bilateral 05/05/2014   Procedure: POST PARTUM TUBAL LIGATION;  Surgeon: Donnamae Jude, MD;  Location: [facility];  Service: Gynecology;  Laterality: Bilateral;    SOCIAL HISTORY: Social History   Tobacco Use   Smoking status: Former Smoker    Years: 0.50    Types: Cigarettes    Quit date: 08/25/1993    Years since quitting: 25.9   Smokeless tobacco: Never Used   Tobacco comment: one pack of cigarettes weekly- 05/27/18  Substance Use Topics   Alcohol use: Not Currently   Drug use: No    FAMILY HISTORY: Family History  Problem Relation Age of Onset   Heart disease Mother    Thyroid disease Mother    High blood pressure Mother    Obesity Mother     COPD Father    Allergic rhinitis Neg Hx    Angioedema Neg Hx    Asthma Neg Hx    Eczema Neg Hx    Immunodeficiency Neg Hx    Urticaria Neg Hx     ROS: Review of Systems  Constitutional: Positive for malaise/fatigue and weight loss.  Respiratory: Negative for shortness of breath (on exertion).   Cardiovascular: Negative for chest pain.  Gastrointestinal: Negative for nausea and vomiting.  Musculoskeletal:       Negative for muscle weakness  Endo/Heme/Allergies:       Negative for hypoglycemia    PHYSICAL EXAM: Blood pressure 135/84, pulse 74, temperature (!) 97.5 F (36.4 C), temperature source Oral, height 5\' 5"  (1.651 m), weight (!) 399 lb (181 kg), SpO2 98 %. Body mass index is 66.4 kg/m. Physical Exam Vitals signs reviewed.  Constitutional:      Appearance: Normal appearance. She is well-developed. She is obese.  Cardiovascular:     Rate and Rhythm: Normal rate.  Pulmonary:     Effort: Pulmonary effort is normal.  Musculoskeletal: Normal range of motion.  Skin:    General: Skin is warm and dry.  Neurological:     Mental Status: She is alert and oriented to person, place, and time.  Psychiatric:        Mood and Affect: Mood normal.        Behavior: Behavior normal.     RECENT LABS AND TESTS: BMET    Component Value Date/Time   NA 141 06/23/2019 1046   K 4.6 06/23/2019 1046   CL 104 06/23/2019 1046   CO2 25 06/23/2019 1046   GLUCOSE 91 06/23/2019 1046   GLUCOSE 92 01/13/2019 1012   BUN 11 06/23/2019 1046   CREATININE 0.76 06/23/2019 1046   CREATININE 0.84 01/13/2019 1012   CALCIUM 9.1 06/23/2019 1046   GFRNONAA 96 06/23/2019 1046   GFRNONAA 86 01/13/2019 1012   GFRAA 111 06/23/2019 1046   GFRAA 99 01/13/2019 1012   Lab Results  Component Value Date   HGBA1C 6.0 (H) 06/23/2019   HGBA1C 5.8 (H) 02/03/2019   HGBA1C 5.7 (H) 08/31/2018   HGBA1C 5.7 (H) 03/02/2018   Lab Results  Component Value Date   INSULIN 16.0 06/23/2019   INSULIN 12.7  02/03/2019   INSULIN 10.4 08/31/2018   CBC    Component Value Date/Time  WBC 3.6 02/03/2019 1146   WBC 3.6 (L) 05/27/2018 1144   RBC 4.17 02/03/2019 1146   RBC 5.14 (H) 05/27/2018 1144   HGB 10.4 (L) 02/03/2019 1146   HGB 10.4 11/21/2013   HCT 31.9 (L) 02/03/2019 1146   HCT 33 11/21/2013   PLT 326.0 05/27/2018 1144   PLT 312 05/05/2018 1009   PLT 306 11/21/2013   MCV 77 (L) 02/03/2019 1146   MCH 24.9 (L) 02/03/2019 1146   MCH 18.8 (L) 02/11/2018 0314   MCHC 32.6 02/03/2019 1146   MCHC 31.0 05/27/2018 1144   RDW 16.4 (H) 02/03/2019 1146   LYMPHSABS 1.5 02/03/2019 1146   MONOABS 0.5 05/27/2018 1144   EOSABS 0.1 02/03/2019 1146   BASOSABS 0.0 02/03/2019 1146   Iron/TIBC/Ferritin/ %Sat    Component Value Date/Time   IRON 34 02/03/2019 1146   TIBC 355 02/03/2019 1146   FERRITIN 23 02/03/2019 1146   IRONPCTSAT 10 (L) 02/03/2019 1146   Lipid Panel     Component Value Date/Time   CHOL 158 06/23/2019 1046   TRIG 57 06/23/2019 1046   HDL 41 06/23/2019 1046   CHOLHDL 3.3 03/02/2018 0954   CHOLHDL 3.8 Ratio 05/07/2009 2031   VLDL 14 05/07/2009 2031   LDLCALC 105 (H) 06/23/2019 1046   Hepatic Function Panel     Component Value Date/Time   PROT 6.8 06/23/2019 1046   ALBUMIN 3.9 06/23/2019 1046   AST 11 06/23/2019 1046   ALT 9 06/23/2019 1046   ALT 15 06/17/2018 0939   ALKPHOS 96 06/23/2019 1046   BILITOT 0.4 06/23/2019 1046      Component Value Date/Time   TSH 1.520 06/23/2019 1046   TSH 3.140 03/02/2018 0954   TSH 1.950 02/17/2018 1631     Ref. Range 06/23/2019 10:46  Vitamin D, 25-Hydroxy Latest Ref Range: 30.0 - 100.0 ng/mL 26.9 (L)    OBESITY BEHAVIORAL INTERVENTION VISIT  Today's visit was # 19   Starting weight: 403 lbs Starting date: 08/31/2018 Today's weight : 399 lbs Today's date: 07/14/2019 Total lbs lost to date: 4    07/14/2019  Height 5\' 5"  (1.651 m)  Weight 399 lb (181 kg) (A)  BMI (Calculated) 66.4  BLOOD PRESSURE - SYSTOLIC [id number]    BLOOD PRESSURE - DIASTOLIC 84   Body Fat % [id number] %    ASK: We discussed the diagnosis of obesity with [name] today and Nyanna agreed to give [place] permission to discuss obesity behavioral modification therapy today.  ASSESS: Cherine has the diagnosis of obesity and her BMI today is 69.4 Preethi is in the action stage of change   ADVISE: Niomi was educated on the multiple health risks of obesity as well as the benefit of weight loss to improve her health. She was advised of the need for long term treatment and the importance of lifestyle modifications to improve her current health and to decrease her risk of future health problems.  AGREE: Multiple dietary modification options and treatment options were discussed and  Isaura agreed to follow the recommendations documented in the above note.  ARRANGE: Kynsleigh was educated on the importance of frequent visits to treat obesity as outlined per CMS and USPSTF guidelines and agreed to schedule her next follow up appointment today.  I, Doreene Nest, am acting as transcriptionist for [employer], FNP-C  I have reviewed the above documentation for accuracy and completeness, and I agree with the above.  - Vertis Bauder, FNP-C.

## 2019-07-19 ENCOUNTER — Encounter (INDEPENDENT_AMBULATORY_CARE_PROVIDER_SITE_OTHER): Payer: Self-pay | Admitting: Family Medicine

## 2019-08-01 ENCOUNTER — Ambulatory Visit (INDEPENDENT_AMBULATORY_CARE_PROVIDER_SITE_OTHER): Payer: Managed Care, Other (non HMO) | Admitting: Podiatry

## 2019-08-01 ENCOUNTER — Other Ambulatory Visit: Payer: Self-pay

## 2019-08-01 ENCOUNTER — Encounter: Payer: Self-pay | Admitting: Podiatry

## 2019-08-01 VITALS — BP 186/105

## 2019-08-01 DIAGNOSIS — B353 Tinea pedis: Secondary | ICD-10-CM

## 2019-08-01 DIAGNOSIS — B351 Tinea unguium: Secondary | ICD-10-CM | POA: Diagnosis not present

## 2019-08-01 MED ORDER — CLOTRIMAZOLE 1 % EX CREA: 1.0000 "application " | TOPICAL_CREAM | Freq: Two times a day (BID) | CUTANEOUS | 0 refills | Status: DC

## 2019-08-02 ENCOUNTER — Encounter: Payer: Self-pay | Admitting: Podiatry

## 2019-08-02 LAB — HEPATIC FUNCTION PANEL
AG Ratio: 1.2 (calc) (ref 1.0–2.5)
ALT: 13 U/L (ref 6–29)
AST: 16 U/L (ref 10–30)
Albumin: 3.7 g/dL (ref 3.6–5.1)
Alkaline phosphatase (APISO): 69 U/L (ref 31–125)
Bilirubin, Direct: 0.4 mg/dL — ABNORMAL HIGH (ref 0.0–0.2)
Globulin: 3.2 g/dL (calc) (ref 1.9–3.7)
Indirect Bilirubin: 0.5 mg/dL (calc) (ref 0.2–1.2)
Total Bilirubin: 0.9 mg/dL (ref 0.2–1.2)
Total Protein: 6.9 g/dL (ref 6.1–8.1)

## 2019-08-02 MED ORDER — TERBINAFINE HCL 250 MG PO TABS: 250.0000 mg | ORAL_TABLET | Freq: Every day | ORAL | 0 refills | Status: DC

## 2019-08-02 NOTE — Progress Notes (Signed)
Subjective:  Patient ID: Ann Woods, female    DOB: 1976-06-13,  MRN: [id number]  Chief Complaint  Patient presents with  . Nail Problem    pt is here for right great toenail fungus, pt also has no other comments or concerns    43 y.o. female presents with the above complaint.  Patient is a diabetic with last A1c of 6.0.  Patient states that she has right hallux nail that is thick dystrophic painful and she is concerned that she has fungus in it.  She also has secondary complaint of bilateral athlete's foot with associated epidermal lysis of the superficial skin and itching associated with it.  She states the onychomycosis has been going on for years and has progressively gotten worse.  She denies taking care of it she denies doing anything for it and would like some sort of treatment options at this time.  She also states that her bilateral athlete's foot has also been causing a lot of issues over the long period of time she has not tried anything except some over-the-counter powder.  She denies any other acute complaints at this time.   Review of Systems: Negative except as noted in the HPI. Denies N/V/F/Ch.  Past Medical History:  Diagnosis Date  . Anemia   . Hepatitis B   . High cholesterol   . Hypertension    G2 - preeclampsia, CHTN  . Infertility, female   . Lactose intolerance   . Obesity   . Obesity   . Prediabetes 04/01/2018  . Seasonal allergies   . Shortness of breath   . Swelling of both lower extremities   . Urticaria     Current Outpatient Medications:  .  albuterol (VENTOLIN HFA) 108 (90 Base) MCG/ACT inhaler, Inhale 2 puffs into the lungs every 6 (six) hours as needed for wheezing or shortness of breath., Disp: , Rfl:  .  amLODipine (NORVASC) 5 MG tablet, Take 1 tablet (5 mg total) by mouth daily., Disp: 30 tablet, Rfl: 0 .  azelastine (ASTELIN) 0.1 % nasal spray, Use 1 spray in each nostril twice daily, Disp: 30 mL, Rfl: 5 .  azelastine (OPTIVAR) 0.05 %  ophthalmic solution, Use 1 drop in each eye twice daily as needed, Disp: 6 mL, Rfl: 5 .  cetirizine (ZYRTEC) 10 MG tablet, Take 10 mg by mouth daily., Disp: , Rfl:  .  levocetirizine (XYZAL) 5 MG tablet, Take 5 mg by mouth every evening., Disp: , Rfl:  .  montelukast (SINGULAIR) 10 MG tablet, Take 1 tablet (10 mg total) by mouth at bedtime., Disp: 30 tablet, Rfl: 5 .  Olopatadine HCl (PAZEO) 0.7 % SOLN, Place 1 drop into both eyes daily., Disp: 2.5 mL, Rfl: 5 .  [name] (CYANOCOBALAMIN) 500 MCG tablet, Take 500 mcg by mouth daily., Disp: , Rfl:  .  Vitamin D, Ergocalciferol, (DRISDOL) 1.25 MG (50000 UT) CAPS capsule, Take 1 capsule (50,000 Units total) by mouth every 3 (three) days., Disp: 10 capsule, Rfl: 0 .  zinc gluconate 50 MG tablet, Take 50 mg by mouth daily., Disp: , Rfl:  .  clotrimazole (LOTRIMIN) 1 % cream, Apply 1 application topically 2 (two) times daily., Disp: 30 g, Rfl: 0 .  ferrous sulfate 325 (65 FE) MG tablet, Take 1 tablet (325 mg total) by mouth daily with breakfast., Disp: 30 tablet, Rfl: 0  Social History   Tobacco Use  Smoking Status Former Smoker  . Years: 0.50  . Types: Cigarettes  . Quit  date: 08/25/1993  . Years since quitting: 25.9  Smokeless Tobacco Never Used  Tobacco Comment   one pack of cigarettes weekly- 05/27/18    No Known Allergies Objective:   Vitals:   08/01/19 1551  BP: (!) 186/105   There is no height or weight on file to calculate BMI. Constitutional Well developed. Well nourished.  Vascular Dorsalis pedis pulses palpable bilaterally. Posterior tibial pulses palpable bilaterally. Capillary refill normal to all digits.  No cyanosis or clubbing noted. Pedal hair growth normal.  Neurologic Normal speech. Oriented to person, place, and time. Epicritic sensation to light touch grossly present bilaterally.  Dermatologic  right hallux dystrophic elongated dark discoloration mycotic toenails noted.  Mild pain on palpation. Bilateral  plantar epidermal lysis with associated itching noted across the entire plantar surface.  Orthopedic: Normal joint ROM without pain or crepitus bilaterally. No visible deformities. No bony tenderness.   Radiographs: None Assessment:   1. Nail fungus   2. Tinea pedis of both feet    Plan:  Patient was evaluated and treated and all questions answered.  Onychomycosis right hallux nail -I explained to the patient the etiology of onychomycosis and various treatment options available to treat it.  I explained to her that there is topical p.o. and laser therapy for the treatment.  She elected to undergo p.o. option for treatment.  I will obtain liver function test and if normal I will start her on Lamisil therapy for next 3 months. -I will see her back in 4 months to see how her toenails are doing.  Bilateral tinea pedis -I explained to the patient the etiology of tinea pedis and treatment options associated with it.  I believe patient will benefit from clotrimazole cream.  Patient states she will obtain that from the pharmacy and will start applying it.  No follow-ups on file.

## 2019-08-09 ENCOUNTER — Encounter (INDEPENDENT_AMBULATORY_CARE_PROVIDER_SITE_OTHER): Payer: Self-pay

## 2019-08-10 ENCOUNTER — Encounter: Payer: Self-pay | Admitting: Allergy

## 2019-08-10 ENCOUNTER — Ambulatory Visit (INDEPENDENT_AMBULATORY_CARE_PROVIDER_SITE_OTHER): Payer: Managed Care, Other (non HMO) | Admitting: Family Medicine

## 2019-08-10 ENCOUNTER — Encounter (INDEPENDENT_AMBULATORY_CARE_PROVIDER_SITE_OTHER): Payer: Self-pay | Admitting: Family Medicine

## 2019-08-10 ENCOUNTER — Ambulatory Visit (INDEPENDENT_AMBULATORY_CARE_PROVIDER_SITE_OTHER): Payer: Managed Care, Other (non HMO) | Admitting: Allergy

## 2019-08-10 ENCOUNTER — Other Ambulatory Visit: Payer: Self-pay

## 2019-08-10 VITALS — BP 140/92 | HR 71 | Temp 97.5°F | Resp 16 | Ht 64.5 in | Wt 396.8 lb

## 2019-08-10 DIAGNOSIS — T781XXD Other adverse food reactions, not elsewhere classified, subsequent encounter: Secondary | ICD-10-CM | POA: Diagnosis not present

## 2019-08-10 DIAGNOSIS — L509 Urticaria, unspecified: Secondary | ICD-10-CM

## 2019-08-10 DIAGNOSIS — R7303 Prediabetes: Secondary | ICD-10-CM | POA: Diagnosis not present

## 2019-08-10 DIAGNOSIS — E559 Vitamin D deficiency, unspecified: Secondary | ICD-10-CM

## 2019-08-10 DIAGNOSIS — Z6841 Body Mass Index (BMI) 40.0 and over, adult: Secondary | ICD-10-CM

## 2019-08-10 DIAGNOSIS — H1013 Acute atopic conjunctivitis, bilateral: Secondary | ICD-10-CM

## 2019-08-10 DIAGNOSIS — J3089 Other allergic rhinitis: Secondary | ICD-10-CM | POA: Diagnosis not present

## 2019-08-10 DIAGNOSIS — T7819XD Other adverse food reactions, not elsewhere classified, subsequent encounter: Secondary | ICD-10-CM

## 2019-08-10 MED ORDER — CETIRIZINE HCL 10 MG PO TABS: 10.0000 mg | ORAL_TABLET | Freq: Two times a day (BID) | ORAL | 5 refills | Status: DC

## 2019-08-10 MED ORDER — FAMOTIDINE 20 MG PO TABS: 20.0000 mg | ORAL_TABLET | Freq: Two times a day (BID) | ORAL | 5 refills | Status: DC

## 2019-08-10 MED ORDER — VITAMIN D (ERGOCALCIFEROL) 1.25 MG (50000 UNIT) PO CAPS: 50000.0000 [IU] | ORAL_CAPSULE | ORAL | 0 refills | Status: DC

## 2019-08-10 NOTE — Progress Notes (Signed)
Follow-up Note  RE: AVANTHIKA Woods MRN: [id number] DOB: 24-Mar-1976 Date of Office Visit: 08/10/2019   History of present illness: Ann Woods is a 43 y.o. female presenting today for follow-up of  She was last seen in the office on 03/10/2019 by myself.    She has been getting hives at night time for the past week.  She has had hives before with last hive outbreak during the summer.  She does have dust mite cover for her mattress but not her pillows.  She is using dye and scent free detergents.  She feels the hives might be related to her boyfriend vaping however vaping is not new for her boyfriend are having exposure to the vape smoke over the past week.  She has changed the filters in her home.  Otherwise she has not had any change in diet.  She denies any stings.  She has not had any new medications.  She has been taking benadryl at night but is drowsy in the morning.   The hives are gone by morning and do not leave behind any marks or bruising.  No associated swelling. She does not eat any meat or dairy in the diet for the past 8 months.  She also continues to avoid shrimp. She does take Zyrtec daily for general allergy symptom control.  She does have allergy relief eyedrops that she uses that do help.  She also has Astelin that she uses for nasal drainage.  She also is taking Singulair that she does feel has helped to better control her allergy symptoms. She has not yet gotten the flu vaccine but she plans to get it soon from her primary care.   Review of systems: Review of Systems  Constitutional: Negative.   HENT: Negative.   Eyes: Negative.   Respiratory: Negative.   Cardiovascular: Negative.   Gastrointestinal: Negative.   Musculoskeletal: Negative.   Skin: Positive for itching and rash.  Neurological: Negative.     All other systems negative unless noted above in HPI  Past medical/social/surgical/family history have been reviewed and are unchanged unless  specifically indicated below.  No changes  Medication List: Current Outpatient Medications  Medication Sig Dispense Refill  . albuterol (VENTOLIN HFA) 108 (90 Base) MCG/ACT inhaler Inhale 2 puffs into the lungs every 6 (six) hours as needed for wheezing or shortness of breath.    [name] amLODipine (NORVASC) 5 MG tablet Take 1 tablet (5 mg total) by mouth daily. 30 tablet 0  . azelastine (ASTELIN) 0.1 % nasal spray Use 1 spray in each nostril twice daily 30 mL 5  . azelastine (OPTIVAR) 0.05 % ophthalmic solution Use 1 drop in each eye twice daily as needed 6 mL 5  . cetirizine (ZYRTEC) 10 MG tablet Take 10 mg by mouth daily.    . clotrimazole (LOTRIMIN) 1 % cream Apply 1 application topically 2 (two) times daily. 30 g 0  . ferrous sulfate 325 (65 FE) MG tablet Take 1 tablet (325 mg total) by mouth daily with breakfast. 30 tablet 0  . levocetirizine (XYZAL) 5 MG tablet Take 5 mg by mouth every evening.    . montelukast (SINGULAIR) 10 MG tablet Take 1 tablet (10 mg total) by mouth at bedtime. 30 tablet 5  . Olopatadine HCl (PAZEO) 0.7 % SOLN Place 1 drop into both eyes daily. 2.5 mL 5  . terbinafine (LAMISIL) 250 MG tablet Take 1 tablet (250 mg total) by mouth daily. 90 tablet 0  .  [name] (CYANOCOBALAMIN) 500 MCG tablet Take 500 mcg by mouth daily.    . Vitamin D, Ergocalciferol, (DRISDOL) 1.25 MG (50000 UT) CAPS capsule Take 1 capsule (50,000 Units total) by mouth every 3 (three) days. 10 capsule 0  . zinc gluconate 50 MG tablet Take 50 mg by mouth daily.     No current facility-administered medications for this visit.     Known medication allergies: No Known Allergies   Physical examination: Blood pressure (!) 140/92, pulse 71, temperature (!) 97.5 F (36.4 C), temperature source Temporal, resp. rate 16, height 5' 4.5" (1.638 m), weight (!) 396 lb 12.8 oz (180 kg), SpO2 98 %.  General: Alert, interactive, in no acute distress. HEENT: PERRLA, TMs pearly gray, turbinates moderately  edematous without discharge, post-pharynx non erythematous. Neck: Supple without lymphadenopathy. Lungs: Clear to auscultation without wheezing, rhonchi or rales. {no increased work of breathing. CV: Normal S1, S2 without murmurs. Abdomen: Nondistended, nontender. Skin: Warm and dry, without lesions or rashes. Extremities:  No clubbing, cyanosis or edema. Neuro:   Grossly intact.  Diagnositics/Labs: Labs:  Component     Latest Ref Rng & Units 08/01/2019  Total Protein     6.1 - 8.1 g/dL 6.9  Albumin MSPROF     3.6 - 5.1 g/dL 3.7  Globulin     1.9 - 3.7 g/dL (calc) 3.2  AG Ratio     1.0 - 2.5 (calc) 1.2  Total Bilirubin     0.2 - 1.2 mg/dL 0.9  Bilirubin, Direct     0.0 - 0.2 mg/dL 0.4 (H)  Indirect Bilirubin     0.2 - 1.2 mg/dL (calc) 0.5  Alkaline phosphatase (APISO)     31 - 125 U/L 69  AST     10 - 30 U/L 16  ALT     6 - 29 U/L 13    Assessment and plan: Urticaria  - at this time etiology of hives and swelling is unknown.  Hives can be caused by a variety of different triggers including illness/infection, foods, medications, stings, exercise, pressure, vibrations, extremes of temperature to name a few however majority of the time there is no identifiable trigger.  Will obtain labwork to evaluate: CBC w diff, BMP, tryptase, hive panel.  - for hive control recommend use of high-dose antihistamine regimen:   Cetirizine (Zyrtec) 10mg  twice a day and famotidine 20 mg (Pepcid) twice a day. If no symptoms for 7-14 days then decrease to.  Cetirizine (Zyrtec) 10mg  twice a day and famotidine 20 mg (Pepcid) once a day.  If no symptoms for 7-14 days then decrease to.  Cetirizine (Zyrtec) 10mg  twice a day.  If no symptoms for 7-14 days then decrease to.  Cetirizine (Zyrtec) 10mg  once a day.        If symptoms return, then step up dosage  Allergic rhinitis with conjunctivitis  - continue avoidance measures for grass pollens, tree pollens, weed pollens, molds, dust mites, cat,  dog, mixed feathers (ie. Down), cockroach, mouse  - for general allergy symptom control continue Xyzal 5mg  or Zyrtec 10mg  daily as needed.    - for itchy, watery, red eyes use Pazeo eye drop 1 drop each daily as needed  - for nasal congestion continue use of Flonase 2 sprays each nostril daily for 1-2 weeks at a time before stopping once improved  - for nasal drainage/post-nasal drip and throat clearing continue use of nasal antihistamine, Astelin 2 sprays each nostril twice a day as needed  - continue  Singulair 10mg  daily - take at bedtime  - allergen immunotherapy discussed today including protocol, benefits and risk.  Informational handout provided.  If interested in this therapuetic option you can check with your insurance carrier for coverage.  Let us know if you would like to proceed with this option.    - recommend wearing a mask with seal (like a N95 mask) while performing yard-work or cutting the grass to decrease inhalation of the grass pollens.    Adverse food reaction   - throat itch/clearing after shrimp ingestion  - skin testing to shellfish panel is negative  - will obtain shellfish panel  Follow-up 4-6 months or sooner if needed  I appreciate the opportunity to take part in [place] care. Please do not hesitate to contact me with questions.  Sincerely,   Prudy Feeler, MD Allergy/Immunology [facility]

## 2019-08-10 NOTE — Patient Instructions (Addendum)
Hives  - at this time etiology of hives and swelling is unknown.  Hives can be caused by a variety of different triggers including illness/infection, foods, medications, stings, exercise, pressure, vibrations, extremes of temperature to name a few however majority of the time there is no identifiable trigger.  Will obtain labwork to evaluate: CBC w diff, BMP, tryptase, hive panel.  - for hive control recommend use of high-dose antihistamine regimen:   Cetirizine (Zyrtec) 10mg  twice a day and famotidine 20 mg (Pepcid) twice a day. If no symptoms for 7-14 days then decrease to.  Cetirizine (Zyrtec) 10mg  twice a day and famotidine 20 mg (Pepcid) once a day.  If no symptoms for 7-14 days then decrease to.  Cetirizine (Zyrtec) 10mg  twice a day.  If no symptoms for 7-14 days then decrease to.  Cetirizine (Zyrtec) 10mg  once a day.        If symptoms return, then step up dosage  Allergic rhinitis with conjunctivitis  - continue avoidance measures for grass pollens, tree pollens, weed pollens, molds, dust mites, cat, dog, mixed feathers (ie. Down), cockroach, mouse  - for general allergy symptom control continue Xyzal 5mg  or Zyrtec 10mg  daily as needed.    - for itchy, watery, red eyes use Pazeo eye drop 1 drop each daily as needed  - for nasal congestion continue use of Flonase 2 sprays each nostril daily for 1-2 weeks at a time before stopping once improved  - for nasal drainage/post-nasal drip and throat clearing continue use of nasal antihistamine, Astelin 2 sprays each nostril twice a day as needed  - continue Singulair 10mg  daily - take at bedtime  - allergen immunotherapy discussed today including protocol, benefits and risk.  Informational handout provided.  If interested in this therapuetic option you can check with your insurance carrier for coverage.  Let us know if you would like to proceed with this option.    - recommend wearing a mask with seal (like a N95 mask) while performing yard-work  or cutting the grass to decrease inhalation of the grass pollens.    Adverse food reaction   - throat itch/clearing after shrimp ingestion  - skin testing to shellfish panel is negative  - will obtain shellfish panel  Follow-up 4-6 months or sooner if needed

## 2019-08-15 ENCOUNTER — Other Ambulatory Visit: Payer: Managed Care, Other (non HMO)

## 2019-08-15 ENCOUNTER — Other Ambulatory Visit: Payer: Self-pay

## 2019-08-15 DIAGNOSIS — B181 Chronic viral hepatitis B without delta-agent: Secondary | ICD-10-CM

## 2019-08-15 NOTE — Progress Notes (Signed)
Office: [phone]  /  Fax: [phone] TeleHealth Visit:  Ann Woods has verbally consented to this TeleHealth visit today. The patient is located at home, the provider is located at the [facility] office. The participants in this visit include the listed provider and patient and any and all parties involved. The visit was conducted today via telephone. Ann Woods was unable to use realtime audiovisual technology today and the telehealth visit was conducted via telephone (time spent on call 5 minutes, 8:50-8:55).  HPI:  Chief Complaint: OBESITY Ann Woods is here to discuss her progress with her obesity treatment plan. She is on the keep a food journal with 1800 to 1900 calories and 100 grams of protein daily plan and states she is following her eating plan approximately 100 % of the time. She states she is walking 20 minutes 2 times per week.  Ann Woods feels she has lost 3 to 4 pounds (weight 396 lbs 08/09/19). She is journaling consistently. She is meeting her protein goals consistently with vegan options such as veggie burgers and tofu. She denies polyphagia. She has occasional sweets but she is trying to avoid this.  Vitamin D deficiency Ann Woods has a diagnosis of vitamin D deficiency. Her vitamin D level is low at 26.9 on 06/23/19. Ann Woods is on prescription vitamin D twice weekly.    Pre-Diabetes Ann Woods has a diagnosis of prediabetes and she is not on metformin. She denies polyphagia or hypoglycemia.     Lab Results  Component Value Date   HGBA1C 6.0 (H) 06/23/2019    ASSESSMENT AND PLAN:  Vitamin D deficiency - Plan: Vitamin D, Ergocalciferol, (DRISDOL) 1.25 MG (50000 UT) CAPS capsule  Prediabetes  Class 3 severe obesity with serious comorbidity and body mass index (BMI) of 60.0 to 69.9 in adult, unspecified obesity type ([place])  PLAN:  Vitamin D Deficiency Low vitamin D level contributes to fatigue and are associated with obesity, breast, and colon  cancer. Ann Woods agrees to take prescription Vit D @50 ,000 IU every 3 days #10 with no refills and she will follow up for routine testing of vitamin D, at least 2-3 times per year to avoid over-replacement. Ann Woods agrees to follow up with our clinic in 3 weeks.  Pre-Diabetes Ann Woods will continue to work on weight loss, exercise, and decreasing simple carbohydrates to help decrease the risk of diabetes.   Obesity Ann Woods is currently in the action stage of change. As such, her goal is to continue with weight loss efforts She has agreed to keep a food journal with 1800 to 1900 calories and 90 to 100 grams of protein daily  Ann Woods will continue walking for 20 minutes, 2 times per week for weight loss and overall health benefits. We discussed the following Behavioral Modification Strategies today: planning for success and keep a strict food journal  Ann Woods has agreed to follow up with our clinic in 3 weeks. She was informed of the importance of frequent follow up visits to maximize her success with intensive lifestyle modifications for her multiple health conditions.  ALLERGIES: No Known Allergies  MEDICATIONS: Current Outpatient Medications on File Prior to Visit  Medication Sig Dispense Refill  . albuterol (VENTOLIN HFA) 108 (90 Base) MCG/ACT inhaler Inhale 2 puffs into the lungs every 6 (six) hours as needed for wheezing or shortness of breath.    Ann Woods amLODipine (NORVASC) 5 MG tablet Take 1 tablet (5 mg total) by mouth daily. 30 tablet 0  . azelastine (ASTELIN) 0.1 % nasal spray Use 1  spray in each nostril twice daily 30 mL 5  . azelastine (OPTIVAR) 0.05 % ophthalmic solution Use 1 drop in each eye twice daily as needed 6 mL 5  . cetirizine (ZYRTEC ALLERGY) 10 MG tablet Take 1 tablet (10 mg total) by mouth 2 (two) times daily. 60 tablet 5  . cetirizine (ZYRTEC) 10 MG tablet Take 10 mg by mouth daily.    . clotrimazole (LOTRIMIN) 1 % cream Apply 1 application topically 2 (two) times daily.  30 g 0  . famotidine (PEPCID) 20 MG tablet Take 1 tablet (20 mg total) by mouth 2 (two) times daily. 60 tablet 5  . ferrous sulfate 325 (65 FE) MG tablet Take 1 tablet (325 mg total) by mouth daily with breakfast. 30 tablet 0  . levocetirizine (XYZAL) 5 MG tablet Take 5 mg by mouth every evening.    . montelukast (SINGULAIR) 10 MG tablet Take 1 tablet (10 mg total) by mouth at bedtime. 30 tablet 5  . Olopatadine HCl (PAZEO) 0.7 % SOLN Place 1 drop into both eyes daily. 2.5 mL 5  . terbinafine (LAMISIL) 250 MG tablet Take 1 tablet (250 mg total) by mouth daily. 90 tablet 0  . [name] (CYANOCOBALAMIN) 500 MCG tablet Take 500 mcg by mouth daily.    Ann Woods zinc gluconate 50 MG tablet Take 50 mg by mouth daily.     [name].    PAST MEDICAL HISTORY: Past Medical History:  Diagnosis Date  . Anemia   . Hepatitis B   . High cholesterol   . Hypertension    G2 - preeclampsia, CHTN  . Infertility, female   . Lactose intolerance   . Obesity   . Obesity   . Prediabetes 04/01/2018  . Seasonal allergies   . Shortness of breath   . Swelling of both lower extremities   . Urticaria     PAST SURGICAL HISTORY: Past Surgical History:  Procedure Laterality Date  . CESAREAN SECTION    . HIP SURGERY    . TUBAL LIGATION Bilateral 05/05/2014   Procedure: POST PARTUM TUBAL LIGATION;  Surgeon: Donnamae Jude, MD;  Location: [facility];  Service: Gynecology;  Laterality: Bilateral;    SOCIAL HISTORY: Social History   Tobacco Use  . Smoking status: Former Smoker    Years: 0.50    Types: Cigarettes    Quit date: 08/25/1993    Years since quitting: 25.9  . Smokeless tobacco: Never Used  . Tobacco comment: one pack of cigarettes weekly- 05/27/18  Substance Use Topics  . Alcohol use: Not Currently  . Drug use: No    FAMILY HISTORY: Family History  Problem Relation Age of Onset  . Heart disease Mother   . Thyroid disease Mother   . High blood  pressure Mother   . Obesity Mother   . COPD Father   . Allergic rhinitis Neg Hx   . Angioedema Neg Hx   . Asthma Neg Hx   . Eczema Neg Hx   . Immunodeficiency Neg Hx   . Urticaria Neg Hx     ROS: Review of Systems  Constitutional: Positive for weight loss.  Endo/Heme/Allergies:       Negative for polyphagia Negative for hypoglycemia    PHYSICAL EXAM: There were no vitals taken for this visit. There is no height or weight on file to calculate BMI. Physical Exam Vitals reviewed.  Constitutional:      General: She is not in  acute distress.    Appearance: Normal appearance. She is well-developed. She is obese.  Cardiovascular:     Rate and Rhythm: Normal rate.  Pulmonary:     Effort: Pulmonary effort is normal.  Musculoskeletal:        General: Normal range of motion.  Skin:    General: Skin is warm and dry.  Neurological:     Mental Status: She is alert and oriented to person, place, and time.  Psychiatric:        Mood and Affect: Mood normal.        Behavior: Behavior normal.     RECENT LABS AND TESTS: BMET    Component Value Date/Time   NA 141 08/10/2019 1204   K 4.6 08/10/2019 1204   CL 103 08/10/2019 1204   CO2 27 08/10/2019 1204   GLUCOSE 81 08/10/2019 1204   GLUCOSE 92 01/13/2019 1012   BUN 6 08/10/2019 1204   CREATININE 0.69 08/10/2019 1204   CREATININE 0.84 01/13/2019 1012   CALCIUM 9.0 08/10/2019 1204   GFRNONAA 107 08/10/2019 1204   GFRNONAA 86 01/13/2019 1012   GFRAA 123 08/10/2019 1204   GFRAA 99 01/13/2019 1012   Lab Results  Component Value Date   HGBA1C 6.0 (H) 06/23/2019   HGBA1C 5.8 (H) 02/03/2019   HGBA1C 5.7 (H) 08/31/2018   HGBA1C 5.7 (H) 03/02/2018   Lab Results  Component Value Date   INSULIN 16.0 06/23/2019   INSULIN 12.7 02/03/2019   INSULIN 10.4 08/31/2018   CBC    Component Value Date/Time   WBC 3.4 08/10/2019 1204   WBC 3.6 (L) 05/27/2018 1144   RBC 4.68 08/10/2019 1204   RBC 5.14 (H) 05/27/2018 1144   HGB 11.9  08/10/2019 1204   HGB 10.4 11/21/2013 0000   HCT 36.9 08/10/2019 1204   HCT 33 11/21/2013 0000   PLT 239 08/10/2019 1204   PLT 306 11/21/2013 0000   MCV 79 08/10/2019 1204   MCH 25.4 (L) 08/10/2019 1204   MCH 18.8 (L) 02/11/2018 0314   MCHC 32.2 08/10/2019 1204   MCHC 31.0 05/27/2018 1144   RDW 17.2 (H) 08/10/2019 1204   LYMPHSABS 1.5 08/10/2019 1204   MONOABS 0.5 05/27/2018 1144   EOSABS 0.1 08/10/2019 1204   BASOSABS 0.0 08/10/2019 1204   Iron/TIBC/Ferritin/ %Sat    Component Value Date/Time   IRON 34 02/03/2019 1146   TIBC 355 02/03/2019 1146   FERRITIN 23 02/03/2019 1146   IRONPCTSAT 10 (L) 02/03/2019 1146   Lipid Panel     Component Value Date/Time   CHOL 158 06/23/2019 1046   TRIG 57 06/23/2019 1046   HDL 41 06/23/2019 1046   CHOLHDL 3.3 03/02/2018 0954   CHOLHDL 3.8 Ratio 05/07/2009 2031   VLDL 14 05/07/2009 2031   LDLCALC 105 (H) 06/23/2019 1046   Hepatic Function Panel     Component Value Date/Time   PROT 6.9 08/01/2019 1636   PROT 6.8 06/23/2019 1046   ALBUMIN 3.9 06/23/2019 1046   AST 16 08/01/2019 1636   ALT 13 08/01/2019 1636   ALT 15 06/17/2018 0939   ALKPHOS 96 06/23/2019 1046   BILITOT 0.9 08/01/2019 1636   BILITOT 0.4 06/23/2019 1046   BILIDIR 0.4 (H) 08/01/2019 1636   IBILI 0.5 08/01/2019 1636      Component Value Date/Time   TSH 1.520 06/23/2019 1046   TSH 3.140 03/02/2018 0954   TSH 1.950 02/17/2018 1631     Ref. Range 06/23/2019 10:46  Vitamin D, 25-Hydroxy Latest  Ref Range: 30.0 - 100.0 ng/mL 26.9 (L)    I, Doreene Nest, am acting as Location manager for [employer], FNP-C  I have reviewed the above documentation for accuracy and completeness, and I agree with the above.  - Lilyrose Tanney, FNP-C.

## 2019-08-17 LAB — BASIC METABOLIC PANEL
BUN/Creatinine Ratio: 9 (ref 9–23)
BUN: 6 mg/dL (ref 6–24)
CO2: 27 mmol/L (ref 20–29)
Calcium: 9 mg/dL (ref 8.7–10.2)
Chloride: 103 mmol/L (ref 96–106)
Creatinine, Ser: 0.69 mg/dL (ref 0.57–1.00)
GFR calc Af Amer: 123 mL/min/{1.73_m2} (ref >59)
GFR calc non Af Amer: 107 mL/min/{1.73_m2} (ref >59)
Glucose: 81 mg/dL (ref 65–99)
Potassium: 4.6 mmol/L (ref 3.5–5.2)
Sodium: 141 mmol/L (ref 134–144)

## 2019-08-17 LAB — TRYPTASE: Tryptase: 6.4 ug/L (ref 2.2–13.2)

## 2019-08-17 LAB — ALLERGEN PROFILE, SHELLFISH
Clam IgE: <0.1 kU/L
F023-IgE Crab: <0.1 kU/L
F080-IgE Lobster: <0.1 kU/L
F290-IgE Oyster: <0.1 kU/L
Scallop IgE: <0.1 kU/L
Shrimp IgE: 0.68 kU/L — AB

## 2019-08-17 LAB — CBC WITH DIFFERENTIAL/PLATELET
Basophils Absolute: 0 10*3/uL (ref 0.0–0.2)
Basos: 1 %
EOS (ABSOLUTE): 0.1 10*3/uL (ref 0.0–0.4)
Eos: 4 %
Hematocrit: 36.9 % (ref 34.0–46.6)
Hemoglobin: 11.9 g/dL (ref 11.1–15.9)
Immature Grans (Abs): 0 10*3/uL (ref 0.0–0.1)
Immature Granulocytes: 0 %
Lymphocytes Absolute: 1.5 10*3/uL (ref 0.7–3.1)
Lymphs: 44 %
MCH: 25.4 pg — ABNORMAL LOW (ref 26.6–33.0)
MCHC: 32.2 g/dL (ref 31.5–35.7)
MCV: 79 fL (ref 79–97)
Monocytes Absolute: 0.5 10*3/uL (ref 0.1–0.9)
Monocytes: 14 %
Neutrophils Absolute: 1.3 10*3/uL — ABNORMAL LOW (ref 1.4–7.0)
Neutrophils: 37 %
Platelets: 239 10*3/uL (ref 150–450)
RBC: 4.68 x10E6/uL (ref 3.77–5.28)
RDW: 17.2 % — ABNORMAL HIGH (ref 11.7–15.4)
WBC: 3.4 10*3/uL (ref 3.4–10.8)

## 2019-08-17 LAB — CHRONIC URTICARIA: cu index: 2.3 (ref <10)

## 2019-08-17 LAB — THYROID ANTIBODIES
Thyroglobulin Antibody: <1 IU/mL (ref 0.0–0.9)
Thyroperoxidase Ab SerPl-aCnc: <9 IU/mL (ref 0–34)

## 2019-08-19 LAB — COMPLETE METABOLIC PANEL WITH GFR
AG Ratio: 1.2 (calc) (ref 1.0–2.5)
ALT: 12 U/L (ref 6–29)
AST: 18 U/L (ref 10–30)
Albumin: 3.8 g/dL (ref 3.6–5.1)
Alkaline phosphatase (APISO): 69 U/L (ref 31–125)
BUN/Creatinine Ratio: 8 (calc) (ref 6–22)
BUN: 6 mg/dL — ABNORMAL LOW (ref 7–25)
CO2: 27 mmol/L (ref 20–32)
Calcium: 9.3 mg/dL (ref 8.6–10.2)
Chloride: 105 mmol/L (ref 98–110)
Creat: 0.75 mg/dL (ref 0.50–1.10)
GFR, Est African American: 113 mL/min/{1.73_m2} (ref >=60)
GFR, Est Non African American: 98 mL/min/{1.73_m2} (ref >=60)
Globulin: 3.3 g/dL (calc) (ref 1.9–3.7)
Glucose, Bld: 91 mg/dL (ref 65–99)
Potassium: 4.1 mmol/L (ref 3.5–5.3)
Sodium: 141 mmol/L (ref 135–146)
Total Bilirubin: 0.8 mg/dL (ref 0.2–1.2)
Total Protein: 7.1 g/dL (ref 6.1–8.1)

## 2019-08-19 LAB — HEPATITIS B DNA, ULTRAQUANTITATIVE, PCR
Hepatitis B DNA (Calc): 3.46 Log IU/mL — ABNORMAL HIGH
Hepatitis B DNA: 2910 IU/mL — ABNORMAL HIGH

## 2019-08-19 LAB — HEPATITIS B E ANTIGEN: Hep B E Ag: NONREACTIVE

## 2019-08-20 ENCOUNTER — Encounter (INDEPENDENT_AMBULATORY_CARE_PROVIDER_SITE_OTHER): Payer: Self-pay | Admitting: Family Medicine

## 2019-08-23 NOTE — Progress Notes (Signed)
Patient called back. I let her know of Dr.Padgett's instructions and recommendations. Patient voiced understanding.

## 2019-08-24 ENCOUNTER — Ambulatory Visit
Admission: RE | Admit: 2019-08-24 | Discharge: 2019-08-24 | Disposition: A | Discharge status: Home / Self Care | Payer: Managed Care, Other (non HMO) | Source: Ambulatory Visit | Attending: Internal Medicine | Admitting: Internal Medicine

## 2019-08-24 DIAGNOSIS — B181 Chronic viral hepatitis B without delta-agent: Secondary | ICD-10-CM

## 2019-08-29 ENCOUNTER — Other Ambulatory Visit: Payer: Self-pay

## 2019-08-29 ENCOUNTER — Encounter: Payer: Self-pay | Admitting: Internal Medicine

## 2019-08-29 ENCOUNTER — Ambulatory Visit (INDEPENDENT_AMBULATORY_CARE_PROVIDER_SITE_OTHER): Payer: Managed Care, Other (non HMO) | Admitting: Internal Medicine

## 2019-08-29 VITALS — BP 148/91 | HR 82 | Wt >= 6400 oz

## 2019-08-29 DIAGNOSIS — Z23 Encounter for immunization: Secondary | ICD-10-CM | POA: Diagnosis not present

## 2019-08-29 DIAGNOSIS — B181 Chronic viral hepatitis B without delta-agent: Secondary | ICD-10-CM | POA: Diagnosis not present

## 2019-08-29 DIAGNOSIS — K760 Fatty (change of) liver, not elsewhere classified: Secondary | ICD-10-CM | POA: Diagnosis not present

## 2019-08-29 DIAGNOSIS — K7400 Hepatic fibrosis, unspecified: Secondary | ICD-10-CM

## 2019-08-29 NOTE — Assessment & Plan Note (Deleted)
I discussed the results with her and encouraged exercise, weight loss to improve it.

## 2019-08-29 NOTE — Assessment & Plan Note (Signed)
Discussed flu shot and given today 

## 2019-08-29 NOTE — Progress Notes (Signed)
   Subjective:    Patient ID: Ann Woods, female    DOB: July 08, 1976, 44 y.o.   MRN: [id number]  HPI Here for follow-up of hepatitis B. She has positive viral DNA first noted in 2015 and more recently noted to be 6-8,000.  She has never been on treatment.  She has no transaminitis and AST and ALT levels have remained less than 20. 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  Interestingly, her fibro-sure fibrosis score is F0 with a 0 for inflammatory activity.  Her platelets are normal and albumin has remained normal.    She is here for follow up.  HBV DNA level remains stable at 2,910 and normal transaminases.  Ultrasound with no masses, + steatosis and gallstones with no wall thickening.  No complaints otherwise.    Review of Systems  Constitutional: Negative for fever.  Gastrointestinal: Negative for abdominal pain, diarrhea and nausea.  Skin: Negative for rash.       Objective:   Physical Exam Constitutional:      General: She is not in acute distress.    Appearance: She is well-developed.  Eyes:     General: No scleral icterus. Pulmonary:     Effort: No respiratory distress.  Skin:    Findings: No rash.  Psychiatric:        Mood and Affect: Mood normal.    SH: occasional alcohol       Assessment & Plan:

## 2019-08-29 NOTE — Assessment & Plan Note (Signed)
Labs, Fibrosure not c/w advanced liver disease despite elastography score.  Will continue to monitor.  I will consider an MRI next visit to evaluate the liver better.

## 2019-08-29 NOTE — Assessment & Plan Note (Addendum)
Stable DNA level and no liver inflammation.  No changes.  No indication for treatment.  The elastography results are more c/w steatosis rather than issues with her hepatitis B.

## 2019-08-29 NOTE — Assessment & Plan Note (Signed)
I discussed the results with her and encouraged exercise, weight loss to improve it.

## 2019-09-01 ENCOUNTER — Encounter (INDEPENDENT_AMBULATORY_CARE_PROVIDER_SITE_OTHER): Payer: Self-pay | Admitting: Family Medicine

## 2019-09-01 ENCOUNTER — Other Ambulatory Visit: Payer: Self-pay

## 2019-09-01 ENCOUNTER — Ambulatory Visit: Payer: Managed Care, Other (non HMO) | Admitting: Family Medicine

## 2019-09-01 ENCOUNTER — Encounter: Payer: Self-pay | Admitting: Family Medicine

## 2019-09-01 ENCOUNTER — Ambulatory Visit
Admission: RE | Admit: 2019-09-01 | Discharge: 2019-09-01 | Disposition: A | Discharge status: Home / Self Care | Payer: Managed Care, Other (non HMO) | Source: Ambulatory Visit | Attending: Family Medicine | Admitting: Family Medicine

## 2019-09-01 ENCOUNTER — Ambulatory Visit (INDEPENDENT_AMBULATORY_CARE_PROVIDER_SITE_OTHER): Payer: Managed Care, Other (non HMO) | Admitting: Family Medicine

## 2019-09-01 VITALS — BP 144/89 | HR 80 | Temp 97.7°F | Ht 65.0 in | Wt 390.0 lb

## 2019-09-01 VITALS — BP 140/96 | HR 87 | Temp 97.7°F | Wt 385.0 lb

## 2019-09-01 DIAGNOSIS — Z6841 Body Mass Index (BMI) 40.0 and over, adult: Secondary | ICD-10-CM

## 2019-09-01 DIAGNOSIS — I10 Essential (primary) hypertension: Secondary | ICD-10-CM

## 2019-09-01 DIAGNOSIS — M5412 Radiculopathy, cervical region: Secondary | ICD-10-CM

## 2019-09-01 DIAGNOSIS — Z9189 Other specified personal risk factors, not elsewhere classified: Secondary | ICD-10-CM | POA: Diagnosis not present

## 2019-09-01 DIAGNOSIS — R7303 Prediabetes: Secondary | ICD-10-CM

## 2019-09-01 MED ORDER — AMLODIPINE BESY-BENAZEPRIL HCL 5-10 MG PO CAPS: 1.0000 | ORAL_CAPSULE | Freq: Every day | ORAL | 0 refills | Status: AC

## 2019-09-01 MED ORDER — METHOCARBAMOL 500 MG PO TABS: 500.0000 mg | ORAL_TABLET | Freq: Four times a day (QID) | ORAL | 0 refills | Status: DC

## 2019-09-01 MED ORDER — KETOROLAC TROMETHAMINE 60 MG/2ML IM SOLN
60.0000 mg | Freq: Once | INTRAMUSCULAR | Status: AC
  Administered 2019-09-01: 11:00:00 60 mg via INTRAMUSCULAR

## 2019-09-01 NOTE — Progress Notes (Signed)
Subjective:    Patient ID: Ann Woods, female    DOB: 1975-11-04, 44 y.o.   MRN: [id number]  HPI Chief Complaint  Patient presents with  . shoulder and neck pain    x 1 week going down right arm   Complains of right lateral neck pain that started 1 week ago. States the pain is a "pulling sensation" and an "ache" in her right shoulder and upper bicep. Fingers feel slightly numb, particularly her thumb but she still has good sensation in her hands. No weakness.   No known injury but she was lifting christmas decorations around the same time this occured.  Pain has not worsened but is consistent   She used ice and heat until last night.  States she took a muscle relaxant last night and it helped but pain returned this morning.  Tylenol last night as well.   States pain is a 6/10 currently.   No history of neck pain or injury.   Denies fever, chills, dizziness, chest pain, palpitations, shortness of breath, abdominal pain, N/V/D, urinary symptoms, LE edema. No other arthralgias or myalgias.    Her BP is elevated today.  States her BP at home is usually 130s/70-80s States she thinks her BP is elevated today due to pain.   She was started on amlodipine by [facility] and has an appt later today with them.   Reviewed allergies, medications, past medical, surgical, family, and social history.   Review of Systems Pertinent positives and negatives in the history of present illness.     Objective:   Physical Exam Constitutional:      General: She is not in acute distress.    Appearance: She is obese.  Eyes:     Conjunctiva/sclera: Conjunctivae normal.  Cardiovascular:     Rate and Rhythm: Normal rate and regular rhythm.     Pulses: Normal pulses.     Heart sounds: Normal heart sounds.  Pulmonary:     Effort: Pulmonary effort is normal.     Breath sounds: Normal breath sounds.  Musculoskeletal:     Right shoulder: No bony tenderness. Normal strength.  Normal pulse.     Left shoulder: Normal.     Cervical back: Neck supple. Spasms present. Pain with movement and muscular tenderness present. No spinous process tenderness. Decreased range of motion.     Thoracic back: Normal.     Lumbar back: Normal.       Back:     Comments: Right upper trapezius spasm, markedly TTP  Lymphadenopathy:     Cervical: No cervical adenopathy.  Skin:    General: Skin is warm and dry.     Capillary Refill: Capillary refill takes less than 2 seconds.     Findings: No rash.  Neurological:     Mental Status: She is alert.    BP (!) 140/96   Pulse 87   Temp 97.7 F (36.5 C)   Wt (!) 385 lb (174.6 kg)   BMI 65.06 kg/m       Assessment & Plan:  Cervical radiculopathy - Plan: methocarbamol (ROBAXIN) 500 MG tablet, DG Cervical Spine Complete  Essential hypertension  Attempted to do gentle stretch to relieve muscle spasm but this was painful for her. Dr. Redmond School also attempted. She received a Toradol 60 mg injection in the office. She will go to [facility] for a cervical XR. Encouraged pain relief with 2 Aleve, Robaxin, heat or ice and topical analgesic. No neurological deficits currently  but if this changes, she will let us know or seek immediate medical attention. Follow up if worsening and consider MRI.  HTN - she is aware that her BP is significantly elevated. Suspect this is partially due to her pain. She is seeing weight management later this afternoon and will discuss this with them since they started her on amlodipine 5 mg. She may need to have this dose increased.

## 2019-09-01 NOTE — Patient Instructions (Signed)
Go to [place] for [employer].   You were given Toradol 60 mg injection in our office.   You may take the Robaxin (muscle relaxant) as prescribed.  Use heat or ice often.   You may also use a topical pain medication of your choice (biofreeze, [place], etc)  Take 2 Aleve with food but not until tonight and then you may take it twice daily.   Follow up if you are getting worse or not improving by early next week.     Cervical Radiculopathy  Cervical radiculopathy means that a nerve in the neck (a cervical nerve) is pinched or bruised. This can happen because of an injury to the cervical spine (vertebrae) in the neck, or as a normal part of getting older. This can cause pain or loss of feeling (numbness) that runs from your neck all the way down to your arm and fingers. Often, this condition gets better with rest. Treatment may be needed if the condition does not get better. What are the causes?  A neck injury.  A bulging disk in your spine.  Muscle movements that you cannot control (muscle spasms).  Tight muscles in your neck due to overuse.  Arthritis.  Breakdown in the bones and joints of the spine (spondylosis) due to getting older.  Bone spurs that form near the nerves in the neck. What are the signs or symptoms?  Pain. The pain may: ? Run from the neck to the arm and hand. ? Be very bad or irritating. ? Be worse when you move your neck.  Loss of feeling or tingling in your arm or hand.  Weakness in your arm or hand, in very bad cases. How is this treated? In many cases, treatment is not needed for this condition. With rest, the condition often gets better over time. If treatment is needed, options may include:  Wearing a soft neck collar (cervical collar) for short periods of time, as told by your doctor.  Doing exercises (physical therapy) to strengthen your neck muscles.  Taking medicines.  Having shots (injections) in your spine, in very bad  cases.  Having surgery. This may be needed if other treatments do not help. The type of surgery that is used depends on the cause of your condition. Follow these instructions at home: If you have a soft neck collar:  Wear it as told by your doctor. Remove it only as told by your doctor.  Ask your doctor if you can remove the collar for cleaning and bathing. If you are allowed to remove the collar for cleaning or bathing: ? Follow instructions from your doctor about how to remove the collar safely. ? Clean the collar by wiping it with mild soap and water and drying it completely. ? Take out any removable pads in the collar every 1-2 days. Wash them by hand with soap and water. Let them air-dry completely before you put them back in the collar. ? Check your skin under the collar for redness or sores. If you see any, tell your doctor. Managing pain      Take over-the-counter and prescription medicines only as told by your doctor.  If told, put ice on the painful area. ? If you have a soft neck collar, remove it as told by your doctor. ? Put ice in a plastic bag. ? Place a towel between your skin and the bag. ? Leave the ice on for 20 minutes, 2-3 times a day.  If using ice  does not help, you can try using heat. Use the heat source that your doctor recommends, such as a moist heat pack or a heating pad. ? Place a towel between your skin and the heat source. ? Leave the heat on for 20-30 minutes. ? Remove the heat if your skin turns bright red. This is very important if you are unable to feel pain, heat, or cold. You may have a greater risk of getting burned.  You may try a gentle neck and shoulder rub (massage). Activity  Rest as needed.  Return to your normal activities as told by your doctor. Ask your doctor what activities are safe for you.  Do exercises as told by your doctor or physical therapist.  Do not lift anything that is heavier than 10 lb (4.5 kg) until your doctor  tells you that it is safe. General instructions  Use a flat pillow when you sleep.  Do not drive while wearing a soft neck collar. If you do not have a soft neck collar, ask your doctor if it is safe to drive while your neck heals.  Ask your doctor if the medicine prescribed to you requires you to avoid driving or using heavy machinery.  Do not use any products that contain nicotine or tobacco, such as cigarettes, e-cigarettes, and chewing tobacco. These can delay healing. If you need help quitting, ask your doctor.  Keep all follow-up visits as told by your doctor. This is important. Contact a doctor if:  Your condition does not get better with treatment. Get help right away if:  Your pain gets worse and is not helped with medicine.  You lose feeling or feel weak in your hand, arm, face, or leg.  You have a high fever.  You have a stiff neck.  You cannot control when you poop or pee (have incontinence).  You have trouble with walking, balance, or talking. Summary  Cervical radiculopathy means that a nerve in the neck is pinched or bruised.  A nerve can get pinched from a bulging disk, arthritis, an injury to the neck, or other causes.  Symptoms include pain, tingling, or loss of feeling that goes from the neck into the arm or hand.  Weakness in your arm or hand can happen in very bad cases.  Treatment may include resting, wearing a soft neck collar, and doing exercises. You might need to take medicines for pain. In very bad cases, shots or surgery may be needed. This information is not intended to replace advice given to you by your health care provider. Make sure you discuss any questions you have with your health care provider. Document Revised: 07/02/2018 Document Reviewed: 07/02/2018 Elsevier Patient Education  2020 [employer].

## 2019-09-02 ENCOUNTER — Encounter: Payer: Self-pay | Admitting: Family Medicine

## 2019-09-06 NOTE — Progress Notes (Signed)
Chief Complaint:   OBESITY Ann Woods is here to discuss her progress with her obesity treatment plan along with follow-up of her obesity related diagnoses. Ann Woods is keeping a food journal and adhering to recommended goals of 1200 calories and 90-100 grams of protein and states she is following her eating plan approximately 100% of the time. Ann Woods states she is exercising 0 minutes 0 times per week.  Today's visit was #: 21 Starting weight: 403 lbs Starting date: 08/31/2018 Today's weight: 390 lbs Today's date: 09/01/2019 Total lbs lost to date: 13 Total lbs lost since last in-office visit: 9  Interim History: Ann Woods has stuck to the plan very well over the last 3-4 weeks. She has cut back on oil and salt. She has only been eating 1200 calories a day because she thought that was her goal. She is vegan.  Subjective:   Essential hypertension.  Not well controlled. Blood pressure is high today (144/89). She is on Norvasc 5 mg only.  No chest pain or shortness of breath. BP Readings from Last 3 Encounters:  09/01/19 (!) 144/89  09/01/19 (!) 140/96  08/29/19 (!) 148/91    Prediabetes. Denies polyphagia. She is not on metformin.   At risk for heart disease. Rickiyah is at a higher than average risk for cardiovascular disease due to obesity. Reviewed: no chest pain on exertion, no dyspnea on exertion, and no swelling of ankles.  Assessment/Plan:   Essential hypertension. Chesna is working on healthy weight loss and exercise to improve blood pressure control. New prescription for amLODipine-benazepril (LOTREL) 5-10 MG capsule #30 with 0 refills was given. She will discontinue Norvasc.  Prediabetes. Ann Woods will continue to work on weight loss, exercise, and decreasing simple carbohydrates to help decrease the risk of diabetes. She will continue her meal plan.  At risk for heart disease. Ann Woods was given approximately 15 minutes of coronary artery disease prevention  counseling today. She is a 44 y.o. female and has risk factors for heart disease including obesity. We discussed intensive lifestyle modifications today with an emphasis on specific weight loss instructions and strategies.   Class 3 severe obesity with serious comorbidity and body mass index (BMI) of 60.0 to 69.9 in adult, unspecified obesity type ([place]).  Ann Woods is currently in the action stage of change. As such, her goal is to continue with weight loss efforts. She has agreed to keeping a food journal and adhering to recommended goals of 1800-1900 calories and 110 grams of protein.   We discussed the following exercise goals today: For substantial health benefits, adults should do at least 150 minutes (2 hours and 30 minutes) a week of moderate-intensity, or 75 minutes (1 hour and 15 minutes) a week of vigorous-intensity aerobic physical activity, or an equivalent combination of moderate- and vigorous-intensity aerobic activity. Aerobic activity should be performed in episodes of at least 10 minutes, and preferably, it should be spread throughout the week. Adults should also include muscle-strengthening activities that involve all major muscle groups on 2 or more days a week.  We discussed the following behavioral modification strategies today: planning for success.  FATMATA Woods has agreed to follow-up with our clinic in 3 weeks. She was informed of the importance of frequent follow-up visits to maximize her success with intensive lifestyle modifications for her multiple health conditions.   Objective:   Blood pressure (!) 144/89, pulse 80, temperature 97.7 F (36.5 C), temperature source Oral, height 5\' 5"  (1.651 m), weight (!) 390 lb (  176.9 kg), SpO2 98 %. Body mass index is 64.9 kg/m.  General: Cooperative, alert, well developed, in no acute distress. HEENT: Conjunctivae and lids unremarkable. Neck: No thyromegaly.  Cardiovascular: Regular rhythm.  Lungs: Normal work of  breathing. Extremities: No edema.  Neurologic: No focal deficits.   Lab Results  Component Value Date   CREATININE 0.75 08/15/2019   BUN 6 (L) 08/15/2019   NA 141 08/15/2019   K 4.1 08/15/2019   CL 105 08/15/2019   CO2 27 08/15/2019   Lab Results  Component Value Date   ALT 12 08/15/2019   AST 18 08/15/2019   ALKPHOS 96 06/23/2019   BILITOT 0.8 08/15/2019   Lab Results  Component Value Date   HGBA1C 6.0 (H) 06/23/2019   HGBA1C 5.8 (H) 02/03/2019   HGBA1C 5.7 (H) 08/31/2018   HGBA1C 5.7 (H) 03/02/2018   Lab Results  Component Value Date   INSULIN 16.0 06/23/2019   INSULIN 12.7 02/03/2019   INSULIN 10.4 08/31/2018   Lab Results  Component Value Date   TSH 1.520 06/23/2019   Lab Results  Component Value Date   CHOL 158 06/23/2019   HDL 41 06/23/2019   LDLCALC 105 (H) 06/23/2019   TRIG 57 06/23/2019   CHOLHDL 3.3 03/02/2018   Lab Results  Component Value Date   WBC 3.4 08/10/2019   HGB 11.9 08/10/2019   HCT 36.9 08/10/2019   MCV 79 08/10/2019   PLT 239 08/10/2019   Lab Results  Component Value Date   IRON 34 02/03/2019   TIBC 355 02/03/2019   FERRITIN 23 02/03/2019   Attestation Statements:   Reviewed by clinician on day of visit: allergies, medications, problem list, medical history, surgical history, family history, social history, and previous encounter notes.  IMichaelene Song, am acting as Location manager for [employer], FNP  I have reviewed the above documentation for accuracy and completeness, and I agree with the above. -  Georgianne Fick, FNP

## 2019-09-19 ENCOUNTER — Encounter (INDEPENDENT_AMBULATORY_CARE_PROVIDER_SITE_OTHER): Payer: Self-pay | Admitting: Family Medicine

## 2019-09-19 ENCOUNTER — Ambulatory Visit (INDEPENDENT_AMBULATORY_CARE_PROVIDER_SITE_OTHER): Payer: Managed Care, Other (non HMO) | Admitting: Family Medicine

## 2019-09-19 ENCOUNTER — Other Ambulatory Visit: Payer: Self-pay

## 2019-09-19 VITALS — BP 133/86 | HR 88 | Temp 98.4°F | Ht 65.0 in | Wt 388.0 lb

## 2019-09-19 DIAGNOSIS — R7303 Prediabetes: Secondary | ICD-10-CM | POA: Diagnosis not present

## 2019-09-19 DIAGNOSIS — Z6841 Body Mass Index (BMI) 40.0 and over, adult: Secondary | ICD-10-CM | POA: Diagnosis not present

## 2019-09-20 NOTE — Progress Notes (Signed)
Chief Complaint:   OBESITY Ann Woods is here to discuss her progress with her obesity treatment plan along with follow-up of her obesity related diagnoses. Ann Woods is on keeping a food journal of 1800 calories and 110 grams of protein daily plan and states she is following her eating plan approximately 100% of the time. Ann Woods states she is walking 20 minutes 2 times per week.  Today's visit was #: 22 Starting weight: 403 lbs Starting date: 08/31/2018 Today's weight: 388 lbs Today's date: 09/19/2019 Total lbs lost to date: 15 Total lbs lost since last in-office visit: 2  Interim History: Ann Woods is working on increasing protein and averages around 65-70 grams per day. She is now eating more calories. She had been eating only about 1200-1300 calories per day when she was prescribed 1800-1900.. It is a challenge for her to increase protein since she is vegan. She has also been trying to decrease carbohydrates.  Subjective:   Prediabetes Ann Woods has a diagnosis of prediabetes based on her elevated Hgb A1c. She is not on metformin. She continues to work on diet and exercise to decrease her risk of diabetes. She denies polyphagia. We discussed metformin and she declines.  Lab Results  Component Value Date   HGBA1C 6.0 (H) 06/23/2019   Lab Results  Component Value Date   INSULIN 16.0 06/23/2019   INSULIN 12.7 02/03/2019   INSULIN 10.4 08/31/2018    Assessment/Plan:   Prediabetes Ann Woods will continue with the meal plan and she will continue to work on weight loss, exercise, and decreasing simple carbohydrates to help decrease the risk of diabetes.   Class 3 severe obesity with serious comorbidity and body mass index (BMI) of 60.0 to 69.9 in adult, unspecified obesity type Ann Woods) Ann Woods is currently in the action stage of change. As such, her goal is to continue with weight loss efforts. She has agreed to keeping a food journal and adhering to recommended goals of 1800 to  1900 calories and 110 grams of protein daily.   Exercise goals: Ann Woods will increase walking to 3 times per week.  Behavioral modification strategies: increasing lean protein intake, decreasing simple carbohydrates and planning for success. We discussed foods that she could add to her diet to reduce carbohydrates and increase protein.   Ann Woods has agreed to follow-up with our clinic in 3 weeks. She was informed of the importance of frequent follow-up visits to maximize her success with intensive lifestyle modifications for her multiple health conditions.   Objective:   Blood pressure 133/86, pulse 88, temperature 98.4 F (36.9 C), temperature source Oral, height 5\' 5"  (1.651 m), weight (!) 388 lb (176 kg), SpO2 98 %. Body mass index is 64.57 kg/m.  General: Cooperative, alert, well developed, in no acute distress. HEENT: Conjunctivae and lids unremarkable. Cardiovascular: Regular rhythm.  Lungs: Normal work of breathing. Neurologic: No focal deficits.   Lab Results  Component Value Date   CREATININE 0.75 08/15/2019   BUN 6 (L) 08/15/2019   NA 141 08/15/2019   K 4.1 08/15/2019   CL 105 08/15/2019   CO2 27 08/15/2019   Lab Results  Component Value Date   ALT 12 08/15/2019   AST 18 08/15/2019   ALKPHOS 96 06/23/2019   BILITOT 0.8 08/15/2019   Lab Results  Component Value Date   HGBA1C 6.0 (H) 06/23/2019   HGBA1C 5.8 (H) 02/03/2019   HGBA1C 5.7 (H) 08/31/2018   HGBA1C 5.7 (H) 03/02/2018   Lab Results  Component Value  Date   INSULIN 16.0 06/23/2019   INSULIN 12.7 02/03/2019   INSULIN 10.4 08/31/2018   Lab Results  Component Value Date   TSH 1.520 06/23/2019   Lab Results  Component Value Date   CHOL 158 06/23/2019   HDL 41 06/23/2019   LDLCALC 105 (H) 06/23/2019   TRIG 57 06/23/2019   CHOLHDL 3.3 03/02/2018   Lab Results  Component Value Date   WBC 3.4 08/10/2019   HGB 11.9 08/10/2019   HCT 36.9 08/10/2019   MCV 79 08/10/2019   PLT 239 08/10/2019    Lab Results  Component Value Date   IRON 34 02/03/2019   TIBC 355 02/03/2019   FERRITIN 23 02/03/2019    Ref. Range 06/23/2019 10:46  Vitamin D, 25-Hydroxy Latest Ref Range: 30.0 - 100.0 ng/mL 26.9 (L)    Attestation Statements:   Reviewed by clinician on day of visit: allergies, medications, problem list, medical history, surgical history, family history, social history, and previous encounter notes.  Corey Skains, am acting as Location manager for [employer], FNP-C.  I have reviewed the above documentation for accuracy and completeness, and I agree with the above. -  Mckinzey Entwistle [employer], FNP-C

## 2019-09-21 ENCOUNTER — Encounter (INDEPENDENT_AMBULATORY_CARE_PROVIDER_SITE_OTHER): Payer: Self-pay | Admitting: Family Medicine

## 2019-10-07 IMAGING — DX DG CHEST 2V
2 series · 2 of 2 positions shown · non-contrast
Comparison: 07/17/2014.

CLINICAL DATA: Cough and shortness of breath for 1 week.

EXAM:
CHEST - 2 VIEW

[chest pa]
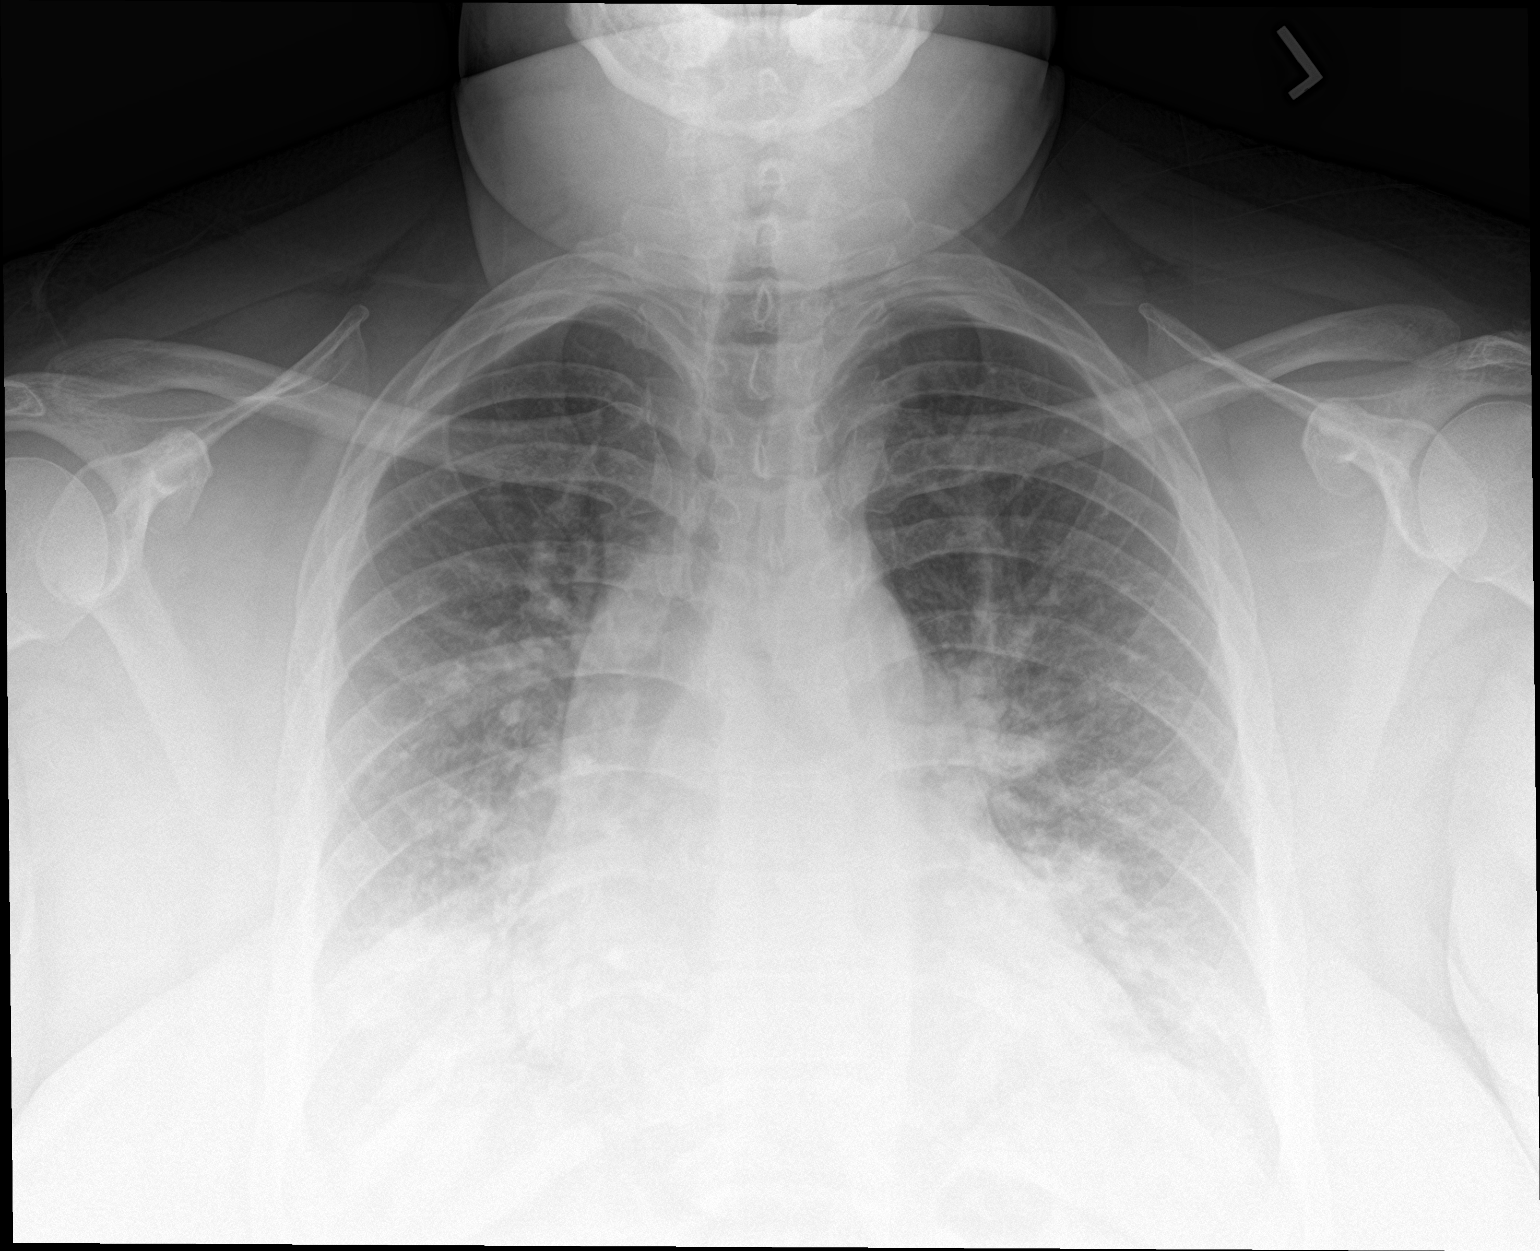

[chest lat]
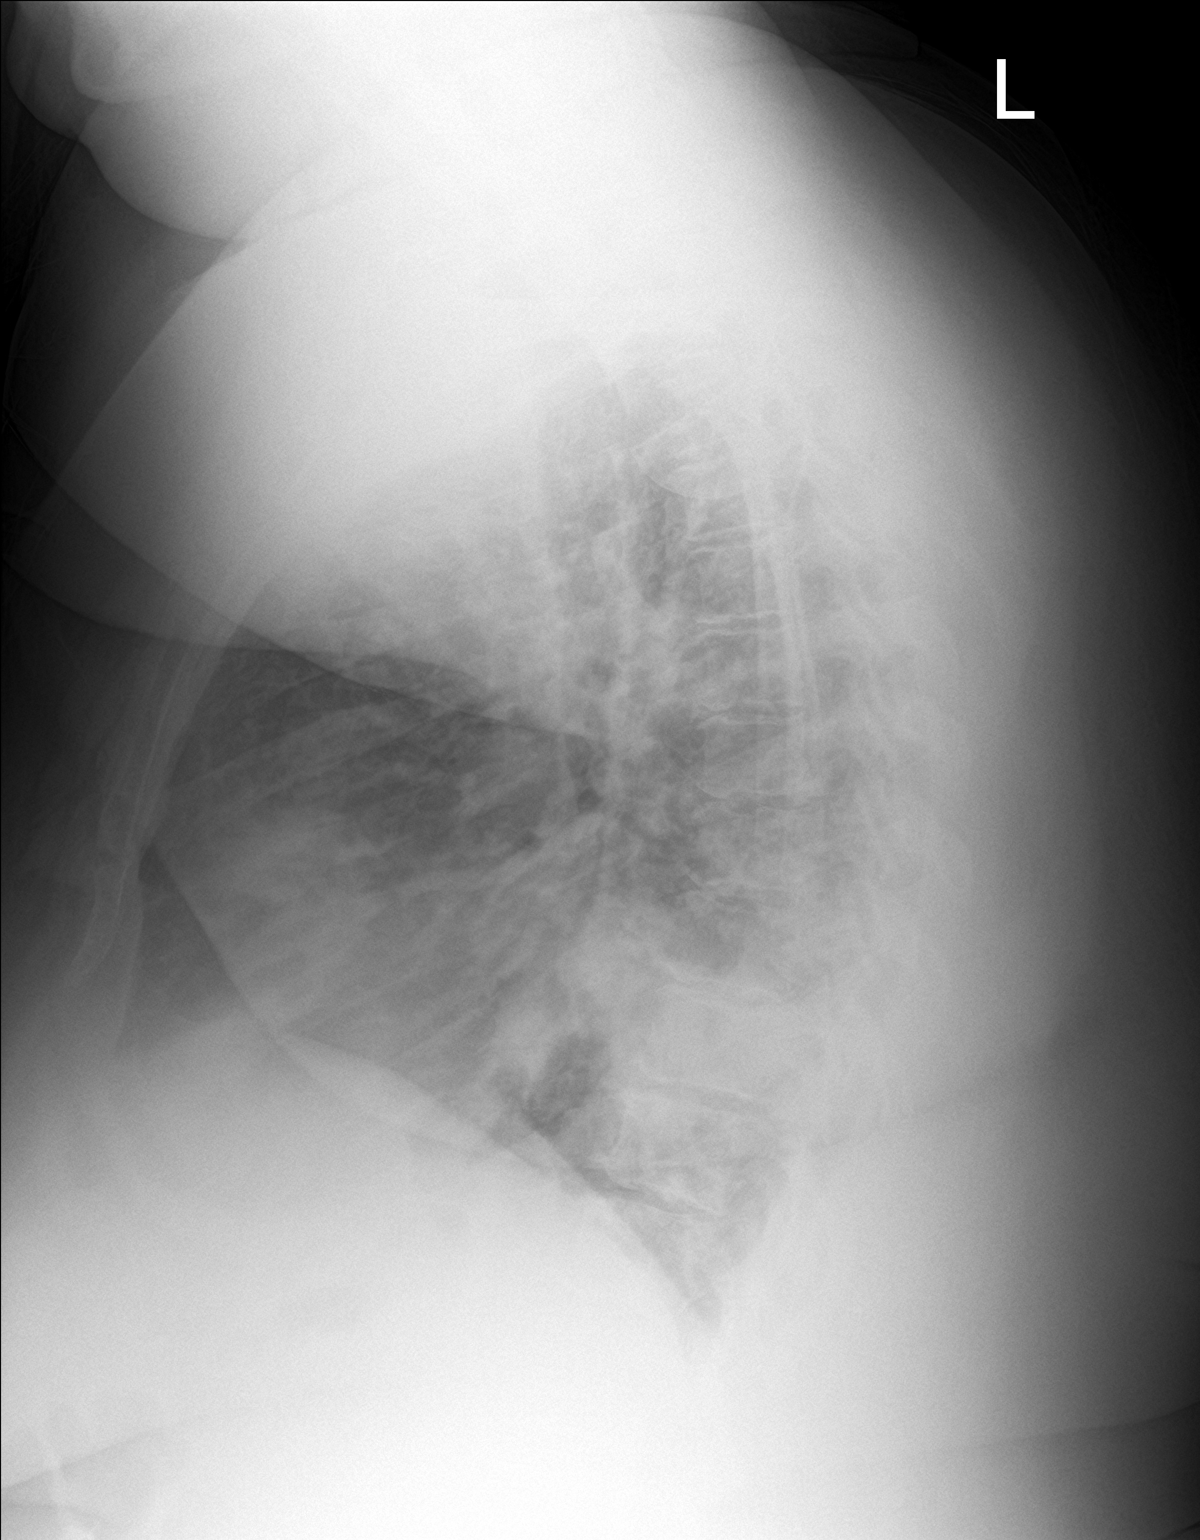

[2 of 2 positions shown; findings below may reference images not displayed]

FINDINGS: The heart is enlarged. There are BILATERAL pulmonary opacities which
could represent pneumonia or pulmonary edema. No acute osseous
findings.
IMPRESSION: Cardiomegaly with BILATERAL pulmonary opacities which could
represent pneumonia or pulmonary edema.Marked worsening aeration
when compared with priors.

## 2019-10-10 ENCOUNTER — Other Ambulatory Visit: Payer: Self-pay

## 2019-10-10 ENCOUNTER — Ambulatory Visit (INDEPENDENT_AMBULATORY_CARE_PROVIDER_SITE_OTHER): Payer: Managed Care, Other (non HMO) | Admitting: Family Medicine

## 2019-10-10 ENCOUNTER — Encounter (INDEPENDENT_AMBULATORY_CARE_PROVIDER_SITE_OTHER): Payer: Self-pay | Admitting: Family Medicine

## 2019-10-10 VITALS — BP 132/83 | HR 81 | Temp 97.9°F | Ht 65.0 in | Wt 386.0 lb

## 2019-10-10 DIAGNOSIS — Z9189 Other specified personal risk factors, not elsewhere classified: Secondary | ICD-10-CM | POA: Diagnosis not present

## 2019-10-10 DIAGNOSIS — R7303 Prediabetes: Secondary | ICD-10-CM

## 2019-10-10 DIAGNOSIS — Z6841 Body Mass Index (BMI) 40.0 and over, adult: Secondary | ICD-10-CM

## 2019-10-10 DIAGNOSIS — E538 Deficiency of other specified B group vitamins: Secondary | ICD-10-CM | POA: Diagnosis not present

## 2019-10-10 DIAGNOSIS — E7849 Other hyperlipidemia: Secondary | ICD-10-CM | POA: Insufficient documentation

## 2019-10-10 DIAGNOSIS — E559 Vitamin D deficiency, unspecified: Secondary | ICD-10-CM

## 2019-10-10 NOTE — Progress Notes (Signed)
Chief Complaint:   OBESITY Ann Woods is here to discuss her progress with her obesity treatment plan along with follow-up of her obesity related diagnoses. Ann Woods is on keeping a food journal and adhering to recommended goals of 1800 calories and 80 grams of protein daily plan and states she is following her eating plan approximately 100% of the time. Ann Woods states she is walking 20 minutes 2 times per week.  Today's visit was #: 23 Starting weight: 403 lbs Starting date: 08/31/2018 Today's weight: 386 lbs Today's date: 10/10/2019 Total lbs lost to date: 17 Total lbs lost since last in-office visit: 2  Interim History: Ann Woods is journaling daily, and she is meeting her protein and calorie goals. She continues to work on reducing carbohydrates in snack foods. She is drinking plenty of water. Ann Woods is still following a vegan diet.  Subjective:   Vitamin D deficiency  Ann Woods's Vitamin D level was low at 26.9 on 06/23/19. She is on prescription vit D every 3 days. She reports her energy level is better.  Prediabetes  Yesinia has a diagnosis of prediabetes and her last A1c was 6.0 (06/23/19). She denies polyphagia. She continues to work on diet and exercise to decrease her risk of diabetes. Ann Woods is not on metformin. She has a strong family history of diabetes with two brothers who have DM.  Lab Results  Component Value Date   HGBA1C 6.0 (H) 06/23/2019   Lab Results  Component Value Date   INSULIN 16.0 06/23/2019   INSULIN 12.7 02/03/2019   INSULIN 10.4 08/31/2018   Other hyperlipidemia  Ann Woods has hyperlipidemia and her last LDL was slightly elevated at 105. HDL was at 41. Her mother has coronary artery disease. Ann Woods is not on statin.   Lab Results  Component Value Date   ALT 12 08/15/2019   AST 18 08/15/2019   ALKPHOS 96 06/23/2019   BILITOT 0.8 08/15/2019   Lab Results  Component Value Date   CHOL 158 06/23/2019   HDL 41 06/23/2019   LDLCALC 105  (H) 06/23/2019   TRIG 57 06/23/2019   CHOLHDL 3.3 03/02/2018   B12 deficiency  Ann Woods is on B12 supplement 1,000 mcg daily. Ann Woods is vegan.  Lab Results  Component Value Date   [id number] 02/03/2019   At risk for heart disease Ann Woods is at a higher than average risk for cardiovascular disease due to obesity, prediabetes, family history of CAD, and hyperlipidemia. .  Assessment/Plan:   Vitamin D deficiency  Low Vitamin D level contributes to fatigue and are associated with obesity, breast, and colon cancer. Dayelin will continue to take prescription Vitamin D @50 ,000 IU every 3 days and we will check vitamin D level. She will follow-up for routine testing of Vitamin D, at least 2-3 times per year to avoid over-replacement.  Prediabetes  Ann Woods will continue to work on weight loss, exercise, and decreasing simple carbohydrates to help decrease the risk of diabetes. We will check A1c and fasting insulin today.  Other hyperlipidemia  We will check fasting lipid panel today. Ann Woods will continue to work on diet, exercise and weight loss efforts. Orders and follow up as documented in patient record.   B12 deficiency  We will check B12/folate level today and we will continue to monitor. Orders and follow up as documented in patient record.  At risk for heart disease Ann Woods was given approximately 15 minutes of coronary artery disease prevention counseling today. She is 44 y.o. female and has  risk factors for heart disease including obesity, prediabetes and hyperlipidemia. We discussed intensive lifestyle modifications today with an emphasis on specific weight loss instructions and strategies.   Repetitive spaced learning was employed today to elicit superior memory formation and behavioral change.  Class 3 severe obesity with serious comorbidity and body mass index (BMI) of 60.0 to 69.9 in adult, unspecified obesity type Ann Woods is currently in the action stage of change.  As such, her goal is to continue with weight loss efforts. She has agreed to keeping a food journal and adhering to recommended goals of 1800 to 1900 calories and 90 grams of protein daily.   Exercise goals: Ann Woods will increase walking to 3 times per week.  Behavioral modification strategies: decreasing simple carbohydrates, planning for success and keeping a strict food journal.  Ann Woods has agreed to follow-up with our clinic in 3 weeks. She was informed of the importance of frequent follow-up visits to maximize her success with intensive lifestyle modifications for her multiple health conditions.   Tamula was informed we would discuss her lab results at her next visit unless there is a critical issue that needs to be addressed sooner. Ann Woods agreed to keep her next visit at the agreed upon time to discuss these results.  Objective:   Blood pressure 132/83, pulse 81, temperature 97.9 F (36.6 C), temperature source Oral, height 5\' 5"  (1.651 m), weight (!) 386 lb (175.1 kg), SpO2 96 %. Body mass index is 64.23 kg/m.  General: Cooperative, alert, well developed, in no acute distress. HEENT: Conjunctivae and lids unremarkable. Cardiovascular: Regular rhythm.  Lungs: Normal work of breathing. Neurologic: No focal deficits.   Lab Results  Component Value Date   CREATININE 0.75 08/15/2019   BUN 6 (L) 08/15/2019   NA 141 08/15/2019   K 4.1 08/15/2019   CL 105 08/15/2019   CO2 27 08/15/2019   Lab Results  Component Value Date   ALT 12 08/15/2019   AST 18 08/15/2019   ALKPHOS 96 06/23/2019   BILITOT 0.8 08/15/2019   Lab Results  Component Value Date   HGBA1C 6.0 (H) 06/23/2019   HGBA1C 5.8 (H) 02/03/2019   HGBA1C 5.7 (H) 08/31/2018   HGBA1C 5.7 (H) 03/02/2018   Lab Results  Component Value Date   INSULIN 16.0 06/23/2019   INSULIN 12.7 02/03/2019   INSULIN 10.4 08/31/2018   Lab Results  Component Value Date   TSH 1.520 06/23/2019   Lab Results  Component Value  Date   CHOL 158 06/23/2019   HDL 41 06/23/2019   LDLCALC 105 (H) 06/23/2019   TRIG 57 06/23/2019   CHOLHDL 3.3 03/02/2018   Lab Results  Component Value Date   WBC 3.4 08/10/2019   HGB 11.9 08/10/2019   HCT 36.9 08/10/2019   MCV 79 08/10/2019   PLT 239 08/10/2019   Lab Results  Component Value Date   IRON 34 02/03/2019   TIBC 355 02/03/2019   FERRITIN 23 02/03/2019    Ref. Range 06/23/2019 10:46  Vitamin D, 25-Hydroxy Latest Ref Range: 30.0 - 100.0 ng/mL 26.9 (L)    Attestation Statements:   Reviewed by clinician on day of visit: allergies, medications, problem list, medical history, surgical history, family history, social history, and previous encounter notes.  Corey Skains, am acting as Location manager for [employer], FNP-C.  I have reviewed the above documentation for accuracy and completeness, and I agree with the above. -  Kerstyn Coryell [employer], FNP-C

## 2019-10-11 LAB — LIPID PANEL WITH LDL/HDL RATIO
Cholesterol, Total: 154 mg/dL (ref 100–199)
HDL: 36 mg/dL — ABNORMAL LOW (ref >39)
LDL Chol Calc (NIH): 96 mg/dL (ref 0–99)
LDL/HDL Ratio: 2.7 ratio (ref 0.0–3.2)
Triglycerides: 122 mg/dL (ref 0–149)
VLDL Cholesterol Cal: 22 mg/dL (ref 5–40)

## 2019-10-11 LAB — VITAMIN B12: Vitamin B-12: 335 pg/mL (ref 232–1245)

## 2019-10-11 LAB — FOLATE: Folate: 7.5 ng/mL (ref >3.0)

## 2019-10-11 LAB — INSULIN, RANDOM: INSULIN: 20.8 u[IU]/mL (ref 2.6–24.9)

## 2019-10-11 LAB — HEMOGLOBIN A1C
Est. average glucose Bld gHb Est-mCnc: 120 mg/dL
Hgb A1c MFr Bld: 5.8 % — ABNORMAL HIGH (ref 4.8–5.6)

## 2019-10-11 LAB — VITAMIN D 25 HYDROXY (VIT D DEFICIENCY, FRACTURES): Vit D, 25-Hydroxy: 30 ng/mL (ref 30.0–100.0)

## 2019-10-31 ENCOUNTER — Other Ambulatory Visit: Payer: Self-pay

## 2019-10-31 ENCOUNTER — Encounter (INDEPENDENT_AMBULATORY_CARE_PROVIDER_SITE_OTHER): Payer: Self-pay | Admitting: Family Medicine

## 2019-10-31 ENCOUNTER — Telehealth (INDEPENDENT_AMBULATORY_CARE_PROVIDER_SITE_OTHER): Payer: Managed Care, Other (non HMO) | Admitting: Family Medicine

## 2019-10-31 DIAGNOSIS — E559 Vitamin D deficiency, unspecified: Secondary | ICD-10-CM | POA: Diagnosis not present

## 2019-10-31 DIAGNOSIS — G478 Other sleep disorders: Secondary | ICD-10-CM

## 2019-10-31 DIAGNOSIS — E786 Lipoprotein deficiency: Secondary | ICD-10-CM | POA: Diagnosis not present

## 2019-10-31 DIAGNOSIS — Z6841 Body Mass Index (BMI) 40.0 and over, adult: Secondary | ICD-10-CM

## 2019-10-31 NOTE — Progress Notes (Signed)
TeleHealth Visit:  Due to the COVID-19 pandemic, this visit was completed with telemedicine (audio/video) technology to reduce patient and provider exposure as well as to preserve personal protective equipment.   Ann Woods has verbally consented to this TeleHealth visit. The patient is located at home, the provider is located at the [facility] office. The participants in this visit include the listed provider and patient. Ann Woods was unable to use realtime audiovisual technology today and the telehealth visit was conducted via telephone.  Chief Complaint: OBESITY Ann Woods is here to discuss her progress with her obesity treatment plan along with follow-up of her obesity related diagnoses. Ann Woods is on keeping a food journal and adhering to recommended goals of 1800-1900 calories and 90 grams of protein daily and states she is following her eating plan approximately 100% of the time. Ann Woods states she is doing 0 minutes 0 times per week.  Today's visit was #: 24 Starting weight: 403 lbs Starting date: 08/31/2018  Interim History: Ann Woods is not getting enough calories and averages  1200 calories daily. She is prescribed 1800 calories per day.  She is skipping breakfast on work days. She does get more calories in on her days off.  She reports her blood pressure today was 139/84 at home.  Subjective:   1. Vitamin D deficiency Ann Woods is on Vit D every 3 days, but her level is still not at goal. It has came up to 30 from 26.9. She notes fatigue but it has improved. I discussed labs with the patient today.  2. Non-restorative sleep Ann Woods notes snoring, and she reports occasional insomnia. She reports sometimes not feeling well rested in the morning quite often. She averages 6 hours of sleep per night.  3. Low HDL (under 40) Ann Woods's HDL is low at 36, and her triglycerides and LDL are at goal.  Assessment/Plan:   1. Vitamin D deficiency Low Vitamin D level contributes to  fatigue and are associated with obesity, breast, and colon cancer. Ann Woods agreed to continue taking prescription vitamin D 50,000 IU every 3 days and will follow-up for routine testing of Vitamin D, at least 2-3 times per year to avoid over-replacement.  2. Non-restorative sleep We will refer for a sleep study evaluation and will follow up.  - Ambulatory referral to Sleep Studies  3. Low HDL (under 40) Ann Woods was advised to increase her exercise.  4. Class 3 severe obesity with serious comorbidity and body mass index (BMI) of 60.0 to 69.9 in adult, unspecified obesity type [facility]) Ann Woods is currently in the action stage of change. As such, her goal is to continue with weight loss efforts. She has agreed to keeping a food journal and adhering to recommended goals of 1800-1900 calories and 90 grams of protein daily.   We discussed increasing calories to 1800 daily.  Exercise goals: Ann Woods is to start with some exercise.  Behavioral modification strategies: no skipping meals and planning for success.  Ann Woods has agreed to follow-up with our clinic in 3 weeks. She was informed of the importance of frequent follow-up visits to maximize her success with intensive lifestyle modifications for her multiple health conditions.  Objective:   VITALS: Per patient if applicable, see vitals. GENERAL: Alert and in no acute distress. CARDIOPULMONARY: No increased WOB. Speaking in clear sentences.  PSYCH: Pleasant and cooperative. Speech normal rate and rhythm. Affect is appropriate. Insight and judgement are appropriate. Attention is focused, linear, and appropriate.  NEURO: Oriented as arrived to appointment on time  with no prompting.   Lab Results  Component Value Date   CREATININE 0.75 08/15/2019   BUN 6 (L) 08/15/2019   NA 141 08/15/2019   K 4.1 08/15/2019   CL 105 08/15/2019   CO2 27 08/15/2019   Lab Results  Component Value Date   ALT 12 08/15/2019   AST 18 08/15/2019   ALKPHOS 96  06/23/2019   BILITOT 0.8 08/15/2019   Lab Results  Component Value Date   HGBA1C 5.8 (H) 10/10/2019   HGBA1C 6.0 (H) 06/23/2019   HGBA1C 5.8 (H) 02/03/2019   HGBA1C 5.7 (H) 08/31/2018   HGBA1C 5.7 (H) 03/02/2018   Lab Results  Component Value Date   INSULIN 20.8 10/10/2019   INSULIN 16.0 06/23/2019   INSULIN 12.7 02/03/2019   INSULIN 10.4 08/31/2018   Lab Results  Component Value Date   TSH 1.520 06/23/2019   Lab Results  Component Value Date   CHOL 154 10/10/2019   HDL 36 (L) 10/10/2019   LDLCALC 96 10/10/2019   TRIG 122 10/10/2019   CHOLHDL 3.3 03/02/2018   Lab Results  Component Value Date   WBC 3.4 08/10/2019   HGB 11.9 08/10/2019   HCT 36.9 08/10/2019   MCV 79 08/10/2019   PLT 239 08/10/2019   Lab Results  Component Value Date   IRON 34 02/03/2019   TIBC 355 02/03/2019   FERRITIN 23 02/03/2019    Attestation Statements:   Reviewed by clinician on day of visit: allergies, medications, problem list, medical history, surgical history, family history, social history, and previous encounter notes.  Time spent on visit including pre-visit chart review and post-visit charting and care was 12 minutes.    Wilhemena Durie, am acting as Location manager for [employer], FNP-C.  I have reviewed the above documentation for accuracy and completeness, and I agree with the above. - Georgianne Fick, FNP

## 2019-11-01 DIAGNOSIS — E786 Lipoprotein deficiency: Secondary | ICD-10-CM | POA: Insufficient documentation

## 2019-11-02 ENCOUNTER — Other Ambulatory Visit: Payer: Self-pay

## 2019-11-02 ENCOUNTER — Ambulatory Visit (INDEPENDENT_AMBULATORY_CARE_PROVIDER_SITE_OTHER): Payer: Managed Care, Other (non HMO) | Admitting: Podiatry

## 2019-11-02 DIAGNOSIS — M722 Plantar fascial fibromatosis: Secondary | ICD-10-CM

## 2019-11-02 DIAGNOSIS — B353 Tinea pedis: Secondary | ICD-10-CM | POA: Diagnosis not present

## 2019-11-02 DIAGNOSIS — M79672 Pain in left foot: Secondary | ICD-10-CM | POA: Diagnosis not present

## 2019-11-02 DIAGNOSIS — B351 Tinea unguium: Secondary | ICD-10-CM | POA: Diagnosis not present

## 2019-11-03 ENCOUNTER — Encounter: Payer: Self-pay | Admitting: Podiatry

## 2019-11-03 NOTE — Progress Notes (Signed)
Subjective:  Patient ID: Ann Woods, female    DOB: 10-15-1975,  MRN: [id number]  Chief Complaint  Patient presents with  . Nail Problem    pt is here for bil toenail fungus, pt also states that she is possibly here for lamisil therapy.    44 y.o. female presents with the above complaint.  Patient is a diabetic with last A1c of 6.0.  Patient states that she has right hallux nail that is thick dystrophic painful and she is concerned that she has fungus in it.  She also has secondary complaint of bilateral athlete's foot with associated epidermal lysis of the superficial skin and itching associated with it.  She states the onychomycosis has been going on for years and has progressively gotten worse.  She denies taking care of it she denies doing anything for it and would like some sort of treatment options at this time.  She also states that her bilateral athlete's foot has also been causing a lot of issues over the long period of time she has not tried anything except some over-the-counter powder.  She denies any other acute complaints at this time.   Review of Systems: Negative except as noted in the HPI. Denies N/V/F/Ch.  Past Medical History:  Diagnosis Date  . Anemia   . Hepatitis B   . High cholesterol   . Hypertension    G2 - preeclampsia, CHTN  . Infertility, female   . Lactose intolerance   . Obesity   . Obesity   . Prediabetes 04/01/2018  . Seasonal allergies   . Shortness of breath   . Swelling of both lower extremities   . Urticaria     Current Outpatient Medications:  .  albuterol (VENTOLIN HFA) 108 (90 Base) MCG/ACT inhaler, Inhale 2 puffs into the lungs every 6 (six) hours as needed for wheezing or shortness of breath., Disp: , Rfl:  .  amLODipine-benazepril (LOTREL) 5-10 MG capsule, Take 1 capsule by mouth daily., Disp: 30 capsule, Rfl: 0 .  azelastine (ASTELIN) 0.1 % nasal spray, Use 1 spray in each nostril twice daily, Disp: 30 mL, Rfl: 5 .  azelastine  (OPTIVAR) 0.05 % ophthalmic solution, Use 1 drop in each eye twice daily as needed, Disp: 6 mL, Rfl: 5 .  cetirizine (ZYRTEC ALLERGY) 10 MG tablet, Take 1 tablet (10 mg total) by mouth 2 (two) times daily., Disp: 60 tablet, Rfl: 5 .  clotrimazole (LOTRIMIN) 1 % cream, Apply 1 application topically 2 (two) times daily., Disp: 30 g, Rfl: 0 .  famotidine (PEPCID) 20 MG tablet, Take 1 tablet (20 mg total) by mouth 2 (two) times daily., Disp: 60 tablet, Rfl: 5 .  ferrous sulfate 325 (65 FE) MG tablet, Take 1 tablet (325 mg total) by mouth daily with breakfast., Disp: 30 tablet, Rfl: 0 .  levocetirizine (XYZAL) 5 MG tablet, Take 5 mg by mouth every evening., Disp: , Rfl:  .  methocarbamol (ROBAXIN) 500 MG tablet, Take 1 tablet (500 mg total) by mouth 4 (four) times daily., Disp: 30 tablet, Rfl: 0 .  montelukast (SINGULAIR) 10 MG tablet, Take 1 tablet (10 mg total) by mouth at bedtime., Disp: 30 tablet, Rfl: 5 .  Olopatadine HCl (PAZEO) 0.7 % SOLN, Place 1 drop into both eyes daily., Disp: 2.5 mL, Rfl: 5 .  terbinafine (LAMISIL) 250 MG tablet, Take 1 tablet (250 mg total) by mouth daily., Disp: 90 tablet, Rfl: 0 .  [name] (CYANOCOBALAMIN) 500 MCG tablet, Take 500 mcg  by mouth daily., Disp: , Rfl:  .  Vitamin D, Ergocalciferol, (DRISDOL) 1.25 MG (50000 UT) CAPS capsule, Take 1 capsule (50,000 Units total) by mouth every 3 (three) days., Disp: 10 capsule, Rfl: 0 .  zinc gluconate 50 MG tablet, Take 50 mg by mouth daily., Disp: , Rfl:   Social History   Tobacco Use  Smoking Status Former Smoker  . Years: 0.50  . Types: Cigarettes  . Quit date: 08/25/1993  . Years since quitting: 26.2  Smokeless Tobacco Never Used  Tobacco Comment   one pack of cigarettes weekly- 05/27/18    No Known Allergies Objective:   There were no vitals filed for this visit. There is no height or weight on file to calculate BMI. Constitutional Well developed. Well nourished.  Vascular Dorsalis pedis pulses palpable  bilaterally. Posterior tibial pulses palpable bilaterally. Capillary refill normal to all digits.  No cyanosis or clubbing noted. Pedal hair growth normal.  Neurologic Normal speech. Oriented to person, place, and time. Epicritic sensation to light touch grossly present bilaterally.  Dermatologic  right hallux dystrophic elongated dark discoloration mycotic toenails noted.  Mild pain on palpation. Bilateral plantar epidermal lysis with associated itching noted across the entire plantar surface.  Orthopedic: Normal joint ROM without pain or crepitus bilaterally. No visible deformities. Tender to palpation at the calcaneal tuber left. No pain with calcaneal squeeze left. Ankle ROM diminished range of motion left. Silfverskiold Test: positive left.   Radiographs: None. Assessment:   1. Nail fungus   2. Plantar fasciitis of left foot   3. Tinea pedis of both feet   4. Pain of left heel    Plan:  Patient was evaluated and treated and all questions answered.  Onychomycosis right hallux nail -Patient would like to hold off on onychomycosis treatment of the nail.  Patient will think about the side effect associated with it.  Bilateral tinea pedis -Resolving with clotrimazole cream.  Plantar Fasciitis, left - XR reviewed as above.  - Educated on icing and stretching. Instructions given.  - Injection delivered to the plantar fascia as below. - DME: Plantar Fascial Brace - Pharmacologic management: None  Procedure: Injection Tendon/Ligament Location: Left plantar fascia at the glabrous junction; medial approach. Skin Prep: alcohol Injectate: 0.5 cc 0.5% marcaine plain, 0.5 cc of 1% Lidocaine, 0.5 cc kenalog 10. Disposition: Patient tolerated procedure well. Injection site dressed with a band-aid.  No follow-ups on file. No follow-ups on file.

## 2019-11-10 ENCOUNTER — Encounter: Payer: Self-pay | Admitting: Neurology

## 2019-11-10 ENCOUNTER — Other Ambulatory Visit: Payer: Self-pay

## 2019-11-10 ENCOUNTER — Ambulatory Visit: Payer: Managed Care, Other (non HMO) | Admitting: Neurology

## 2019-11-10 VITALS — BP 162/87 | HR 66 | Temp 97.3°F | Ht 65.0 in | Wt 385.0 lb

## 2019-11-10 DIAGNOSIS — G4719 Other hypersomnia: Secondary | ICD-10-CM | POA: Diagnosis not present

## 2019-11-10 DIAGNOSIS — R519 Headache, unspecified: Secondary | ICD-10-CM

## 2019-11-10 DIAGNOSIS — R0683 Snoring: Secondary | ICD-10-CM

## 2019-11-10 DIAGNOSIS — Z9189 Other specified personal risk factors, not elsewhere classified: Secondary | ICD-10-CM

## 2019-11-10 DIAGNOSIS — Z6841 Body Mass Index (BMI) 40.0 and over, adult: Secondary | ICD-10-CM

## 2019-11-10 DIAGNOSIS — R351 Nocturia: Secondary | ICD-10-CM

## 2019-11-10 NOTE — Progress Notes (Signed)
Subjective:    Ann Woods ID: Ann Ann Woods is a 44 y.o. female.  HPI     Star Age, MD, PhD [facility] [address], [address]O. [place], [place] [address]  Dear Arrie Aran,   I saw your Ann Woods, Ann Ann Woods, upon your kind request, in my sleep clinic today for initial consultation of Ann Ann Woods sleep disorder, in particular, concern for underlying obstructive sleep apnea.  Ann Ann Woods is unaccompanied today.  As you know, Ann Ann Woods is a 44 year old right-handed woman with an underlying medical history of prediabetes, hypertension, hyperlipidemia, hepatitis B, anemia, seasonal allergies, lower extremity swelling and morbid obesity with a BMI of over 60, who reports snoring and excessive daytime somnolence.  I reviewed Ann office note from 10/31/2019.  Ann Ann Woods Epworth sleepiness score is 15/24, fatigue severity score is 41/63. She has had occasional AM headaches. She has nocturia about 1/night. Bedtime is around 10, but she has difficulty falling asleep, may be awake till 1 AM. Rise time is 5:45 AM. She lives with Ann Ann Woods BF and 2 kids, ages 64 and 35. They have 1 dog in Ann Ann home. TV in Ann bedroom tends to stay on at night. She is a NS and does not drink alcohol, no daily caffeine.  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  Ann Ann Woods Past Medical History Is Significant For: Past Medical History:  Diagnosis Date  . Anemia   . Hepatitis B   . High cholesterol   . Hypertension    G2 - preeclampsia, CHTN  . Infertility, female   . Lactose intolerance   . Obesity   . Obesity   . Prediabetes 04/01/2018  . Seasonal allergies   . Shortness of breath   . Swelling of both lower extremities   . Urticaria     Ann Ann Woods Past Surgical History Is Significant For: Past Surgical History:  Procedure Laterality Date  . CESAREAN SECTION    . HIP SURGERY    . TUBAL LIGATION Bilateral 05/05/2014   Procedure: POST PARTUM TUBAL LIGATION;  Surgeon: Donnamae Jude, MD;  Location:  [facility];  Service: Gynecology;  Laterality: Bilateral;    Ann Ann Woods Family History Is Significant For: Family History  Problem Relation Age of Onset  . Heart disease Mother   . Thyroid disease Mother   . High blood pressure Mother   . Obesity Mother   . COPD Father   . Allergic rhinitis Neg Hx   . Angioedema Neg Hx   . Asthma Neg Hx   . Eczema Neg Hx   . Immunodeficiency Neg Hx   . Urticaria Neg Hx     Ann Ann Woods Social History Is Significant For: Social History   Socioeconomic History  . Marital status: Significant Other    Spouse name: Not on file  . Number of children: Not on file  . Years of education: Not on file  . Highest education level: Not on file  Occupational History  . Occupation: Glass blower/designer  Tobacco Use  . Smoking status: Former Smoker    Years: 0.50    Types: Cigarettes    Quit date: 08/25/1993    Years since quitting: 26.2  . Smokeless tobacco: Never Used  . Tobacco comment: one pack of cigarettes weekly- 05/27/18  Substance and Sexual Activity  . Alcohol use: Not Currently  . Drug use: No  . Sexual activity: Yes    Birth control/protection: None  Other Topics Concern  . Not on file  Social History  Narrative  . Not on file   Social Determinants of Health   Financial Resource Strain:   . Difficulty of Paying Living Expenses:   Food Insecurity:   . Worried About Charity fundraiser in Ann Last Year:   . Arboriculturist in Ann Last Year:   Transportation Needs:   . Film/video editor (Medical):   [name] Lack of Transportation (Non-Medical):   Physical Activity:   . Days of Exercise per Week:   . Minutes of Exercise per Session:   Stress:   . Feeling of Stress :   Social Connections:   . Frequency of Communication with Friends and Family:   . Frequency of Social Gatherings with Friends and Family:   . Attends Religious Services:   . Active Member of Clubs or Organizations:   . Attends Archivist Meetings:   [name] Marital Status:     Ann Ann Woods  Allergies Are:  No Known Allergies:   Ann Ann Woods Current Medications Are:  Outpatient Encounter Medications as of 11/10/2019  Medication Sig  . albuterol (VENTOLIN HFA) 108 (90 Base) MCG/ACT inhaler Inhale 2 puffs into Ann lungs every 6 (six) hours as needed for wheezing or shortness of breath.  [name] amLODipine-benazepril (LOTREL) 5-10 MG capsule Take 1 capsule by mouth daily.  [name] azelastine (ASTELIN) 0.1 % nasal spray Use 1 spray in each nostril twice daily  . azelastine (OPTIVAR) 0.05 % ophthalmic solution Use 1 drop in each eye twice daily as needed  . cetirizine (ZYRTEC ALLERGY) 10 MG tablet Take 1 tablet (10 mg total) by mouth 2 (two) times daily.  . clotrimazole (LOTRIMIN) 1 % cream Apply 1 application topically 2 (two) times daily.  . famotidine (PEPCID) 20 MG tablet Take 1 tablet (20 mg total) by mouth 2 (two) times daily.  . ferrous sulfate 325 (65 FE) MG tablet Take 1 tablet (325 mg total) by mouth daily with breakfast.  . levocetirizine (XYZAL) 5 MG tablet Take 5 mg by mouth every evening.  . methocarbamol (ROBAXIN) 500 MG tablet Take 1 tablet (500 mg total) by mouth 4 (four) times daily.  . montelukast (SINGULAIR) 10 MG tablet Take 1 tablet (10 mg total) by mouth at bedtime.  . Olopatadine HCl (PAZEO) 0.7 % SOLN Place 1 drop into both eyes daily.  [name] terbinafine (LAMISIL) 250 MG tablet Take 1 tablet (250 mg total) by mouth daily.  . [name] (CYANOCOBALAMIN) 500 MCG tablet Take 500 mcg by mouth daily.  . Vitamin D, Ergocalciferol, (DRISDOL) 1.25 MG (50000 UT) CAPS capsule Take 1 capsule (50,000 Units total) by mouth every 3 (three) days.  [name] zinc gluconate 50 MG tablet Take 50 mg by mouth daily.   No facility-administered encounter medications on file as of 11/10/2019.  :  Review of Systems:  Out of a complete 14 point review of systems, all are reviewed and negative with Ann exception of these symptoms as listed below: Review of Systems  Neurological:       Pt presents today for sleep  consult. Never had study and she does snore. Epworth Sleepiness Scale 0= would never doze 1= slight chance of dozing 2= moderate chance of dozing 3= high chance of dozing  Sitting and reading:2 Watching TV:2 Sitting inactive in a public place (ex. Theater or meeting):2 As a passenger in a car for an hour without a break:2 Lying down to rest in Ann afternoon:3 Sitting and talking to someone:1 Sitting quietly after lunch (no alcohol):2 In a car,  while stopped in traffic:1 Total:15    Objective:  Neurological Exam  Physical Exam Physical Examination:   Vitals:   11/10/19 1111  BP: (!) 162/87  Pulse: 66  Temp: (!) 97.3 F (36.3 C)    General Examination: Ann Ann Woods is a very pleasant 44 y.o. female in no acute distress. She appears well-developed and well-nourished and well groomed.   HEENT: Normocephalic, atraumatic, pupils are equal, round and reactive to light, extraocular tracking is good without limitation to gaze excursion or nystagmus noted. Corrective eye glasses in place. Hearing is grossly intact. Face is symmetric with normal facial animation. Speech is clear with no dysarthria noted. There is no hypophonia. There is no lip, neck/head, jaw or voice tremor. Neck is supple with full range of passive and active motion. There are no carotid bruits on auscultation. Oropharynx exam reveals: mild mouth dryness, adequate dental hygiene and moderate airway crowding, due to tonsils of 2+, larger uvula, thicker tongue. Mallampati is class II. Tongue protrudes centrally and palate elevates symmetrically. Neck size is 17.25 inches. She has a minimal overbite.   Chest: Clear to auscultation without wheezing, rhonchi or crackles noted.  Heart: S1+S2+0, regular and normal without murmurs, rubs or gallops noted.   Abdomen: Soft, non-tender and non-distended with normal bowel sounds appreciated on auscultation.  Extremities: There is no pitting edema in Ann distal lower extremities  bilaterally.   Skin: Warm and dry without trophic changes noted.   Musculoskeletal: exam reveals no obvious joint deformities, tenderness or joint swelling or erythema.   Neurologically:  Mental status: Ann Ann Woods is awake, alert and oriented in all 4 spheres. Ann Ann Woods immediate and remote memory, attention, language skills and fund of knowledge are appropriate. There is no evidence of aphasia, agnosia, apraxia or anomia. Speech is clear with normal prosody and enunciation. Thought process is linear. Mood is normal and affect is normal.  Cranial nerves II - XII are as described above under HEENT exam.  Motor exam: Normal bulk, strength and tone is noted. There is no tremor, Romberg is negative. Fine motor skills and coordination: grossly intact.  Cerebellar testing: No dysmetria or intention tremor. There is no truncal or gait ataxia.  Sensory exam: intact to light touch in Ann upper and lower extremities.  Gait, station and balance: She stands easily. No veering to one side is noted. No leaning to one side is noted. Posture is age-appropriate and stance is narrow based. Gait shows normal stride length and normal pace. No problems turning are noted. Tandem walk is slightly challenging, likely d/t body habitus.                Assessment and Plan:   In summary, Ann Ann Woods is a very pleasant 44 y.o.-year old female with an underlying medical history of prediabetes, hypertension, hyperlipidemia, hepatitis B, anemia, seasonal allergies, lower extremity swelling and morbid obesity with a BMI of over 60, whose history and physical exam are concerning for obstructive sleep apnea (OSA). I had a long chat with Ann Ann Woods about my findings and Ann diagnosis of OSA, its prognosis and treatment options. We talked about medical treatments, surgical interventions and non-pharmacological approaches. I explained in particular Ann risks and ramifications of untreated moderate to severe OSA, especially with  respect to developing cardiovascular disease down Ann Road, including congestive heart failure, difficult to treat hypertension, cardiac arrhythmias, or stroke. Even type 2 diabetes has, in part, been linked to untreated OSA. Symptoms of untreated OSA include daytime sleepiness, memory problems,  mood irritability and mood disorder such as depression and anxiety, lack of energy, as well as recurrent headaches, especially morning headaches. We talked about trying to maintain a healthy lifestyle in general, as well as Ann importance of weight control. We also talked about Ann importance of good sleep hygiene. I recommended Ann following at this time: sleep study.  I explained Ann sleep test procedure to Ann Ann Woods and also outlined possible surgical and non-surgical treatment options of OSA, including Ann use of a custom-made dental device (which would require a referral to a specialist dentist or oral surgeon), upper airway surgical options, such as traditional UPPP or a novel less invasive surgical option in Ann form of Inspire hypoglossal nerve stimulation (which would involve a referral to an ENT surgeon). I also explained Ann CPAP treatment option to Ann Ann Woods, who indicated that she would be willing to try CPAP if Ann need arises. I explained Ann importance of being compliant with PAP treatment, not only for insurance purposes but primarily to improve Ann Ann Woods symptoms, and for Ann Ann Woods's long term health benefit, including to reduce Ann Ann Woods cardiovascular risks. I answered all Ann Ann Woods questions today and Ann Ann Woods was in agreement. I plan to see Ann Ann Woods back after Ann sleep study is completed and encouraged Ann Ann Woods to call with any interim questions, concerns, problems or updates.   Thank you very much for allowing me to participate in Ann care of this nice Ann Woods. If I can be of any further assistance to you please do not hesitate to call me at [phone].  Sincerely,   Star Age, MD, PhD

## 2019-11-10 NOTE — Patient Instructions (Signed)

## 2019-11-21 ENCOUNTER — Encounter (INDEPENDENT_AMBULATORY_CARE_PROVIDER_SITE_OTHER): Payer: Self-pay | Admitting: Family Medicine

## 2019-11-21 ENCOUNTER — Ambulatory Visit (INDEPENDENT_AMBULATORY_CARE_PROVIDER_SITE_OTHER): Payer: Managed Care, Other (non HMO) | Admitting: Family Medicine

## 2019-11-21 ENCOUNTER — Other Ambulatory Visit: Payer: Self-pay

## 2019-11-21 VITALS — BP 129/84 | HR 86 | Temp 97.9°F | Ht 65.0 in | Wt 384.0 lb

## 2019-11-21 DIAGNOSIS — G4710 Hypersomnia, unspecified: Secondary | ICD-10-CM | POA: Diagnosis not present

## 2019-11-21 DIAGNOSIS — R7303 Prediabetes: Secondary | ICD-10-CM | POA: Diagnosis not present

## 2019-11-21 DIAGNOSIS — Z6841 Body Mass Index (BMI) 40.0 and over, adult: Secondary | ICD-10-CM

## 2019-11-21 NOTE — Progress Notes (Signed)
Chief Complaint:   OBESITY Ann Woods is here to discuss her progress with her obesity treatment plan along with follow-up of her obesity related diagnoses. Ann Woods is on keeping a food journal and adhering to recommended goals of 1800-1900 calories and 90 grams of protein daily and states she is following her eating plan approximately 100% of the time. Ann Woods states she is doing 0 minutes 0 times per week.  Today's visit was #: 25 Starting weight: 403 lbs Starting date: 08/31/2018 Today's weight: 384 lbs Today's date: 11/21/2019 Total lbs lost to date: 19 Total lbs lost since last in-office visit: 2  Interim History: Ann Woods has increased her protein in the morning by adding more protein powder to the shakes she has most days. She is journaling almost everyday. She is now getting about 1400 calories per day. She is getting about 80 grams of protein in per day.  She is still following a vegan diet.  Subjective:   1. Pre-diabetes Ann Woods denies polyphagia, and she is not on metformin. Lab Results  Component Value Date   HGBA1C 5.8 (H) 10/10/2019    2. Sleeping excessive daytime Ann Woods met with Dr. Rexene Alberts on 11/10/2019 and he has ordered a sleep study. This has not been scheduled yet.  Assessment/Plan:   1. Pre-diabetes Ann Woods will continue her meal plan, and will continue to work on weight loss, exercise, and decreasing simple carbohydrates to help decrease the risk of diabetes.   2. Sleeping excessive daytime Ann Woods will follow up with Dr. Guadelupe Sabin office if she has not heard from them by the end of this week to schedule her sleep study.  3. Class 3 severe obesity with serious comorbidity and body mass index (BMI) of 60.0 to 69.9 in adult, unspecified obesity type [facility]) Ann Woods is currently in the action stage of change. As such, her goal is to continue with weight loss efforts. She has agreed to keeping a food journal and adhering to recommended goals of 1800-1900 calories and  90 grams of protein daily.   Ann Woods will strive to increase her protein to 90 grams per day.  Exercise goals: Ann Woods was encouraged to increase activity as much as possible.  Behavioral modification strategies: increasing lean protein intake and keeping a strict food journal.  Ann Woods has agreed to follow-up with our clinic in 3 weeks. She was informed of the importance of frequent follow-up visits to maximize her success with intensive lifestyle modifications for her multiple health conditions.   Objective:   Blood pressure 129/84, pulse 86, temperature 97.9 F (36.6 C), temperature source Oral, height 5' 5"  (1.651 m), weight (!) 384 lb (174.2 kg), SpO2 99 %. Body mass index is 63.9 kg/m.  General: Cooperative, alert, well developed, in no acute distress. HEENT: Conjunctivae and lids unremarkable. Cardiovascular: Regular rhythm.  Lungs: Normal work of breathing. Neurologic: No focal deficits.   Lab Results  Component Value Date   CREATININE 0.75 08/15/2019   BUN 6 (L) 08/15/2019   NA 141 08/15/2019   K 4.1 08/15/2019   CL 105 08/15/2019   CO2 27 08/15/2019   Lab Results  Component Value Date   ALT 12 08/15/2019   AST 18 08/15/2019   ALKPHOS 96 06/23/2019   BILITOT 0.8 08/15/2019   Lab Results  Component Value Date   HGBA1C 5.8 (H) 10/10/2019   HGBA1C 6.0 (H) 06/23/2019   HGBA1C 5.8 (H) 02/03/2019   HGBA1C 5.7 (H) 08/31/2018   HGBA1C 5.7 (H) 03/02/2018   Lab Results  Component Value Date   INSULIN 20.8 10/10/2019   INSULIN 16.0 06/23/2019   INSULIN 12.7 02/03/2019   INSULIN 10.4 08/31/2018   Lab Results  Component Value Date   TSH 1.520 06/23/2019   Lab Results  Component Value Date   CHOL 154 10/10/2019   HDL 36 (L) 10/10/2019   LDLCALC 96 10/10/2019   TRIG 122 10/10/2019   CHOLHDL 3.3 03/02/2018   Lab Results  Component Value Date   WBC 3.4 08/10/2019   HGB 11.9 08/10/2019   HCT 36.9 08/10/2019   MCV 79 08/10/2019   PLT 239 08/10/2019    Lab Results  Component Value Date   IRON 34 02/03/2019   TIBC 355 02/03/2019   FERRITIN 23 02/03/2019   Attestation Statements:   Reviewed by clinician on day of visit: allergies, medications, problem list, medical history, surgical history, family history, social history, and previous encounter notes.   Wilhemena Durie, am acting as Location manager for [employer], FNP-C.  I have reviewed the above documentation for accuracy and completeness, and I agree with the above. -  Georgianne Fick, FNP

## 2019-11-24 ENCOUNTER — Telehealth: Payer: Self-pay

## 2019-11-24 NOTE — Telephone Encounter (Signed)
Returned call back to patient. Pt needs to schedule home sleep test. U/a to reach patient and LVM.

## 2019-12-05 ENCOUNTER — Ambulatory Visit: Payer: Managed Care, Other (non HMO) | Admitting: Podiatry

## 2019-12-12 ENCOUNTER — Ambulatory Visit (INDEPENDENT_AMBULATORY_CARE_PROVIDER_SITE_OTHER): Payer: Managed Care, Other (non HMO) | Admitting: Family Medicine

## 2019-12-12 ENCOUNTER — Ambulatory Visit (INDEPENDENT_AMBULATORY_CARE_PROVIDER_SITE_OTHER): Payer: Managed Care, Other (non HMO) | Admitting: Neurology

## 2019-12-12 ENCOUNTER — Other Ambulatory Visit: Payer: Self-pay

## 2019-12-12 ENCOUNTER — Encounter (INDEPENDENT_AMBULATORY_CARE_PROVIDER_SITE_OTHER): Payer: Self-pay | Admitting: Family Medicine

## 2019-12-12 VITALS — BP 135/85 | HR 76 | Temp 98.1°F | Ht 65.0 in | Wt 381.0 lb

## 2019-12-12 DIAGNOSIS — G4733 Obstructive sleep apnea (adult) (pediatric): Secondary | ICD-10-CM | POA: Diagnosis not present

## 2019-12-12 DIAGNOSIS — R0683 Snoring: Secondary | ICD-10-CM

## 2019-12-12 DIAGNOSIS — Z6841 Body Mass Index (BMI) 40.0 and over, adult: Secondary | ICD-10-CM

## 2019-12-12 DIAGNOSIS — G4719 Other hypersomnia: Secondary | ICD-10-CM

## 2019-12-12 DIAGNOSIS — Z9189 Other specified personal risk factors, not elsewhere classified: Secondary | ICD-10-CM

## 2019-12-12 DIAGNOSIS — R7303 Prediabetes: Secondary | ICD-10-CM

## 2019-12-12 DIAGNOSIS — R351 Nocturia: Secondary | ICD-10-CM

## 2019-12-12 DIAGNOSIS — R519 Headache, unspecified: Secondary | ICD-10-CM

## 2019-12-13 NOTE — Progress Notes (Signed)
Chief Complaint:   OBESITY Ann Woods is here to discuss her progress with her obesity treatment plan along with follow-up of her obesity related diagnoses. Ann Woods is on keeping a food journal and adhering to recommended goals of 1800-1900 calories and 90 grams of protein daily and states she is following her eating plan approximately 100% of the time. Ann Woods states she is walking for 20 minutes 2 times per week.  Today's visit was #: 26 Starting weight: 403 lbs Starting date: 08/31/2018 Today's weight: 381 lbs Today's date: 12/12/2019 Total lbs lost to date: 22 Total lbs lost since last in-office visit: 3  Interim History: Ann Woods is meeting protein and calorie goals. She follows a vegan diet. She makes homemade shakes using pea protein for breakfast. She works almost 7 days per week and is not exercising much due to lack of time. She is journaling daily. She is trying to limit carbohydrates in her diet.  She does not plan on getting the COVID vaccine for now.  Subjective:   1. Pre-diabetes Kasee's last A1c was 5.8, but has been as high as 6.0. She is working on limiting carbohydrates. She denies polyphagia. She is curious as to what type of bread to choose.  Assessment/Plan:   1. Pre-diabetes Chondra will continue to work on weight loss, exercise, and decreasing simple carbohydrates to help decrease the risk of diabetes. We discussed the best choice for bread (100% whole wheat flour listed as first ingedient).  2. Class 3 severe obesity with serious comorbidity and body mass index (BMI) of 60.0 to 69.9 in adult, unspecified obesity type [facility]) Cataleia is currently in the action stage of change. As such, her goal is to continue with weight loss efforts. She has agreed to keeping a food journal and adhering to recommended goals of 1800-1900 calories and 90 grams of protein daily.   Exercise goals: As is.  Behavioral modification strategies: increasing lean protein intake,  decreasing simple carbohydrates and keeping a strict food journal.  Ann Woods has agreed to follow-up with our clinic in 3 weeks. She was informed of the importance of frequent follow-up visits to maximize her success with intensive lifestyle modifications for her multiple health conditions.   Objective:   Blood pressure 135/85, pulse 76, temperature 98.1 F (36.7 C), temperature source Oral, height 5\' 5"  (1.651 m), weight (!) 381 lb (172.8 kg), SpO2 98 %. Body mass index is 63.4 kg/m.  General: Cooperative, alert, well developed, in no acute distress. HEENT: Conjunctivae and lids unremarkable. Cardiovascular: Regular rhythm.  Lungs: Normal work of breathing. Neurologic: No focal deficits.   Lab Results  Component Value Date   CREATININE 0.75 08/15/2019   BUN 6 (L) 08/15/2019   NA 141 08/15/2019   K 4.1 08/15/2019   CL 105 08/15/2019   CO2 27 08/15/2019   Lab Results  Component Value Date   ALT 12 08/15/2019   AST 18 08/15/2019   ALKPHOS 96 06/23/2019   BILITOT 0.8 08/15/2019   Lab Results  Component Value Date   HGBA1C 5.8 (H) 10/10/2019   HGBA1C 6.0 (H) 06/23/2019   HGBA1C 5.8 (H) 02/03/2019   HGBA1C 5.7 (H) 08/31/2018   HGBA1C 5.7 (H) 03/02/2018   Lab Results  Component Value Date   INSULIN 20.8 10/10/2019   INSULIN 16.0 06/23/2019   INSULIN 12.7 02/03/2019   INSULIN 10.4 08/31/2018   Lab Results  Component Value Date   TSH 1.520 06/23/2019   Lab Results  Component Value Date  CHOL 154 10/10/2019   HDL 36 (L) 10/10/2019   LDLCALC 96 10/10/2019   TRIG 122 10/10/2019   CHOLHDL 3.3 03/02/2018   Lab Results  Component Value Date   WBC 3.4 08/10/2019   HGB 11.9 08/10/2019   HCT 36.9 08/10/2019   MCV 79 08/10/2019   PLT 239 08/10/2019   Lab Results  Component Value Date   IRON 34 02/03/2019   TIBC 355 02/03/2019   FERRITIN 23 02/03/2019   Attestation Statements:   Reviewed by clinician on day of visit: allergies, medications, problem list,  medical history, surgical history, family history, social history, and previous encounter notes.   Wilhemena Durie, am acting as Location manager for [employer], FNP-C.  I have reviewed the above documentation for accuracy and completeness, and I agree with the above. -  Georgianne Fick, FNP

## 2019-12-14 ENCOUNTER — Telehealth: Payer: Self-pay

## 2019-12-14 NOTE — Procedures (Signed)
Patient Information     First Name: Ann Last Name: Woods ID: [id number]  Birth Date: May 25, 1976 Age: 44 Gender: Female  Referring Provider: Girtha Rm, NP-C BMI: 64.3 (W=385 lb, H=5' 5'')  Neck Circ.:  63 '' Epworth:  15/24   Sleep Study Information    Study Date: Dec 12, 2019 S/H/A Version: 001.001.001.001 / 4.1.1528 / 49  History:    44 year old woman with a history of prediabetes, hypertension, hyperlipidemia, hepatitis B, anemia, seasonal allergies, lower extremity swelling and morbid obesity with a BMI of over 60, who reports snoring and excessive daytime somnolence. Summary & Diagnosis:     Severe OSA Recommendations:     This home sleep test demonstrates severe obstructive sleep apnea with a total AHI of 86.6/hour and O2 nadir of 58%. Given the patient's medical history and sleep related complaints, treatment with positive airway pressure (in the form of CPAP) is recommended. This will, ideally, require a full night CPAP titration study for proper treatment settings, O2 monitoring and mask fitting. Based on the severity of the sleep disordered breathing an attended titration study is indicated. However, patient's insurance has denied an attended sleep study; therefore, the patient will be advised to proceed with an autoPAP titration/trial at home for now. Please note that untreated obstructive sleep apnea may carry additional perioperative morbidity. Patients with significant obstructive sleep apnea should receive perioperative PAP therapy and the surgeons and particularly the anesthesiologist should be informed of the diagnosis and the severity of the sleep disordered breathing. The patient should be cautioned not to drive, work at heights, or operate dangerous or heavy equipment when tired or sleepy. Review and reiteration of good sleep hygiene measures should be pursued with any patient. Other causes of the patient's symptoms, including circadian rhythm disturbances, an  underlying mood disorder, medication effect and/or an underlying medical problem cannot be ruled out based on this test. Clinical correlation is recommended. The patient and her referring provider will be notified of the test results. The patient will be seen in follow up in sleep clinic at [facility].  I certify that I have reviewed the raw data recording prior to the issuance of this report in accordance with the standards of the American Academy of Sleep Medicine (AASM).  Star Age, MD, PhD [facility]) Diplomat, ABPN (Neurology and Sleep)             Sleep Summary    Oxygen Saturation Statistics     Start Study Time: End Study Time: Total Recording Time:          10:13:16 PM 6:29:21 AM   8 h, 16 min  Total Sleep Time % REM of Sleep Time:  5 h, 56 min  4.8    Mean: 90 Minimum: 58 Maximum: 100  Mean of Desaturations Nadirs (%):   86  Oxygen Desaturation. %:   4-9 10-20 >20 Total  Events Number Total   225  117 60.8 31.6  28 7.6  370 100.0  Oxygen Saturation: <90 <=88 <85 <80 <70  Duration (minutes): Sleep % 81.9 23.0 67.9 47.5 19.1 13.3 32.4 9.1 5.2 1.5     Respiratory Indices      Total Events REM NREM All Night  pRDI:  476  pAHI:  475 ODI:  370  pAHIc:  66 % CSR: 0.0 N/A N/A N/A N/A N/A N/A N/A N/A 86.7 86.6 67.4 12.0       Pulse Rate Statistics during Sleep (BPM)  Mean: 76 Minimum: 44 Maximum: 130    Indices are calculated using technically valid sleep time of 5 h,  29 min.  pRDI/pAHI are calculated using oxi desaturations ? 3% REM/NREM indices appear only if REM time >  30 min.  Body Position Statistics  Position Supine Prone Right Left Non-Supine  Sleep (min) 355.1 0.0 1.0 0.0 1.0  Sleep % 99.7 0.0 0.3 0.0 0.3  pRDI 86.6 N/A N/A N/A N/A  pAHI 86.5 N/A N/A N/A N/A  ODI 67.4 N/A N/A N/A N/A     Snoring Statistics Snoring Level (dB) >40 >50 >60 >70 >80 >Threshold (45)  Sleep (min) 176.3 30.3 2.8 0.0 0.0  76.5  Sleep % 49.5 8.5 0.8 0.0 0.0 21.5    Mean: 43 dB Sleep Stages Chart                                                                             pAHI=86.5                                                                                              Mild              Moderate                    Severe                                                 5              15                    30

## 2019-12-14 NOTE — Progress Notes (Signed)
Patient referred by Jake Bathe, NP, seen by me on 11/10/19, HST on 12/12/19.    Please call and notify the patient that the recent home sleep test showed obstructive sleep apnea in the severe range. While I recommend treatment for this in the form CPAP, her insurance will not approve a sleep study for this. They will likely only approve a trial of autoPAP, which means, that we don't have to bring her in for a sleep study with CPAP, but will let her start using a so called autoPAP machine at home, through a DME company (of her choice, or as per insurance requirement). The DME representative will educate her on how to use the machine, how to put the mask on, etc. I have placed an order in the chart. Please send referral, talk to patient, send report to referring MD. We will need a FU in sleep clinic for 10 weeks post-PAP set up, please arrange that with me or one of our NPs. Thanks,   Star Age, MD, PhD [facility])

## 2019-12-14 NOTE — Telephone Encounter (Signed)
-----   Message from Star Age, MD sent at 12/14/2019  7:28 AM EDT ----- Patient referred by Jake Bathe, NP, seen by me on 11/10/19, HST on 12/12/19.    Please call and notify the patient that the recent home sleep test showed obstructive sleep apnea in the severe range. While I recommend treatment for this in the form CPAP, her insurance will not approve a sleep study for this. They will likely only approve a trial of autoPAP, which means, that we don't have to bring her in for a sleep study with CPAP, but will let her start using a so called autoPAP machine at home, through a DME company (of her choice, or as per insurance requirement). The DME representative will educate her on how to use the machine, how to put the mask on, etc. I have placed an order in the chart. Please send referral, talk to patient, send report to referring MD. We will need a FU in sleep clinic for 10 weeks post-PAP set up, please arrange that with me or one of our NPs. Thanks,   Star Age, MD, PhD [facility])

## 2019-12-14 NOTE — Telephone Encounter (Signed)
I called pt. No answer, left a message asking pt to call me back.   

## 2019-12-14 NOTE — Addendum Note (Signed)
Addended by: Star Age on: 12/14/2019 07:28 AM   Modules accepted: Orders

## 2019-12-15 NOTE — Telephone Encounter (Signed)
Pt called back in regards to missed call  

## 2019-12-15 NOTE — Telephone Encounter (Signed)
I called pt. I advised pt that Dr. Rexene Alberts reviewed their sleep study results and found that pt has severe osa. Dr. Rexene Alberts recommends that pt start on auto pap therapy. I reviewed PAP compliance expectations with the pt. Pt is agreeable to starting an auto-PAP. I advised pt that an order will be sent to a DME, [facility], and [facility] will call the pt within about one week after they file with the pt's insurance. [facility] will show the pt how to use the machine, fit for masks, and troubleshoot the auto-PAP if needed. A follow up appt was made for insurance purposes with Dr. Rexene Alberts on 02/20/2020 at 100 pm . Pt verbalized understanding to arrive 30 minutes early and bring their auto-PAP. A letter with all of this information in it will be mailed to the pt as a reminder. I verified with the pt that the address we have on file is correct. Pt verbalized understanding of results. Pt had no questions at this time but was encouraged to call back if questions arise. I have sent the order to [facility] and have received confirmation that they have received the order.

## 2019-12-19 ENCOUNTER — Telehealth: Payer: Self-pay

## 2019-12-19 ENCOUNTER — Other Ambulatory Visit: Payer: Self-pay

## 2019-12-19 ENCOUNTER — Encounter: Payer: Self-pay | Admitting: Podiatry

## 2019-12-19 ENCOUNTER — Ambulatory Visit (INDEPENDENT_AMBULATORY_CARE_PROVIDER_SITE_OTHER): Payer: Managed Care, Other (non HMO) | Admitting: Podiatry

## 2019-12-19 DIAGNOSIS — M2142 Flat foot [pes planus] (acquired), left foot: Secondary | ICD-10-CM | POA: Diagnosis not present

## 2019-12-19 DIAGNOSIS — M21962 Unspecified acquired deformity of left lower leg: Secondary | ICD-10-CM | POA: Diagnosis not present

## 2019-12-19 DIAGNOSIS — M722 Plantar fascial fibromatosis: Secondary | ICD-10-CM | POA: Diagnosis not present

## 2019-12-19 DIAGNOSIS — M21961 Unspecified acquired deformity of right lower leg: Secondary | ICD-10-CM | POA: Diagnosis not present

## 2019-12-19 DIAGNOSIS — M79672 Pain in left foot: Secondary | ICD-10-CM | POA: Diagnosis not present

## 2019-12-19 DIAGNOSIS — M2141 Flat foot [pes planus] (acquired), right foot: Secondary | ICD-10-CM

## 2019-12-19 NOTE — Telephone Encounter (Signed)
Pt has called and lvm asking for a return call back. Pt has questions and needs clarification about CPAP study and equipment.

## 2019-12-19 NOTE — Telephone Encounter (Signed)
I called pt. No answer, left a message asking pt to call me back.   

## 2019-12-19 NOTE — Telephone Encounter (Signed)
Received message from [employer] stating.   Pt has called and lvm asking for a return call back. Pt has questions and needs clarification about CPAP study and equipment.     I lvm asking for call back.

## 2019-12-20 NOTE — Progress Notes (Signed)
Subjective:  Patient ID: Ann Woods, female    DOB: 14-Feb-1976,  MRN: [id number]  Chief Complaint  Patient presents with  . Nail Problem    i did not take the nail stuff due to the side effects   . Plantar Fasciitis    the shot and the brace did help   . Foot Problem    the cream did help as well     44 y.o. female presents with the above complaint.  Patient is following up with left plantar fasciitis.  Patient states that improvement from injection was considerable for last time.  Patient has wearing her brace which has been helping a lot.  She would like to discuss long-term management of plantar fasciitis.  She has been doing her stretching exercises.  She has been able to ambulate.  She still has some mild discomfort.  Pain scale 3 out of 10.  She states the nail fungus as well as athlete's foot is improving considerably with the topical application.   Review of Systems: Negative except as noted in the HPI. Denies N/V/F/Ch.  Past Medical History:  Diagnosis Date  . Anemia   . Hepatitis B   . High cholesterol   . Hypertension    G2 - preeclampsia, CHTN  . Infertility, female   . Lactose intolerance   . Obesity   . Obesity   . Prediabetes 04/01/2018  . Seasonal allergies   . Shortness of breath   . Swelling of both lower extremities   . Urticaria     Current Outpatient Medications:  .  albuterol (VENTOLIN HFA) 108 (90 Base) MCG/ACT inhaler, Inhale 2 puffs into the lungs every 6 (six) hours as needed for wheezing or shortness of breath., Disp: , Rfl:  .  amLODipine-benazepril (LOTREL) 5-10 MG capsule, Take 1 capsule by mouth daily., Disp: 30 capsule, Rfl: 0 .  azelastine (ASTELIN) 0.1 % nasal spray, Use 1 spray in each nostril twice daily, Disp: 30 mL, Rfl: 5 .  azelastine (OPTIVAR) 0.05 % ophthalmic solution, Use 1 drop in each eye twice daily as needed, Disp: 6 mL, Rfl: 5 .  cetirizine (ZYRTEC ALLERGY) 10 MG tablet, Take 1 tablet (10 mg total) by mouth 2 (two) times  daily., Disp: 60 tablet, Rfl: 5 .  clotrimazole (LOTRIMIN) 1 % cream, Apply 1 application topically 2 (two) times daily., Disp: 30 g, Rfl: 0 .  famotidine (PEPCID) 20 MG tablet, Take 1 tablet (20 mg total) by mouth 2 (two) times daily., Disp: 60 tablet, Rfl: 5 .  ferrous sulfate 325 (65 FE) MG tablet, Take 1 tablet (325 mg total) by mouth daily with breakfast., Disp: 30 tablet, Rfl: 0 .  levocetirizine (XYZAL) 5 MG tablet, Take 5 mg by mouth every evening., Disp: , Rfl:  .  methocarbamol (ROBAXIN) 500 MG tablet, Take 1 tablet (500 mg total) by mouth 4 (four) times daily., Disp: 30 tablet, Rfl: 0 .  montelukast (SINGULAIR) 10 MG tablet, Take 1 tablet (10 mg total) by mouth at bedtime., Disp: 30 tablet, Rfl: 5 .  Olopatadine HCl (PAZEO) 0.7 % SOLN, Place 1 drop into both eyes daily., Disp: 2.5 mL, Rfl: 5 .  terbinafine (LAMISIL) 250 MG tablet, Take 1 tablet (250 mg total) by mouth daily., Disp: 90 tablet, Rfl: 0 .  [name] (CYANOCOBALAMIN) 500 MCG tablet, Take 500 mcg by mouth daily., Disp: , Rfl:  .  Vitamin D, Ergocalciferol, (DRISDOL) 1.25 MG (50000 UT) CAPS capsule, Take 1 capsule (50,000 Units  total) by mouth every 3 (three) days., Disp: 10 capsule, Rfl: 0 .  zinc gluconate 50 MG tablet, Take 50 mg by mouth daily., Disp: , Rfl:   Social History   Tobacco Use  Smoking Status Former Smoker  . Years: 0.50  . Types: Cigarettes  . Quit date: 08/25/1993  . Years since quitting: 26.3  Smokeless Tobacco Never Used  Tobacco Comment   one pack of cigarettes weekly- 05/27/18    No Known Allergies Objective:   There were no vitals filed for this visit. There is no height or weight on file to calculate BMI. Constitutional Well developed. Well nourished.  Vascular Dorsalis pedis pulses palpable bilaterally. Posterior tibial pulses palpable bilaterally. Capillary refill normal to all digits.  No cyanosis or clubbing noted. Pedal hair growth normal.  Neurologic Normal speech. Oriented to  person, place, and time. Epicritic sensation to light touch grossly present bilaterally.  Dermatologic  right hallux dystrophic elongated dark discoloration mycotic toenails noted.  Mild pain on palpation. Bilateral plantar epidermal lysis with associated itching noted across the entire plantar surface.  Orthopedic: Normal joint ROM without pain or crepitus bilaterally. No visible deformities. Mild tender to palpation at the calcaneal tuber left. No pain with calcaneal squeeze left. Ankle ROM diminished range of motion left. Silfverskiold Test: positive left.   Radiographs: None. Assessment:   1. Deformity of both feet   2. Pes planus of both feet   3. Plantar fasciitis of left foot   4. Pain of left heel    Plan:  Patient was evaluated and treated and all questions answered.  Onychomycosis right hallux nail -Patient would like to hold off on onychomycosis treatment of the nail.  Patient will think about the side effect associated with it.  Bilateral tinea pedis -Resolving with clotrimazole cream.  Plantar Fasciitis, left - XR reviewed as above.  - Educated on icing and stretching. Instructions given.  -Second injection delivered to the plantar fascia as below. - DME: Night splint - Pharmacologic management: None  Pes planus/foot deformity -I explained to the patient the etiology of pes planus and various treatment options were discussed.  I related pes planus to underlying plantar fasciitis.  Patient states understanding.  I believe she will benefit from custom-made orthotics to help support the arch of the foot control the hindfoot motion as well as take the stress away from the plantar fascial. -Should be scheduled to see Liliane Channel for custom-made orthotics  Procedure: Injection Tendon/Ligament Location: Left plantar fascia at the glabrous junction; medial approach. Skin Prep: alcohol Injectate: 0.5 cc 0.5% marcaine plain, 0.5 cc of 1% Lidocaine, 0.5 cc kenalog  10. Disposition: Patient tolerated procedure well. Injection site dressed with a band-aid.  Return for See Liliane Channel for orthotics ASAP. Return for See Liliane Channel for orthotics ASAP.

## 2019-12-20 NOTE — Telephone Encounter (Signed)
Pt returned call. Please call back when available. 

## 2019-12-21 NOTE — Telephone Encounter (Signed)
I called pt. No answer, left a message asking pt to call me back.   

## 2019-12-22 ENCOUNTER — Telehealth: Payer: Self-pay

## 2019-12-22 NOTE — Telephone Encounter (Signed)
Pt has left me a voicemail but she is returning your phone call. [phone]

## 2019-12-22 NOTE — Telephone Encounter (Signed)
Barrie Lyme sent message stating.   Pt has left me a voicemail but she is returning your phone call. [phone]

## 2019-12-26 ENCOUNTER — Ambulatory Visit (INDEPENDENT_AMBULATORY_CARE_PROVIDER_SITE_OTHER): Payer: Managed Care, Other (non HMO) | Admitting: Orthotics

## 2019-12-26 ENCOUNTER — Other Ambulatory Visit: Payer: Self-pay

## 2019-12-26 DIAGNOSIS — M2141 Flat foot [pes planus] (acquired), right foot: Secondary | ICD-10-CM

## 2019-12-26 DIAGNOSIS — M21961 Unspecified acquired deformity of right lower leg: Secondary | ICD-10-CM

## 2019-12-26 DIAGNOSIS — M722 Plantar fascial fibromatosis: Secondary | ICD-10-CM

## 2019-12-26 DIAGNOSIS — M21962 Unspecified acquired deformity of left lower leg: Secondary | ICD-10-CM

## 2019-12-26 DIAGNOSIS — M2142 Flat foot [pes planus] (acquired), left foot: Secondary | ICD-10-CM

## 2019-12-26 NOTE — Telephone Encounter (Signed)
I reached out to the pt and we were able to connect. Pt reports she had no questions at this time and is currently working with [facility] on the next steps to get scheduled.

## 2019-12-26 NOTE — Progress Notes (Signed)

## 2020-01-02 ENCOUNTER — Other Ambulatory Visit: Payer: Self-pay

## 2020-01-02 ENCOUNTER — Encounter (INDEPENDENT_AMBULATORY_CARE_PROVIDER_SITE_OTHER): Payer: Self-pay | Admitting: Family Medicine

## 2020-01-02 ENCOUNTER — Ambulatory Visit (INDEPENDENT_AMBULATORY_CARE_PROVIDER_SITE_OTHER): Payer: Managed Care, Other (non HMO) | Admitting: Family Medicine

## 2020-01-02 VITALS — BP 133/80 | HR 79 | Temp 98.0°F | Ht 65.0 in | Wt 385.0 lb

## 2020-01-02 DIAGNOSIS — R7303 Prediabetes: Secondary | ICD-10-CM

## 2020-01-02 DIAGNOSIS — E559 Vitamin D deficiency, unspecified: Secondary | ICD-10-CM

## 2020-01-02 DIAGNOSIS — Z9189 Other specified personal risk factors, not elsewhere classified: Secondary | ICD-10-CM

## 2020-01-02 DIAGNOSIS — Z6841 Body Mass Index (BMI) 40.0 and over, adult: Secondary | ICD-10-CM

## 2020-01-02 MED ORDER — VITAMIN D (ERGOCALCIFEROL) 1.25 MG (50000 UNIT) PO CAPS: 50000.0000 [IU] | ORAL_CAPSULE | ORAL | 0 refills | Status: DC

## 2020-01-03 ENCOUNTER — Encounter (INDEPENDENT_AMBULATORY_CARE_PROVIDER_SITE_OTHER): Payer: Self-pay | Admitting: Family Medicine

## 2020-01-03 NOTE — Progress Notes (Signed)
Chief Complaint:   OBESITY Ann Woods is here to discuss her progress with her obesity treatment plan along with follow-up of her obesity related diagnoses. [name] is on keeping a food journal and adhering to recommended goals of 1800-1900 calories and 90 grams of protein daily and states she is following her eating plan approximately 100% of the time. [name] states she is doing 0 minutes 0 times per week.  Today's visit was #: 26 Starting weight: 403 lbs Starting date: 08/31/2018 Today's weight: 385 lbs Today's date: 01/02/2020 Total lbs lost to date: 18 Total lbs lost since last in-office visit: 0  Interim History: Ann Woods is journaling daily and her calories average between 1100-1800, and average protein is at least 90 grams daily. She tends to eat only 1100 calories about 3 out of 7 days per week (on work days). She is often skipping a meal at work. She work 6-7 days per week.  Subjective:   1. Vitamin D deficiency Taziah's last Vit D level was low at 30. She is on prescription Vit D every 3 days.  2. Pre-diabetes Giamarie denies polyphagia and she is not on metformin. Lab Results  Component Value Date   HGBA1C 5.8 (H) 10/10/2019    3. At risk for diabetes mellitus Ann Woods is at higher than average risk for developing diabetes due prediabetes and her family history of diabetes mellitus (brothers).   Assessment/Plan:   1. Vitamin D deficiency Low Vitamin D level contributes to fatigue and are associated with obesity, breast, and colon cancer. We will refill prescription Vitamin D for 1 month. Marlin will follow-up for routine testing of Vitamin D, at least 2-3 times per year to avoid over-replacement.  - Vitamin D, Ergocalciferol, (DRISDOL) 1.25 MG (50000 UNIT) CAPS capsule; Take 1 capsule (50,000 Units total) by mouth every 3 (three) days.  Dispense: 10 capsule; Refill: 0  2. Pre-diabetes Ann Woods will continue her meal plan, and will continue to work on weight loss,  exercise, and decreasing simple carbohydrates to help decrease the risk of diabetes.   3. At risk for diabetes mellitus Ann Woods was given approximately 15 minutes of diabetes education and counseling today. We discussed intensive lifestyle modifications today with an emphasis on weight loss as well as increasing exercise and decreasing simple carbohydrates in her diet. We also reviewed medication options with an emphasis on risk versus benefit of those discussed.   Repetitive spaced learning was employed today to elicit superior memory formation and behavioral change.  4. Class 3 severe obesity with serious comorbidity and body mass index (BMI) of 60.0 to 69.9 in adult, unspecified obesity type [facility]) Ann Woods is currently in the action stage of change. As such, her goal is to continue with weight loss efforts. She has agreed to keeping a food journal and adhering to recommended goals of 1800-1900 calories and 90 grams of protein (Vegan if she desires).   Ann Woods is to add a protein bar at work if she does not get a lunch break. She will work on taking a lunch break however because she has the opportunity but often does not take the break.   Exercise goals: No exercise has been prescribed at this time.  Behavioral modification strategies: decreasing eating out.  Ann Woods has agreed to follow-up with our clinic in 3 weeks. She was informed of the importance of frequent follow-up visits to maximize her success with intensive lifestyle modifications for her multiple health conditions.   Objective:   Blood pressure 133/80, pulse 79,  temperature 98 F (36.7 C), temperature source Oral, height 5\' 5"  (1.651 m), weight (!) 385 lb (174.6 kg), SpO2 97 %. Body mass index is 64.07 kg/m.  General: Cooperative, alert, well developed, in no acute distress. HEENT: Conjunctivae and lids unremarkable. Cardiovascular: Regular rhythm.  Lungs: Normal work of breathing. Neurologic: No focal deficits.   Lab  Results  Component Value Date   CREATININE 0.75 08/15/2019   BUN 6 (L) 08/15/2019   NA 141 08/15/2019   K 4.1 08/15/2019   CL 105 08/15/2019   CO2 27 08/15/2019   Lab Results  Component Value Date   ALT 12 08/15/2019   AST 18 08/15/2019   ALKPHOS 96 06/23/2019   BILITOT 0.8 08/15/2019   Lab Results  Component Value Date   HGBA1C 5.8 (H) 10/10/2019   HGBA1C 6.0 (H) 06/23/2019   HGBA1C 5.8 (H) 02/03/2019   HGBA1C 5.7 (H) 08/31/2018   HGBA1C 5.7 (H) 03/02/2018   Lab Results  Component Value Date   INSULIN 20.8 10/10/2019   INSULIN 16.0 06/23/2019   INSULIN 12.7 02/03/2019   INSULIN 10.4 08/31/2018   Lab Results  Component Value Date   TSH 1.520 06/23/2019   Lab Results  Component Value Date   CHOL 154 10/10/2019   HDL 36 (L) 10/10/2019   LDLCALC 96 10/10/2019   TRIG 122 10/10/2019   CHOLHDL 3.3 03/02/2018   Lab Results  Component Value Date   WBC 3.4 08/10/2019   HGB 11.9 08/10/2019   HCT 36.9 08/10/2019   MCV 79 08/10/2019   PLT 239 08/10/2019   Lab Results  Component Value Date   IRON 34 02/03/2019   TIBC 355 02/03/2019   FERRITIN 23 02/03/2019   Attestation Statements:   Reviewed by clinician on day of visit: allergies, medications, problem list, medical history, surgical history, family history, social history, and previous encounter notes.   Wilhemena Durie, am acting as Location manager for [employer], FNP-C.  I have reviewed the above documentation for accuracy and completeness, and I agree with the above. -  Georgianne Fick, FNP

## 2020-01-20 ENCOUNTER — Other Ambulatory Visit: Payer: Managed Care, Other (non HMO) | Admitting: Orthotics

## 2020-01-20 ENCOUNTER — Other Ambulatory Visit: Payer: Self-pay

## 2020-01-30 ENCOUNTER — Ambulatory Visit (INDEPENDENT_AMBULATORY_CARE_PROVIDER_SITE_OTHER): Payer: Managed Care, Other (non HMO) | Admitting: Family Medicine

## 2020-01-30 ENCOUNTER — Other Ambulatory Visit: Payer: Self-pay

## 2020-01-30 ENCOUNTER — Encounter (INDEPENDENT_AMBULATORY_CARE_PROVIDER_SITE_OTHER): Payer: Self-pay | Admitting: Family Medicine

## 2020-01-30 VITALS — BP 132/86 | HR 77 | Temp 98.8°F | Ht 65.0 in | Wt 377.0 lb

## 2020-01-30 DIAGNOSIS — Z9189 Other specified personal risk factors, not elsewhere classified: Secondary | ICD-10-CM

## 2020-01-30 DIAGNOSIS — R7303 Prediabetes: Secondary | ICD-10-CM

## 2020-01-30 DIAGNOSIS — Z9989 Dependence on other enabling machines and devices: Secondary | ICD-10-CM

## 2020-01-30 DIAGNOSIS — E559 Vitamin D deficiency, unspecified: Secondary | ICD-10-CM | POA: Diagnosis not present

## 2020-01-30 DIAGNOSIS — G4733 Obstructive sleep apnea (adult) (pediatric): Secondary | ICD-10-CM | POA: Insufficient documentation

## 2020-01-30 DIAGNOSIS — Z6841 Body Mass Index (BMI) 40.0 and over, adult: Secondary | ICD-10-CM

## 2020-01-30 NOTE — Progress Notes (Signed)
Chief Complaint:   OBESITY Ann Woods is here to discuss her progress with her obesity treatment plan along with follow-up of her obesity related diagnoses. Ann Woods is on keeping a food journal and adhering to recommended goals of 1800-1900 calories and 90 grams of protein daily and states she is following her eating plan approximately 50% of the time. Ann Woods states she is doing 0 minutes 0 times per week.  Today's visit was #: 74 Starting weight: 403 lbs Starting date: 08/31/2018 Today's weight: 377 lbs Today's date: 01/30/2020 Total lbs lost to date: 26 Total lbs lost since last in-office visit: 8  Interim History: Ann Woods is journaling less consistently. She is meeting her protein and calorie goals one the das she does journal. She does use protein shake for a meal once daily. She denies polyphagia. She is still eating vegan. She is also limiting carbohydrates.  Subjective:   1. OSA on CPAP Elantra is struggling to use CPAP. She has had the CPAP for 2 weeks. She has noticed that her headaches are decreased when using CPAP.  2. Pre-diabetes  Last A1c was 5.8. She is not on metformin and she denies polyphagia. She continues to work on diet and exercise to decrease her risk of diabetes. She denies nausea or hypoglycemia.  Lab Results  Component Value Date   HGBA1C 5.8 (H) 10/10/2019   Lab Results  Component Value Date   INSULIN 20.8 10/10/2019   INSULIN 16.0 06/23/2019   INSULIN 12.7 02/03/2019   INSULIN 10.4 08/31/2018   3. Vitamin D deficiency Kana's last Vit D level was low at 30. She is on prescription Vit D every 3 days.  4. At risk for diabetes mellitus Ann Woods is at higher than average risk for developing diabetes due to her obesity.   Assessment/Plan:   1. OSA on CPAP  I encouraged Charlize to continue to try to use her CPAP. We discussed several lifestyle modifications today and she will continue to work on diet, exercise and weight loss efforts. We will  continue to monitor.  2. Pre-diabetes [name] will continue to work on weight loss, exercise, and decreasing simple carbohydrates to help decrease the risk of diabetes. We will check labs today.  - Comprehensive metabolic panel - Hemoglobin A1c - Insulin, random  3. Vitamin D deficiency Low Vitamin D level contributes to fatigue and are associated with obesity, breast, and colon cancer. Ann Woods agreed to continue taking prescription Vitamin D 50,000 IU every 3 days and will follow-up for routine testing of Vitamin D, at least 2-3 times per year to avoid over-replacement. We will check labs today.  - VITAMIN D 25 Hydroxy (Vit-D Deficiency, Fractures)  4. At risk for diabetes mellitus Ann Woods was given approximately 15 minutes of diabetes education and counseling today. We discussed intensive lifestyle modifications today with an emphasis on weight loss as well as increasing exercise and decreasing simple carbohydrates in her diet. We also reviewed medication options with an emphasis on risk versus benefit of those discussed.   Repetitive spaced learning was employed today to elicit superior memory formation and behavioral change.  5. Class 3 severe obesity with serious comorbidity and body mass index (BMI) of 60.0 to 69.9 in adult, unspecified obesity type [facility]) Makhya is currently in the action stage of change. As such, her goal is to continue with weight loss efforts. She has agreed to keeping a food journal and adhering to recommended goals of 1800-1900 calories and 90 grams of protein daily.  Exercise goals: All adults should avoid inactivity. Some physical activity is better than none, and adults who participate in any amount of physical activity gain some health benefits.  Behavioral modification strategies: keeping a strict food journal.  Ann Woods has agreed to follow-up with our clinic in 3 weeks. She was informed of the importance of frequent follow-up visits to maximize her  success with intensive lifestyle modifications for her multiple health conditions.   Ann Woods was informed we would discuss her lab results at her next visit unless there is a critical issue that needs to be addressed sooner. Carter agreed to keep her next visit at the agreed upon time to discuss these results.  Objective:   Blood pressure 132/86, pulse 77, temperature 98.8 F (37.1 C), temperature source Oral, height 5\' 5"  (1.651 m), weight (!) 377 lb (171 kg), SpO2 97 %. Body mass index is 62.74 kg/m.  General: Cooperative, alert, well developed, in no acute distress. HEENT: Conjunctivae and lids unremarkable. Cardiovascular: Regular rhythm.  Lungs: Normal work of breathing. Neurologic: No focal deficits.   Lab Results  Component Value Date   CREATININE 0.75 08/15/2019   BUN 6 (L) 08/15/2019   NA 141 08/15/2019   K 4.1 08/15/2019   CL 105 08/15/2019   CO2 27 08/15/2019   Lab Results  Component Value Date   ALT 12 08/15/2019   AST 18 08/15/2019   ALKPHOS 96 06/23/2019   BILITOT 0.8 08/15/2019   Lab Results  Component Value Date   HGBA1C 5.8 (H) 10/10/2019   HGBA1C 6.0 (H) 06/23/2019   HGBA1C 5.8 (H) 02/03/2019   HGBA1C 5.7 (H) 08/31/2018   HGBA1C 5.7 (H) 03/02/2018   Lab Results  Component Value Date   INSULIN 20.8 10/10/2019   INSULIN 16.0 06/23/2019   INSULIN 12.7 02/03/2019   INSULIN 10.4 08/31/2018   Lab Results  Component Value Date   TSH 1.520 06/23/2019   Lab Results  Component Value Date   CHOL 154 10/10/2019   HDL 36 (L) 10/10/2019   LDLCALC 96 10/10/2019   TRIG 122 10/10/2019   CHOLHDL 3.3 03/02/2018   Lab Results  Component Value Date   WBC 3.4 08/10/2019   HGB 11.9 08/10/2019   HCT 36.9 08/10/2019   MCV 79 08/10/2019   PLT 239 08/10/2019   Lab Results  Component Value Date   IRON 34 02/03/2019   TIBC 355 02/03/2019   FERRITIN 23 02/03/2019   Attestation Statements:   Reviewed by clinician on day of visit: allergies, medications,  problem list, medical history, surgical history, family history, social history, and previous encounter notes.   Wilhemena Durie, am acting as Location manager for [employer], FNP-C.  I have reviewed the above documentation for accuracy and completeness, and I agree with the above. -  Georgianne Fick, FNP

## 2020-01-31 LAB — COMPREHENSIVE METABOLIC PANEL
ALT: 12 IU/L (ref 0–32)
AST: 17 IU/L (ref 0–40)
Albumin/Globulin Ratio: 1.3 (ref 1.2–2.2)
Albumin: 3.9 g/dL (ref 3.8–4.8)
Alkaline Phosphatase: 97 IU/L (ref 48–121)
BUN/Creatinine Ratio: 18 (ref 9–23)
BUN: 17 mg/dL (ref 6–24)
Bilirubin Total: 0.5 mg/dL (ref 0.0–1.2)
CO2: 25 mmol/L (ref 20–29)
Calcium: 9.1 mg/dL (ref 8.7–10.2)
Chloride: 105 mmol/L (ref 96–106)
Creatinine, Ser: 0.94 mg/dL (ref 0.57–1.00)
GFR calc Af Amer: 86 mL/min/{1.73_m2} (ref >59)
GFR calc non Af Amer: 75 mL/min/{1.73_m2} (ref >59)
Globulin, Total: 3 g/dL (ref 1.5–4.5)
Glucose: 89 mg/dL (ref 65–99)
Potassium: 4.7 mmol/L (ref 3.5–5.2)
Sodium: 144 mmol/L (ref 134–144)
Total Protein: 6.9 g/dL (ref 6.0–8.5)

## 2020-01-31 LAB — HEMOGLOBIN A1C
Est. average glucose Bld gHb Est-mCnc: 111 mg/dL
Hgb A1c MFr Bld: 5.5 % (ref 4.8–5.6)

## 2020-01-31 LAB — VITAMIN D 25 HYDROXY (VIT D DEFICIENCY, FRACTURES): Vit D, 25-Hydroxy: 23.8 ng/mL — ABNORMAL LOW (ref 30.0–100.0)

## 2020-01-31 LAB — INSULIN, RANDOM: INSULIN: 13.2 u[IU]/mL (ref 2.6–24.9)

## 2020-02-20 ENCOUNTER — Other Ambulatory Visit: Payer: Self-pay

## 2020-02-20 ENCOUNTER — Ambulatory Visit (INDEPENDENT_AMBULATORY_CARE_PROVIDER_SITE_OTHER): Payer: Managed Care, Other (non HMO) | Admitting: Neurology

## 2020-02-20 ENCOUNTER — Ambulatory Visit
Admission: RE | Admit: 2020-02-20 | Discharge: 2020-02-20 | Disposition: A | Discharge status: Home / Self Care | Payer: Managed Care, Other (non HMO) | Source: Ambulatory Visit | Attending: Internal Medicine | Admitting: Internal Medicine

## 2020-02-20 ENCOUNTER — Ambulatory Visit: Payer: Self-pay | Admitting: Neurology

## 2020-02-20 ENCOUNTER — Ambulatory Visit (INDEPENDENT_AMBULATORY_CARE_PROVIDER_SITE_OTHER): Payer: Managed Care, Other (non HMO) | Admitting: Family Medicine

## 2020-02-20 ENCOUNTER — Encounter (INDEPENDENT_AMBULATORY_CARE_PROVIDER_SITE_OTHER): Payer: Self-pay | Admitting: Family Medicine

## 2020-02-20 ENCOUNTER — Encounter: Payer: Self-pay | Admitting: Neurology

## 2020-02-20 VITALS — BP 118/79 | HR 73 | Temp 98.0°F | Ht 65.0 in | Wt 370.0 lb

## 2020-02-20 VITALS — BP 126/74 | Ht 66.0 in | Wt 373.3 lb

## 2020-02-20 DIAGNOSIS — Z9189 Other specified personal risk factors, not elsewhere classified: Secondary | ICD-10-CM

## 2020-02-20 DIAGNOSIS — G4734 Idiopathic sleep related nonobstructive alveolar hypoventilation: Secondary | ICD-10-CM

## 2020-02-20 DIAGNOSIS — G4733 Obstructive sleep apnea (adult) (pediatric): Secondary | ICD-10-CM

## 2020-02-20 DIAGNOSIS — K760 Fatty (change of) liver, not elsewhere classified: Secondary | ICD-10-CM

## 2020-02-20 DIAGNOSIS — E559 Vitamin D deficiency, unspecified: Secondary | ICD-10-CM | POA: Diagnosis not present

## 2020-02-20 DIAGNOSIS — Z9989 Dependence on other enabling machines and devices: Secondary | ICD-10-CM | POA: Diagnosis not present

## 2020-02-20 DIAGNOSIS — B181 Chronic viral hepatitis B without delta-agent: Secondary | ICD-10-CM

## 2020-02-20 DIAGNOSIS — E8881 Metabolic syndrome: Secondary | ICD-10-CM | POA: Diagnosis not present

## 2020-02-20 DIAGNOSIS — Z6841 Body Mass Index (BMI) 40.0 and over, adult: Secondary | ICD-10-CM

## 2020-02-20 MED ORDER — VITAMIN D (ERGOCALCIFEROL) 1.25 MG (50000 UNIT) PO CAPS: 50000.0000 [IU] | ORAL_CAPSULE | ORAL | 0 refills | Status: DC

## 2020-02-20 NOTE — Progress Notes (Signed)
Subjective:    Patient ID: Ann Woods is a 44 y.o. female.  HPI     Interim history:   Ann Woods is a 44 year old right-handed woman with an underlying medical history of prediabetes, hypertension, hyperlipidemia, hepatitis B, anemia, seasonal allergies, lower extremity swelling and morbid obesity with a BMI of over 60, who Presents for follow-up consultation of his severe obstructive sleep apnea after interim home sleep testing and starting AutoPap therapy. The patient is unaccompanied today. I first met her on 11/10/2019 at the request of her primary care, at which time she reported snoring and excessive daytime somnolence. She was advised to proceed with sleep study testing. She had a home sleep test on 12/12/2019 which indicated severe obstructive sleep apnea with an AHI of 86.6/h, O2 nadir of 58%. She was advised to start AutoPap therapy.   Today, 02/20/2020: I reviewed her AutoPap compliance data from 01/17/2020 through 02/15/2020, which is a total of 30 days, during which time she used her machine only 16 days with percent use days greater than 4 hours at 13% only, indicating low compliance with an average usage of 3 hours and 10 minutes, residual AHI 2/h, 95th percentile pressure of 13 cm, range of 7 to 14 cm with EPR of 3, leak on the higher side with a 95th percentile at 17.9 L/min. Her set up date was 01/03/20. She reports that she is still adjusting to treatment.  Her biggest challenge is to remember to put the machine on.  She has woken up and had pulled the mask off inadvertently as well.  She has noticed already some improvements including less daytime somnolence, less nocturia and less headaches when she uses the machine and in the past few days she has used it more consistently.  She tracks her usage through the app as well.  She is motivated to continue.  She is working on weight loss and is followed by the healthy weight and wellness clinic through [facility].  The patient's  allergies, current medications, family history, past medical history, past social history, past surgical history and problem list were reviewed and updated as appropriate.   Previously:   11/10/19: (She) reports snoring and excessive daytime somnolence.  I reviewed the office note from 10/31/2019.  Her Epworth sleepiness score is 15/24, fatigue severity score is 41/63. She has had occasional AM headaches. She has nocturia about 1/night. Bedtime is around 10, but she has difficulty falling asleep, may be awake till 1 AM. Rise time is 5:45 AM. She lives with her BF and 2 kids, ages 47 and 38. They have 1 dog in the the home. TV in the bedroom tends to stay on at night. She is a NS and does not drink alcohol, no daily caffeine.  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   Her Past Medical History Is Significant For: Past Medical History:  Diagnosis Date  . Anemia   . Hepatitis B   . High cholesterol   . Hypertension    G2 - preeclampsia, CHTN  . Infertility, female   . Lactose intolerance   . Obesity   . Obesity   . Prediabetes 04/01/2018  . Seasonal allergies   . Shortness of breath   . Swelling of both lower extremities   . Urticaria     Her Past Surgical History Is Significant For: Past Surgical History:  Procedure Laterality Date  . CESAREAN SECTION    . HIP SURGERY    . TUBAL  LIGATION Bilateral 05/05/2014   Procedure: POST PARTUM TUBAL LIGATION;  Surgeon: Donnamae Jude, MD;  Location: [facility];  Service: Gynecology;  Laterality: Bilateral;    Her Family History Is Significant For: Family History  Problem Relation Age of Onset  . Heart disease Mother   . Thyroid disease Mother   . High blood pressure Mother   . Obesity Mother   . COPD Father   . Allergic rhinitis Neg Hx   . Angioedema Neg Hx   . Asthma Neg Hx   . Eczema Neg Hx   . Immunodeficiency Neg Hx   . Urticaria Neg Hx     Her Social History Is Significant For: Social History   Socioeconomic History  .  Marital status: Significant Other    Spouse name: Not on file  . Number of children: Not on file  . Years of education: Not on file  . Highest education level: Not on file  Occupational History  . Occupation: Glass blower/designer  Tobacco Use  . Smoking status: Former Smoker    Years: 0.50    Types: Cigarettes    Quit date: 08/25/1993    Years since quitting: 26.5  . Smokeless tobacco: Never Used  . Tobacco comment: one pack of cigarettes weekly- 05/27/18  Vaping Use  . Vaping Use: Never used  Substance and Sexual Activity  . Alcohol use: Not Currently  . Drug use: No  . Sexual activity: Yes    Birth control/protection: None  Other Topics Concern  . Not on file  Social History Narrative  . Not on file   Social Determinants of Health   Financial Resource Strain:   . Difficulty of Paying Living Expenses:   Food Insecurity:   . Worried About Charity fundraiser in the Last Year:   . Arboriculturist in the Last Year:   Transportation Needs:   . Film/video editor (Medical):   Ann Kitchen Lack of Transportation (Non-Medical):   Physical Activity:   . Days of Exercise per Week:   . Minutes of Exercise per Session:   Stress:   . Feeling of Stress :   Social Connections:   . Frequency of Communication with Friends and Family:   . Frequency of Social Gatherings with Friends and Family:   . Attends Religious Services:   . Active Member of Clubs or Organizations:   . Attends Archivist Meetings:   Ann Kitchen Marital Status:     Her Allergies Are:  No Known Allergies:   Her Current Medications Are:  Outpatient Encounter Medications as of 02/20/2020  Medication Sig  . albuterol (VENTOLIN HFA) 108 (90 Base) MCG/ACT inhaler Inhale 2 puffs into the lungs every 6 (six) hours as needed for wheezing or shortness of breath.  Ann Kitchen amLODipine-benazepril (LOTREL) 5-10 MG capsule Take 1 capsule by mouth daily.  Ann Kitchen azelastine (ASTELIN) 0.1 % nasal spray Use 1 spray in each nostril twice daily  .  azelastine (OPTIVAR) 0.05 % ophthalmic solution Use 1 drop in each eye twice daily as needed  . cetirizine (ZYRTEC ALLERGY) 10 MG tablet Take 1 tablet (10 mg total) by mouth 2 (two) times daily.  . clotrimazole (LOTRIMIN) 1 % cream Apply 1 application topically 2 (two) times daily.  . famotidine (PEPCID) 20 MG tablet Take 1 tablet (20 mg total) by mouth 2 (two) times daily.  . ferrous sulfate 325 (65 FE) MG tablet Take 1 tablet (325 mg total) by mouth daily with breakfast.  .  levocetirizine (XYZAL) 5 MG tablet Take 5 mg by mouth every evening.  . methocarbamol (ROBAXIN) 500 MG tablet Take 1 tablet (500 mg total) by mouth 4 (four) times daily.  . montelukast (SINGULAIR) 10 MG tablet Take 1 tablet (10 mg total) by mouth at bedtime.  . Olopatadine HCl (PAZEO) 0.7 % SOLN Place 1 drop into both eyes daily.  Ann Kitchen terbinafine (LAMISIL) 250 MG tablet Take 1 tablet (250 mg total) by mouth daily.  . [name] (CYANOCOBALAMIN) 500 MCG tablet Take 500 mcg by mouth daily.  . Vitamin D, Ergocalciferol, (DRISDOL) 1.25 MG (50000 UNIT) CAPS capsule Take 1 capsule (50,000 Units total) by mouth every 3 (three) days.  Ann Kitchen zinc gluconate 50 MG tablet Take 50 mg by mouth daily.   No facility-administered encounter medications on file as of 02/20/2020.  :  Review of Systems:  Out of a complete 14 point review of systems, all are reviewed and negative with the exception of these symptoms as listed below:  Review of Systems  Neurological:       Here for f/u on autopap. Reports she is still adjusting to the machine.     Objective:  Neurological Exam  Physical Exam Physical Examination:   Vitals:   02/20/20 1303  BP: 126/74    General Examination: The patient is a very pleasant 44 y.o. female in no acute distress. She appears well-developed and well-nourished and well groomed.   HEENT: Normocephalic, atraumatic, pupils are equal, round and reactive to light, tracking is good without limitation to gaze  excursion or nystagmus noted. Corrective eye glasses in place. Hearing is grossly intact. Face is symmetric with normal facial animation. Speech is clear with no dysarthria noted. There is no hypophonia. There is no lip, neck/head, jaw or voice tremor. Neck is supple with full range of passive and active motion. There are no carotid bruits on auscultation. Oropharynx exam reveals: mild mouth dryness, adequate dental hygiene and moderate airway crowding. Tongue protrudes centrally and palate elevates symmetrically.   Chest: Clear to auscultation without wheezing, rhonchi or crackles noted.  Heart: S1+S2+0, regular and normal without murmurs, rubs or gallops noted.   Abdomen: Soft, non-tender and non-distended with normal bowel sounds appreciated on auscultation.  Extremities: There is non-pitting puffiness in the feet.   Skin: Warm and dry without trophic changes noted.   Musculoskeletal: exam reveals no obvious joint deformities, tenderness or joint swelling or erythema.   Neurologically:  Mental status: The patient is awake, alert and oriented in all 4 spheres. Her immediate and remote memory, attention, language skills and fund of knowledge are appropriate. There is no evidence of aphasia, agnosia, apraxia or anomia. Speech is clear with normal prosody and enunciation. Thought process is linear. Mood is normal and affect is normal.  Cranial nerves II - XII are as described above under HEENT exam.  Motor exam: Normal bulk, strength and tone is noted. There is no tremor. Fine motor skills and coordination: grossly intact.  Cerebellar testing: No dysmetria or intention tremor. There is no truncal or gait ataxia.  Sensory exam: intact to light touch in the upper and lower extremities.  Gait, station and balance: She stands easily. No veering to one side is noted. No leaning to one side is noted. Posture is age-appropriate and stance is narrow based. Gait shows normal stride length and normal  pace. No problems turning are noted.   Assessment and Plan:   In summary, Ann Woods is a very pleasant 44 year old  female with an underlying medical history of prediabetes, hypertension, hyperlipidemia, hepatitis B, anemia, seasonal allergies, lower extremity swelling and morbid obesity with a BMI of over 60, who presents for follow-up consultation of her obstructive sleep apnea, which was deemed to be in the severe range by home sleep testing, study date was 12/12/2019.  She has been on AutoPap therapy since 01/03/2020.  She is still adjusting to treatment but has done a little bit better in the past week.  She is commended for her treatment adherence and she has already benefited from it and that her daytime somnolence is less, nocturia is less disruptive to her night and she has had less morning headaches.  She is motivated to continue with treatment and is commended for her weight loss endeavors and her compliance efforts.  She is advised about the insurance requirement for fulfilling compliance criteria in the first 90 days, she is well on her way to improved compliance.  Her sleep apnea is not just severe by a number of events but also by desaturation criteria.  Her nadir was 58%.  She is advised to proceed with an overnight pulse oximetry test at this time, while she is using her AutoPap.  Her DME company will set this up for her, I placed an order for this.  We will call her with her results.  She is advised to follow-up in sleep clinic to see one of our nurse practitioners in 3 months, sooner if needed and hopefully once she is more consistent with her usage she can follow-up yearly.  We will call her with her pulse oximetry test results in the interim.  I answered all her questions today and she was in agreement.   I spent 30 minutes in total face-to-face time and in reviewing records during pre-charting, more than 50% of which was spent in counseling and coordination of care, reviewing test  results, reviewing medications and treatment regimen and/or in discussing or reviewing the diagnosis of OSA, desaturations during sleep, the prognosis and treatment options. Pertinent laboratory and imaging test results that were available during this visit with the patient were reviewed by me and considered in my medical decision making (see chart for details).

## 2020-02-20 NOTE — Patient Instructions (Addendum)
Please continue using your autoPAP regularly. While your insurance requires that you use PAP at least 4 hours each night on 70% of the nights, I recommend, that you not skip any nights and use it throughout the night if you can. Getting used to PAP and staying with the treatment long term does take time and patience and discipline. Untreated obstructive sleep apnea when it is moderate to severe can have an adverse impact on cardiovascular health and raise her risk for heart disease, arrhythmias, hypertension, congestive heart failure, stroke and diabetes. Untreated obstructive sleep apnea causes sleep disruption, nonrestorative sleep, and sleep deprivation. This can have an impact on your day to day functioning and cause daytime sleepiness and impairment of cognitive function, memory loss, mood disturbance, and problems focussing. Using PAP regularly can improve these symptoms.  Your last few days look quite good with consistent usage of your AutoPap, keep up the good work.  You are not quite there yet with your compliance as explained.  Please follow-up in 3 months to see one of our nurse practitioners.  As discussed, we will do an overnight oxygen level test, called ONO, and your DME company will call and set this up for one night, while you also use your autoPAP as usual. We will call you with the results. This is to make sure that your oxygen levels are improved and ideally stay in the 90s, while you are treated with autoPAP for your OSA. Remember, your oxygen levels dropped into the 60s during the home sleep test, lowest O2 saturation was 58%.

## 2020-02-21 NOTE — Progress Notes (Signed)
Chief Complaint:   OBESITY Ann Woods is here to discuss her progress with her obesity treatment plan along with follow-up of her obesity related diagnoses. Ann Woods is on keeping a food journal and adhering to recommended goals of 1800-1900 calories and 90 grams of protein daily and states she is following her eating plan approximately 100% of the time. Ann Woods states she is doing 0 minutes 0 times per week.  Today's visit was #: 28 Starting weight: 403 lbs Starting date: 08/31/2018 Today's weight: 370 lbs Today's date: 02/20/2020 Total lbs lost to date: 33 Total lbs lost since last in-office visit: 7  Interim History: Ann Woods is journaling daily and meeting calorie and protein goals. She has lost a total of 33 lbs.  She is still vegan. Her calories average at 1500 Monday-Friday and 1800 calories on the weekend. She has struggled to get in enough calories int he past. She is limiting her carbohydrates. She would like to start walking for exercise.  Subjective:   1. Fatty liver Ann Woods's liver enzymes are normal, but have been elevated in the past. Lab Results  Component Value Date   ALT 12 01/30/2020   AST 17 01/30/2020   ALKPHOS 97 01/30/2020   BILITOT 0.5 01/30/2020    2. Vitamin D deficiency Ann Woods's Vit D level has decreased from 30 to 23.8 despite adherence to Rx vit D. She denies fatigue.  3. Insulin resistance Ann Woods has a diagnosis of insulin resistance based on her elevated fasting insulin level >5. Last A1c decreased from 5.8 to 5.5. She is not on metformin. She continues to work on diet and exercise to decrease her risk of diabetes.  Lab Results  Component Value Date   INSULIN 13.2 01/30/2020   INSULIN 20.8 10/10/2019   INSULIN 16.0 06/23/2019   INSULIN 12.7 02/03/2019   INSULIN 10.4 08/31/2018   Lab Results  Component Value Date   HGBA1C 5.5 01/30/2020   4. Obstructive sleep apnea Ann Woods notes better adherence to CPAP. She is using it 5 out of 7 nights  for 4 hours.  5. At risk for heart disease Ann Woods is at a higher than average risk for cardiovascular disease due to obesity, OSA, and hypertension.   Assessment/Plan:   1. Fatty liver We discussed the likely diagnosis of non-alcoholic fatty liver disease today and how this condition is obesity related. Ann Woods was educated the importance of weight loss. Ann Woods agreed to continue with her meal plan, and weight loss efforts with healthier diet and exercise as an essential part of her treatment plan.  2. Vitamin D deficiency Low Vitamin D level contributes to fatigue and are associated with obesity, breast, and colon cancer. Ann Woods agreed to increase prescription Vitamin D to 50,000 IU every days with no refills. She will follow-up for routine testing of Vitamin D, at least 2-3 times per year to avoid over-replacement.  - Vitamin D, Ergocalciferol, (DRISDOL) 1.25 MG (50000 UNIT) CAPS capsule; Take 1 capsule (50,000 Units total) by mouth every 3 (three) days.  Dispense: 10 capsule; Refill: 0  3. Insulin resistance Ann Woods will continue her meal plan, and will continue to work on weight loss, exercise, and decreasing simple carbohydrates to help decrease the risk of diabetes.  Ann Woods agreed to follow-up with [place] as directed to closely monitor her progress.  4. Obstructive sleep apnea Intensive lifestyle modifications are the first line treatment for this issue. We discussed several lifestyle modifications today. Ann Woods will continue to increase her use of CPAP. She will continue  to work on diet, exercise and weight loss efforts. We will continue to monitor.   5. At risk for heart disease Ann Woods was given approximately 15 minutes of coronary artery disease prevention counseling today. She is 44 y.o. female and has risk factors for heart disease including obesity. We discussed intensive lifestyle modifications today with an emphasis on specific weight loss instructions and strategies.    Repetitive spaced learning was employed today to elicit superior memory formation and behavioral change.  6. Class 3 severe obesity with serious comorbidity and body mass index (BMI) of 60.0 to 69.9 in adult, unspecified obesity type [facility]) Ann Woods is currently in the action stage of change. As such, her goal is to continue with weight loss efforts. She has agreed to keeping a food journal and adhering to recommended goals of 1800-1900 calories and 90 grams of protein daily.   Exercise goals: Ann Woods is to start walking 20 minutes 3 times per week.  Behavioral modification strategies: increasing lean protein intake and planning for success.  Ann Woods has agreed to follow-up with our clinic in 3 weeks. She was informed of the importance of frequent follow-up visits to maximize her success with intensive lifestyle modifications for her multiple health conditions.   Objective:   Blood pressure 118/79, pulse 73, temperature 98 F (36.7 C), temperature source Oral, height 5\' 5"  (1.651 m), weight (!) 370 lb (167.8 kg), SpO2 99 %. Body mass index is 61.57 kg/m.  General: Cooperative, alert, well developed, in no acute distress. HEENT: Conjunctivae and lids unremarkable. Cardiovascular: Regular rhythm.  Lungs: Normal work of breathing. Neurologic: No focal deficits.   Lab Results  Component Value Date   CREATININE 0.94 01/30/2020   BUN 17 01/30/2020   NA 144 01/30/2020   K 4.7 01/30/2020   CL 105 01/30/2020   CO2 25 01/30/2020   Lab Results  Component Value Date   ALT 12 01/30/2020   AST 17 01/30/2020   ALKPHOS 97 01/30/2020   BILITOT 0.5 01/30/2020   Lab Results  Component Value Date   HGBA1C 5.5 01/30/2020   HGBA1C 5.8 (H) 10/10/2019   HGBA1C 6.0 (H) 06/23/2019   HGBA1C 5.8 (H) 02/03/2019   HGBA1C 5.7 (H) 08/31/2018   Lab Results  Component Value Date   INSULIN 13.2 01/30/2020   INSULIN 20.8 10/10/2019   INSULIN 16.0 06/23/2019   INSULIN 12.7 02/03/2019   INSULIN  10.4 08/31/2018   Lab Results  Component Value Date   TSH 1.520 06/23/2019   Lab Results  Component Value Date   CHOL 154 10/10/2019   HDL 36 (L) 10/10/2019   LDLCALC 96 10/10/2019   TRIG 122 10/10/2019   CHOLHDL 3.3 03/02/2018   Lab Results  Component Value Date   WBC 3.4 08/10/2019   HGB 11.9 08/10/2019   HCT 36.9 08/10/2019   MCV 79 08/10/2019   PLT 239 08/10/2019   Lab Results  Component Value Date   IRON 34 02/03/2019   TIBC 355 02/03/2019   FERRITIN 23 02/03/2019   Attestation Statements:   Reviewed by clinician on day of visit: allergies, medications, problem list, medical history, surgical history, family history, social history, and previous encounter notes.   Wilhemena Durie, am acting as Location manager for [employer], FNP-C.  I have reviewed the above documentation for accuracy and completeness, and I agree with the above. -  Georgianne Fick, FNP

## 2020-02-22 ENCOUNTER — Encounter (INDEPENDENT_AMBULATORY_CARE_PROVIDER_SITE_OTHER): Payer: Self-pay | Admitting: Family Medicine

## 2020-02-22 DIAGNOSIS — E8881 Metabolic syndrome: Secondary | ICD-10-CM | POA: Insufficient documentation

## 2020-02-22 DIAGNOSIS — E88819 Insulin resistance, unspecified: Secondary | ICD-10-CM | POA: Insufficient documentation

## 2020-02-23 ENCOUNTER — Encounter: Payer: Self-pay | Admitting: Neurology

## 2020-02-28 ENCOUNTER — Other Ambulatory Visit: Payer: Self-pay | Admitting: Family Medicine

## 2020-02-28 DIAGNOSIS — D249 Benign neoplasm of unspecified breast: Secondary | ICD-10-CM

## 2020-03-06 ENCOUNTER — Telehealth: Payer: Self-pay | Admitting: Neurology

## 2020-03-06 NOTE — Telephone Encounter (Signed)
I received patient's overnight pulse oximetry test results from 02/23/2020.  Patient was on her AutoPap, total duration of test was 6 hours and 39 minutes, average oxygen saturation 95%, nadir was 74%.  Time below are at 88% saturation was 4 minutes and 40 seconds.  Please call patient and advise her that we need to continue to work on full compliance with her AutoPap and continue to work on weight loss.  As it stands, she has improved oxygen saturations, they are not perfect yet but not enough to justify supplementing with oxygen at night.  I would like to continue to monitor her clinically and would like for her to continue to work on full compliance with her AutoPap and weight loss and follow-up as scheduled with Amy in September.

## 2020-03-06 NOTE — Telephone Encounter (Signed)
I called pt. No answer, left a message asking pt to call me back.   

## 2020-03-07 NOTE — Telephone Encounter (Signed)
I called pt. No answer, left a message asking pt to call me back.   

## 2020-03-07 NOTE — Telephone Encounter (Signed)
Patient called back and she relayed if you can call her back after 10:00 am please . Thanks Ann Woods

## 2020-03-08 NOTE — Telephone Encounter (Signed)
Pt returned my call and we reviewed ONO testing results.  Pt verbalized understanding. She will continue to work on autopap compliance along with weight loss efforts.

## 2020-03-12 ENCOUNTER — Other Ambulatory Visit: Payer: Self-pay

## 2020-03-12 ENCOUNTER — Other Ambulatory Visit: Payer: Managed Care, Other (non HMO)

## 2020-03-12 DIAGNOSIS — B181 Chronic viral hepatitis B without delta-agent: Secondary | ICD-10-CM

## 2020-03-14 LAB — COMPLETE METABOLIC PANEL WITH GFR
AG Ratio: 1.3 (calc) (ref 1.0–2.5)
ALT: 10 U/L (ref 6–29)
AST: 16 U/L (ref 10–30)
Albumin: 3.8 g/dL (ref 3.6–5.1)
Alkaline phosphatase (APISO): 78 U/L (ref 31–125)
BUN: 7 mg/dL (ref 7–25)
CO2: 31 mmol/L (ref 20–32)
Calcium: 9.1 mg/dL (ref 8.6–10.2)
Chloride: 108 mmol/L (ref 98–110)
Creat: 0.65 mg/dL (ref 0.50–1.10)
GFR, Est African American: 126 mL/min/{1.73_m2} (ref >=60)
GFR, Est Non African American: 109 mL/min/{1.73_m2} (ref >=60)
Globulin: 2.9 g/dL (calc) (ref 1.9–3.7)
Glucose, Bld: 99 mg/dL (ref 65–99)
Potassium: 4.2 mmol/L (ref 3.5–5.3)
Sodium: 142 mmol/L (ref 135–146)
Total Bilirubin: 0.8 mg/dL (ref 0.2–1.2)
Total Protein: 6.7 g/dL (ref 6.1–8.1)

## 2020-03-14 LAB — HEPATITIS B DNA, ULTRAQUANTITATIVE, PCR
Hepatitis B DNA (Calc): 3.46 Log IU/mL — ABNORMAL HIGH
Hepatitis B DNA: 2880 IU/mL — ABNORMAL HIGH

## 2020-03-19 ENCOUNTER — Encounter (INDEPENDENT_AMBULATORY_CARE_PROVIDER_SITE_OTHER): Payer: Self-pay | Admitting: Family Medicine

## 2020-03-19 ENCOUNTER — Other Ambulatory Visit: Payer: Self-pay

## 2020-03-19 ENCOUNTER — Ambulatory Visit (INDEPENDENT_AMBULATORY_CARE_PROVIDER_SITE_OTHER): Payer: Managed Care, Other (non HMO) | Admitting: Family Medicine

## 2020-03-19 VITALS — BP 121/79 | HR 78 | Temp 97.8°F | Ht 65.0 in | Wt 374.0 lb

## 2020-03-19 DIAGNOSIS — Z6841 Body Mass Index (BMI) 40.0 and over, adult: Secondary | ICD-10-CM | POA: Diagnosis not present

## 2020-03-19 DIAGNOSIS — E8881 Metabolic syndrome: Secondary | ICD-10-CM | POA: Diagnosis not present

## 2020-03-19 NOTE — Progress Notes (Signed)
Chief Complaint:   OBESITY Ann Woods is here to discuss her progress with her obesity treatment plan along with follow-up of her obesity related diagnoses. Ann Woods is on keeping a food journal and adhering to recommended goals of 1800-1900 calories and 90 grams of protein daily and states she is following her eating plan approximately 100% of the time. Ann Woods states she is doing 0 minutes 0 times per week.  Today's visit was #: 32 Starting weight: 403 lbs Starting date: 08/31/2018 Today's weight: 374 lbs Today's date: 03/19/2020 Total lbs lost to date: 29 Total lbs lost since last in-office visit: 0  Interim History: Lorna notes going over on calories and eating more bread over the last few weeks. She has been journaling regularly but not measuring portions. She notes snacking on carbohydrates such as Naan chips. She is meeting her protein goals and still eating Vegan. She is eating snack portions that are oversize.  Subjective:   1. Insulin resistance Ann Woods has a diagnosis of insulin resistance based on her elevated fasting insulin level >5. She does not feel that her hunger is an issue for her. She denies cravings. Last A1c was 5.5. She continues to work on diet and exercise to decrease her risk of diabetes.  Lab Results  Component Value Date   INSULIN 13.2 01/30/2020   INSULIN 20.8 10/10/2019   INSULIN 16.0 06/23/2019   INSULIN 12.7 02/03/2019   INSULIN 10.4 08/31/2018   Lab Results  Component Value Date   HGBA1C 5.5 01/30/2020   Assessment/Plan:   1. Insulin resistance Ann Woods will continue to work on weight loss, exercise, and decreasing simple carbohydrates to help decrease the risk of diabetes. Ann Woods declines metformin, and she agreed to follow-up with [place] as directed to closely monitor her progress.  2. Class 3 severe obesity with serious comorbidity and body mass index (BMI) of 60.0 to 69.9 in adult, unspecified obesity type [facility]) Ann Woods is currently in the  action stage of change. As such, her goal is to continue with weight loss efforts. She has agreed to keeping a food journal and adhering to recommended goals of 1800-1900 calories and 90 grams of protein protein daily.   Exercise goals: No exercise has been prescribed at this time.  Behavioral modification strategies: decreasing simple carbohydrates, better snacking choices and keeping a strict food journal.  Ann Woods has agreed to follow-up with our clinic in 4 weeks. She was informed of the importance of frequent follow-up visits to maximize her success with intensive lifestyle modifications for her multiple health conditions.   Objective:   Blood pressure 121/79, pulse 78, temperature 97.8 F (36.6 C), temperature source Oral, height 5\' 5"  (1.651 m), weight (!) 374 lb (169.6 kg), SpO2 98 %. Body mass index is 62.24 kg/m.  General: Cooperative, alert, well developed, in no acute distress. HEENT: Conjunctivae and lids unremarkable. Cardiovascular: Regular rhythm.  Lungs: Normal work of breathing. Neurologic: No focal deficits.   Lab Results  Component Value Date   CREATININE 0.65 03/12/2020   BUN 7 03/12/2020   NA 142 03/12/2020   K 4.2 03/12/2020   CL 108 03/12/2020   CO2 31 03/12/2020   Lab Results  Component Value Date   ALT 10 03/12/2020   AST 16 03/12/2020   GGT 28 06/17/2018   ALKPHOS 97 01/30/2020   BILITOT 0.8 03/12/2020   Lab Results  Component Value Date   HGBA1C 5.5 01/30/2020   HGBA1C 5.8 (H) 10/10/2019   HGBA1C 6.0 (H) 06/23/2019  HGBA1C 5.8 (H) 02/03/2019   HGBA1C 5.7 (H) 08/31/2018   Lab Results  Component Value Date   INSULIN 13.2 01/30/2020   INSULIN 20.8 10/10/2019   INSULIN 16.0 06/23/2019   INSULIN 12.7 02/03/2019   INSULIN 10.4 08/31/2018   Lab Results  Component Value Date   TSH 1.520 06/23/2019   Lab Results  Component Value Date   CHOL 154 10/10/2019   HDL 36 (L) 10/10/2019   LDLCALC 96 10/10/2019   TRIG 122 10/10/2019   CHOLHDL  3.3 03/02/2018   Lab Results  Component Value Date   WBC 3.4 08/10/2019   HGB 11.9 08/10/2019   HCT 36.9 08/10/2019   MCV 79 08/10/2019   PLT 239 08/10/2019   Lab Results  Component Value Date   IRON 34 02/03/2019   TIBC 355 02/03/2019   FERRITIN 23 02/03/2019   Attestation Statements:   Reviewed by clinician on day of visit: allergies, medications, problem list, medical history, surgical history, family history, social history, and previous encounter notes.   Wilhemena Durie, am acting as Location manager for [employer], FNP-C.  I have reviewed the above documentation for accuracy and completeness, and I agree with the above. -  Georgianne Fick, FNP

## 2020-03-27 ENCOUNTER — Ambulatory Visit (INDEPENDENT_AMBULATORY_CARE_PROVIDER_SITE_OTHER): Payer: Managed Care, Other (non HMO) | Admitting: Internal Medicine

## 2020-03-27 ENCOUNTER — Other Ambulatory Visit: Payer: Self-pay

## 2020-03-27 ENCOUNTER — Encounter: Payer: Self-pay | Admitting: Internal Medicine

## 2020-03-27 VITALS — BP 157/100 | HR 80 | Temp 97.9°F | Wt 382.0 lb

## 2020-03-27 DIAGNOSIS — B181 Chronic viral hepatitis B without delta-agent: Secondary | ICD-10-CM

## 2020-03-27 DIAGNOSIS — K7400 Hepatic fibrosis, unspecified: Secondary | ICD-10-CM | POA: Diagnosis not present

## 2020-03-27 DIAGNOSIS — Z6841 Body Mass Index (BMI) 40.0 and over, adult: Secondary | ICD-10-CM | POA: Diagnosis not present

## 2020-03-27 NOTE — Progress Notes (Signed)
   Subjective:    Patient ID: Ann Woods, female    DOB: [birth year], 44 y.o.   MRN: [id number]  HPI Here for follow-up of hepatitis B. She has positive viral DNA first noted in 2015 and more recently noted to be 6-8,000.  She has never been on treatment.  She has no transaminitis and AST and ALT levels have remained less than 20. 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  Interestingly, her fibro-sure fibrosis score is F0 with a 0 for inflammatory activity.  Her platelets are normal and albumin has remained normal.    She is here for follow up.  HBV DNA level remains stable at 2,880 and normal transaminases.  Ultrasound with no masses, + steatosis and gallstones with no wall thickening.  No complaints otherwise.  She is continuing her weight loss and making good progress with now a 50 lb weight loss overall.     Review of Systems  Constitutional: Negative for unexpected weight change.  Gastrointestinal: Negative for diarrhea and nausea.  Skin: Negative for rash.       Objective:   Physical Exam Constitutional:      Appearance: She is well-developed.  Eyes:     General: No scleral icterus. Pulmonary:     Effort: No respiratory distress.  Skin:    Findings: No rash.  Neurological:     Mental Status: She is alert.  Psychiatric:        Mood and Affect: Mood normal.    SH: occasional alcohol       Assessment & Plan:

## 2020-03-27 NOTE — Assessment & Plan Note (Signed)
I will check a CT next screening visit then ultrasound again after that every 6 months.    rtc 1 year

## 2020-03-27 NOTE — Assessment & Plan Note (Signed)
She continues to do well.  She continues to have no indication for treatment.  She does have some fibrosis related to her fatty liver but no inflammation related to the hepatitis B, therefore will continue to monitor off of treatment.   rtc 1 year

## 2020-03-27 NOTE — Assessment & Plan Note (Signed)
She is continuing to make great progress with her weight loss.

## 2020-04-16 ENCOUNTER — Ambulatory Visit
Admission: RE | Admit: 2020-04-16 | Discharge: 2020-04-16 | Disposition: A | Discharge status: Home / Self Care | Payer: Managed Care, Other (non HMO) | Source: Ambulatory Visit | Attending: Family Medicine | Admitting: Family Medicine

## 2020-04-16 ENCOUNTER — Encounter (INDEPENDENT_AMBULATORY_CARE_PROVIDER_SITE_OTHER): Payer: Self-pay | Admitting: Family Medicine

## 2020-04-16 ENCOUNTER — Other Ambulatory Visit: Payer: Self-pay

## 2020-04-16 ENCOUNTER — Ambulatory Visit (INDEPENDENT_AMBULATORY_CARE_PROVIDER_SITE_OTHER): Payer: Managed Care, Other (non HMO) | Admitting: Family Medicine

## 2020-04-16 VITALS — BP 112/70 | HR 72 | Temp 98.3°F | Ht 65.0 in | Wt 360.0 lb

## 2020-04-16 DIAGNOSIS — E559 Vitamin D deficiency, unspecified: Secondary | ICD-10-CM | POA: Diagnosis not present

## 2020-04-16 DIAGNOSIS — D249 Benign neoplasm of unspecified breast: Secondary | ICD-10-CM

## 2020-04-16 DIAGNOSIS — E8881 Metabolic syndrome: Secondary | ICD-10-CM

## 2020-04-16 DIAGNOSIS — Z9189 Other specified personal risk factors, not elsewhere classified: Secondary | ICD-10-CM

## 2020-04-16 DIAGNOSIS — Z6841 Body Mass Index (BMI) 40.0 and over, adult: Secondary | ICD-10-CM

## 2020-04-16 MED ORDER — VITAMIN D (ERGOCALCIFEROL) 1.25 MG (50000 UNIT) PO CAPS: 50000.0000 [IU] | ORAL_CAPSULE | ORAL | 0 refills | Status: DC

## 2020-04-16 NOTE — Progress Notes (Signed)
Chief Complaint:   OBESITY Ann Woods is here to discuss her progress with her obesity treatment plan along with follow-up of her obesity related diagnoses. Ann Woods is on keeping a food journal and adhering to recommended goals of 1800-1900 calories and 90 grams of protein daily and states she is following her eating plan approximately 100% of the time. Ann Woods states she is doing 0 minutes 0 times per week.  Today's visit was #: 5 Starting weight: 403 lbs Starting date: 08/31/2018 Today's weight: 360 lbs Today's date: 04/16/2020 Total lbs lost to date: 43 Total lbs lost since last in-office visit: 14  Interim History: Ann Woods has been adhering to the plan very closely the past few weeks. She has been meeting calorie goals the past few weeks but she averages only 80 grams of protein per day. She is vegan. She has lost 76 lbs overall since starting her weight loss journey in the Fall of 2019. Her water intake is sufficient.   Subjective:   1. Vitamin D deficiency Ann Woods's last Vit D level was low at 23.8. She is on prescription Vit D 2 times weekly. We increased her dose to 2 times weekly in June from once weekly because Vit D was not increasing on once weekly..  2. Insulin resistance Ann Woods has a diagnosis of insulin resistance based on her elevated fasting insulin level >5. She denies excessive hunger. She continues to work on diet and exercise to decrease her risk of diabetes.  Lab Results  Component Value Date   INSULIN 13.2 01/30/2020   INSULIN 20.8 10/10/2019   INSULIN 16.0 06/23/2019   INSULIN 12.7 02/03/2019   INSULIN 10.4 08/31/2018   Lab Results  Component Value Date   HGBA1C 5.5 01/30/2020   3. At risk for osteoporosis Ann Woods is at higher risk of osteopenia and osteoporosis due to Vitamin D deficiency.   Assessment/Plan:   1. Vitamin D deficiency Low Vitamin D level contributes to fatigue and are associated with obesity, breast, and colon cancer. We will  refill prescription Vitamin D for 1 month. Ann Woods will follow-up for routine testing of Vitamin D, at least 2-3 times per year to avoid over-replacement.  - Vitamin D, Ergocalciferol, (DRISDOL) 1.25 MG (50000 UNIT) CAPS capsule; Take 1 capsule (50,000 Units total) by mouth every 3 (three) days.  Dispense: 10 capsule; Refill: 0  2. Insulin resistance Ann Woods will continue her meal plan, and will continue to work on weight loss, exercise, and decreasing simple carbohydrates to help decrease the risk of diabetes. Ann Woods agreed to follow-up with [place] as directed to closely monitor her progress.  3. At risk for osteoporosis Ann Woods was given approximately 15 minutes of osteoporosis prevention counseling today. Ann Woods is at risk for osteopenia and osteoporosis due to her Vitamin D deficiency. She was encouraged to take her Vitamin D and follow her higher calcium diet and increase strengthening exercise to help strengthen her bones and decrease her risk of osteopenia and osteoporosis.  Repetitive spaced learning was employed today to elicit superior memory formation and behavioral change.  4. Class 3 severe obesity with serious comorbidity and body mass index (BMI) of 60.0 to 69.9 in adult, unspecified obesity type [facility]) Ann Woods is currently in the action stage of change. As such, her goal is to continue with weight loss efforts. She has agreed to keeping a food journal and adhering to recommended goals of 1800-1900 calories and 90 grams of protein daily.   Exercise goals: No exercise has been prescribed at this  time.  Behavioral modification strategies: increasing lean protein intake.  Ann Woods has agreed to follow-up with our clinic in 4 weeks. She was informed of the importance of frequent follow-up visits to maximize her success with intensive lifestyle modifications for her multiple health conditions.   Objective:   Blood pressure 112/70, pulse 72, temperature 98.3 F (36.8 C), temperature  source Oral, height 5\' 5"  (1.651 m), weight (!) 360 lb (163.3 kg), SpO2 99 %. Body mass index is 59.91 kg/m.  General: Cooperative, alert, well developed, in no acute distress. HEENT: Conjunctivae and lids unremarkable. Cardiovascular: Regular rhythm.  Lungs: Normal work of breathing. Neurologic: No focal deficits.   Lab Results  Component Value Date   CREATININE 0.65 03/12/2020   BUN 7 03/12/2020   NA 142 03/12/2020   K 4.2 03/12/2020   CL 108 03/12/2020   CO2 31 03/12/2020   Lab Results  Component Value Date   ALT 10 03/12/2020   AST 16 03/12/2020   GGT 28 06/17/2018   ALKPHOS 97 01/30/2020   BILITOT 0.8 03/12/2020   Lab Results  Component Value Date   HGBA1C 5.5 01/30/2020   HGBA1C 5.8 (H) 10/10/2019   HGBA1C 6.0 (H) 06/23/2019   HGBA1C 5.8 (H) 02/03/2019   HGBA1C 5.7 (H) 08/31/2018   Lab Results  Component Value Date   INSULIN 13.2 01/30/2020   INSULIN 20.8 10/10/2019   INSULIN 16.0 06/23/2019   INSULIN 12.7 02/03/2019   INSULIN 10.4 08/31/2018   Lab Results  Component Value Date   TSH 1.520 06/23/2019   Lab Results  Component Value Date   CHOL 154 10/10/2019   HDL 36 (L) 10/10/2019   LDLCALC 96 10/10/2019   TRIG 122 10/10/2019   CHOLHDL 3.3 03/02/2018   Lab Results  Component Value Date   WBC 3.4 08/10/2019   HGB 11.9 08/10/2019   HCT 36.9 08/10/2019   MCV 79 08/10/2019   PLT 239 08/10/2019   Lab Results  Component Value Date   IRON 34 02/03/2019   TIBC 355 02/03/2019   FERRITIN 23 02/03/2019   Attestation Statements:   Reviewed by clinician on day of visit: allergies, medications, problem list, medical history, surgical history, family history, social history, and previous encounter notes.   Wilhemena Durie, am acting as Location manager for [employer], FNP-C.  I have reviewed the above documentation for accuracy and completeness, and I agree with the above. -  Georgianne Fick, FNP

## 2020-04-17 ENCOUNTER — Encounter (INDEPENDENT_AMBULATORY_CARE_PROVIDER_SITE_OTHER): Payer: Self-pay | Admitting: Family Medicine

## 2020-05-04 ENCOUNTER — Encounter: Payer: Self-pay | Admitting: Family Medicine

## 2020-05-04 ENCOUNTER — Other Ambulatory Visit: Payer: Self-pay

## 2020-05-04 ENCOUNTER — Ambulatory Visit: Payer: Managed Care, Other (non HMO) | Admitting: Family Medicine

## 2020-05-04 VITALS — BP 130/88 | HR 79 | Temp 97.6°F | Wt 369.8 lb

## 2020-05-04 DIAGNOSIS — R350 Frequency of micturition: Secondary | ICD-10-CM | POA: Diagnosis not present

## 2020-05-04 DIAGNOSIS — N3001 Acute cystitis with hematuria: Secondary | ICD-10-CM | POA: Diagnosis not present

## 2020-05-04 LAB — POCT URINALYSIS DIP (PROADVANTAGE DEVICE)
Bilirubin, UA: NEGATIVE
Glucose, UA: NEGATIVE mg/dL
Ketones, POC UA: NEGATIVE mg/dL
Nitrite, UA: NEGATIVE
Protein Ur, POC: 30 mg/dL — AB
Specific Gravity, Urine: 1.03
Urobilinogen, Ur: NEGATIVE
pH, UA: 5.5 (ref 5.0–8.0)

## 2020-05-04 MED ORDER — SULFAMETHOXAZOLE-TRIMETHOPRIM 800-160 MG PO TABS: 1.0000 | ORAL_TABLET | Freq: Two times a day (BID) | ORAL | 0 refills | Status: DC

## 2020-05-04 NOTE — Progress Notes (Signed)
Subjective:  Ann Woods is a 44 y.o. female who complains of possible urinary tract infection.  She has had symptoms for 3 days.  Symptoms include urinary frequency, urgency, dysuria. she also is only urinating small amounts. Patient denies fever, chills, N/V/D.  Last UTI was years ago.   Using cranberry juice and increased water for current symptoms.    Patient does not have a history of recurrent UTI. Patient does not have a history of pyelonephritis.  No other aggravating or relieving factors.  No other c/o.  Stopped having periods in May 2021.  Is not on contraception.  UTD with pap smear. Sees OB/GYN   She has lost weight. Seeing [facility] weight [facility]  Vegan diet. Doing well.    Past Medical History:  Diagnosis Date   Anemia    Hepatitis B    High cholesterol    Hypertension    G2 - preeclampsia, CHTN   Infertility, female    Lactose intolerance    Obesity    Obesity    Prediabetes 04/01/2018   Seasonal allergies    Shortness of breath    Swelling of both lower extremities    Urticaria     ROS as in subjective  Reviewed allergies, medications, past medical, surgical, and social history.    Objective: Vitals:   05/04/20 0921  BP: 130/88  Pulse: 79  Temp: 97.6 F (36.4 C)    General appearance: alert, no distress, WD/WN, female Abdomen: soft, non tender, non distended, no palpable masses Back: no CVA tenderness GU: deferred       Laboratory:  Urine dipstick: trace for leukocyte esterase, +pro, +blood     Assessment: Acute cystitis with hematuria - Plan: sulfamethoxazole-trimethoprim (BACTRIM DS) 800-160 MG tablet, POCT Urinalysis DIP (Proadvantage Device), Urine Culture  Frequent urination - Plan: POCT Urinalysis DIP (Proadvantage Device)    Plan: Discussed symptoms, diagnosis, possible complications, and usual course of illness.  Bactrim prescribed.   Advised increased water intake, can use OTC Tylenol for pain.    Advised she  may take AZO standard for 1-2 days and then stop.   Urine culture sent.   Call or return if worse or not improving.  Follow up for repeat UA in office, lab visit only to ensure resolution of hematuria.

## 2020-05-04 NOTE — Patient Instructions (Signed)
You appear to have a urinary tract infection.  I have prescribed an antibiotic for you to take twice daily for the next 5 days.  You may also take Azo-Standard over-the-counter for the first day or 2 if you would like  Continue drinking plenty of water  Let me know if you are not back to baseline when you complete the antibiotic or if you develop symptoms again in the next few weeks.  I would like to recheck your urine in a couple of weeks to make sure you no longer have blood in it.  You can do this as a lab visit.  Just call and schedule to come in for a repeat urine check

## 2020-05-06 LAB — URINE CULTURE

## 2020-05-14 ENCOUNTER — Encounter (INDEPENDENT_AMBULATORY_CARE_PROVIDER_SITE_OTHER): Payer: Self-pay | Admitting: Family Medicine

## 2020-05-14 ENCOUNTER — Ambulatory Visit (INDEPENDENT_AMBULATORY_CARE_PROVIDER_SITE_OTHER): Payer: Managed Care, Other (non HMO) | Admitting: Family Medicine

## 2020-05-14 VITALS — BP 120/77 | HR 60 | Temp 98.1°F | Ht 65.0 in | Wt 366.0 lb

## 2020-05-14 DIAGNOSIS — E8881 Metabolic syndrome: Secondary | ICD-10-CM

## 2020-05-14 DIAGNOSIS — Z6841 Body Mass Index (BMI) 40.0 and over, adult: Secondary | ICD-10-CM | POA: Diagnosis not present

## 2020-05-14 DIAGNOSIS — E88819 Insulin resistance, unspecified: Secondary | ICD-10-CM

## 2020-05-15 ENCOUNTER — Encounter (INDEPENDENT_AMBULATORY_CARE_PROVIDER_SITE_OTHER): Payer: Self-pay | Admitting: Family Medicine

## 2020-05-15 NOTE — Progress Notes (Signed)
Chief Complaint:   OBESITY Ann Woods is here to discuss her progress with her obesity treatment plan along with follow-up of her obesity related diagnoses. Ann Woods is on keeping a food journal and adhering to recommended goals of 1800-1900 calories and 90 grams of protein daily and states she is following her eating plan approximately 100% of the time. Ann Woods states she is walking for 20 minutes 2 times per week.  Today's visit was #: 68 Starting weight: 403 lbs Starting date: 08/31/2018 Today's weight: 366 lbs Today's date: 05/14/2020 Total lbs lost to date: 37 Total lbs lost since last in-office visit: 0  Interim History: Ann Woods is up 6 lbs today. She is journaling daily and her calories average  1800. Protein is below goal at 50-70 grams per day. She denies exceeding her calories. She does supplement with a protein shake once daily. She is Vegan. She notes excessive work hours impede exercise.  Subjective:   1. Insulin resistance Ann Woods has a diagnosis of insulin resistance based on her elevated fasting insulin level >5. She is not on metformin, and she denies polyphagia.   Lab Results  Component Value Date   INSULIN 13.2 01/30/2020   INSULIN 20.8 10/10/2019   INSULIN 16.0 06/23/2019   INSULIN 12.7 02/03/2019   INSULIN 10.4 08/31/2018   Lab Results  Component Value Date   HGBA1C 5.5 01/30/2020   Assessment/Plan:   1. Insulin resistance Ann Woods will continue her meal plan..  2. Class 3 severe obesity with serious comorbidity and body mass index (BMI) of 60.0 to 69.9 in adult, unspecified obesity type Ann Woods) Ann Woods is currently in the action stage of change. As such, her goal is to continue with weight loss efforts. She has agreed to keeping a food journal and adhering to recommended goals of 1800-1900 calories and 90 grams of protein daily.   We discussed ways to increase protein. Handout given today: Protein Content of Food.  Exercise goals: Try to increase  walking.  Behavioral modification strategies: increasing lean protein intake and planning for success.  Ann Woods has agreed to follow-up with our clinic in 4 weeks at her request.  Objective:   Blood pressure 120/77, pulse 60, temperature 98.1 F (36.7 C), temperature source Oral, height 5\' 5"  (1.651 m), weight (!) 366 lb (166 kg), SpO2 98 %. Body mass index is 60.91 kg/m.  General: Cooperative, alert, well developed, in no acute distress. HEENT: Conjunctivae and lids unremarkable. Cardiovascular: Regular rhythm.  Lungs: Normal work of breathing. Neurologic: No focal deficits.   Lab Results  Component Value Date   CREATININE 0.65 03/12/2020   BUN 7 03/12/2020   NA 142 03/12/2020   K 4.2 03/12/2020   CL 108 03/12/2020   CO2 31 03/12/2020   Lab Results  Component Value Date   ALT 10 03/12/2020   AST 16 03/12/2020   GGT 28 06/17/2018   ALKPHOS 97 01/30/2020   BILITOT 0.8 03/12/2020   Lab Results  Component Value Date   HGBA1C 5.5 01/30/2020   HGBA1C 5.8 (H) 10/10/2019   HGBA1C 6.0 (H) 06/23/2019   HGBA1C 5.8 (H) 02/03/2019   HGBA1C 5.7 (H) 08/31/2018   Lab Results  Component Value Date   INSULIN 13.2 01/30/2020   INSULIN 20.8 10/10/2019   INSULIN 16.0 06/23/2019   INSULIN 12.7 02/03/2019   INSULIN 10.4 08/31/2018   Lab Results  Component Value Date   TSH 1.520 06/23/2019   Lab Results  Component Value Date   CHOL 154 10/10/2019  HDL 36 (L) 10/10/2019   LDLCALC 96 10/10/2019   TRIG 122 10/10/2019   CHOLHDL 3.3 03/02/2018   Lab Results  Component Value Date   WBC 3.4 08/10/2019   HGB 11.9 08/10/2019   HCT 36.9 08/10/2019   MCV 79 08/10/2019   PLT 239 08/10/2019   Lab Results  Component Value Date   IRON 34 02/03/2019   TIBC 355 02/03/2019   FERRITIN 23 02/03/2019   Attestation Statements:   Reviewed by clinician on day of visit: allergies, medications, problem list, medical history, surgical history, family history, social history, and  previous encounter notes.   Ann Woods, am acting as Location manager for [employer], FNP-C.  I have reviewed the above documentation for accuracy and completeness, and I agree with the above. -  Georgianne Fick, FNP

## 2020-05-18 ENCOUNTER — Telehealth: Payer: Self-pay

## 2020-05-18 ENCOUNTER — Other Ambulatory Visit: Payer: Self-pay

## 2020-05-18 ENCOUNTER — Other Ambulatory Visit (INDEPENDENT_AMBULATORY_CARE_PROVIDER_SITE_OTHER): Payer: Managed Care, Other (non HMO)

## 2020-05-18 DIAGNOSIS — N3001 Acute cystitis with hematuria: Secondary | ICD-10-CM | POA: Diagnosis not present

## 2020-05-18 LAB — POCT URINALYSIS DIP (PROADVANTAGE DEVICE)
Bilirubin, UA: NEGATIVE
Blood, UA: NEGATIVE
Glucose, UA: NEGATIVE mg/dL
Ketones, POC UA: NEGATIVE mg/dL
Leukocytes, UA: NEGATIVE
Nitrite, UA: NEGATIVE
Protein Ur, POC: NEGATIVE mg/dL
Specific Gravity, Urine: 1.03
Urobilinogen, Ur: 0.2
pH, UA: 5.5 (ref 5.0–8.0)

## 2020-05-18 NOTE — Telephone Encounter (Signed)
Pt u/a was wnl. Please advise [facility]

## 2020-05-18 NOTE — Telephone Encounter (Signed)
I sent her a Pharmacist, community message. Thanks.

## 2020-05-24 ENCOUNTER — Ambulatory Visit (INDEPENDENT_AMBULATORY_CARE_PROVIDER_SITE_OTHER): Payer: Managed Care, Other (non HMO) | Admitting: Family Medicine

## 2020-05-24 ENCOUNTER — Encounter: Payer: Self-pay | Admitting: Family Medicine

## 2020-05-24 VITALS — BP 149/88 | HR 82 | Ht 65.0 in | Wt 370.0 lb

## 2020-05-24 DIAGNOSIS — G4733 Obstructive sleep apnea (adult) (pediatric): Secondary | ICD-10-CM | POA: Diagnosis not present

## 2020-05-24 DIAGNOSIS — Z9989 Dependence on other enabling machines and devices: Secondary | ICD-10-CM

## 2020-05-24 NOTE — Patient Instructions (Signed)
Please continue using your CPAP regularly. While your insurance requires that you use CPAP at least 4 hours each night on 70% of the nights, I recommend, that you not skip any nights and use it throughout the night if you can. Getting used to CPAP and staying with the treatment long term does take time and patience and discipline. Untreated obstructive sleep apnea when it is moderate to severe can have an adverse impact on cardiovascular health and raise her risk for heart disease, arrhythmias, hypertension, congestive heart failure, stroke and diabetes. Untreated obstructive sleep apnea causes sleep disruption, nonrestorative sleep, and sleep deprivation. This can have an impact on your day to day functioning and cause daytime sleepiness and impairment of cognitive function, memory loss, mood disturbance, and problems focussing. Using CPAP regularly can improve these symptoms.   Follow up in 6 months    Sleep Apnea Sleep apnea affects breathing during sleep. It causes breathing to stop for a short time or to become shallow. It can also increase the risk of:  Heart attack.  Stroke.  Being very overweight (obese).  Diabetes.  Heart failure.  Irregular heartbeat. The goal of treatment is to help you breathe normally again. What are the causes? There are three kinds of sleep apnea:  Obstructive sleep apnea. This is caused by a blocked or collapsed airway.  Central sleep apnea. This happens when the brain does not send the right signals to the muscles that control breathing.  Mixed sleep apnea. This is a combination of obstructive and central sleep apnea. The most common cause of this condition is a collapsed or blocked airway. This can happen if:  Your throat muscles are too relaxed.  Your tongue and tonsils are too large.  You are overweight.  Your airway is too small. What increases the risk?  Being overweight.  Smoking.  Having a small airway.  Being older.  Being  female.  Drinking alcohol.  Taking medicines to calm yourself (sedatives or tranquilizers).  Having family members with the condition. What are the signs or symptoms?  Trouble staying asleep.  Being sleepy or tired during the day.  Getting angry a lot.  Loud snoring.  Headaches in the morning.  Not being able to focus your mind (concentrate).  Forgetting things.  Less interest in sex.  Mood swings.  Personality changes.  Feelings of sadness (depression).  Waking up a lot during the night to pee (urinate).  Dry mouth.  Sore throat. How is this diagnosed?  Your medical history.  A physical exam.  A test that is done when you are sleeping (sleep study). The test is most often done in a sleep lab but may also be done at home. How is this treated?   Sleeping on your side.  Using a medicine to get rid of mucus in your nose (decongestant).  Avoiding the use of alcohol, medicines to help you relax, or certain pain medicines (narcotics).  Losing weight, if needed.  Changing your diet.  Not smoking.  Using a machine to open your airway while you sleep, such as: ? An oral appliance. This is a mouthpiece that shifts your lower jaw forward. ? A CPAP device. This device blows air through a mask when you breathe out (exhale). ? An EPAP device. This has valves that you put in each nostril. ? A BPAP device. This device blows air through a mask when you breathe in (inhale) and breathe out.  Having surgery if other treatments do not work. It   is important to get treatment for sleep apnea. Without treatment, it can lead to:  High blood pressure.  Coronary artery disease.  In men, not being able to have an erection (impotence).  Reduced thinking ability. Follow these instructions at home: Lifestyle  Make changes that your doctor recommends.  Eat a healthy diet.  Lose weight if needed.  Avoid alcohol, medicines to help you relax, and some pain  medicines.  Do not use any products that contain nicotine or tobacco, such as cigarettes, e-cigarettes, and chewing tobacco. If you need help quitting, ask your doctor. General instructions  Take over-the-counter and prescription medicines only as told by your doctor.  If you were given a machine to use while you sleep, use it only as told by your doctor.  If you are having surgery, make sure to tell your doctor you have sleep apnea. You may need to bring your device with you.  Keep all follow-up visits as told by your doctor. This is important. Contact a doctor if:  The machine that you were given to use during sleep bothers you or does not seem to be working.  You do not get better.  You get worse. Get help right away if:  Your chest hurts.  You have trouble breathing in enough air.  You have an uncomfortable feeling in your back, arms, or stomach.  You have trouble talking.  One side of your body feels weak.  A part of your face is hanging down. These symptoms may be an emergency. Do not wait to see if the symptoms will go away. Get medical help right away. Call your local emergency services (911 in the [place] Do not drive yourself to the hospital. Summary  This condition affects breathing during sleep.  The most common cause is a collapsed or blocked airway.  The goal of treatment is to help you breathe normally while you sleep. This information is not intended to replace advice given to you by your health care provider. Make sure you discuss any questions you have with your health care provider. Document Revised: 05/28/2018 Document Reviewed: 04/06/2018 Elsevier Patient Education  2020 Elsevier Inc.  

## 2020-05-24 NOTE — Progress Notes (Addendum)
PATIENT: Ann Woods DOB: December 09, 1975  REASON FOR VISIT: follow up HISTORY FROM: patient  Chief Complaint  Patient presents with  . Follow-up    rm 2  . Sleep Apnea    pt having no concerns with her cpap     HISTORY OF PRESENT ILLNESS: Today 05/24/20 Ann Woods is a 44 y.o. female here today for follow up for OSA on CPAP. She is doing better with CPAP therapy. She continues to have difficulty remembering to start therapy before going to sleep. She often drifts off to sleep while looking at her phone. She does recognize benefit of CPAP therapy. She has changed to a full face mask and feels it is a better fit for her.   Compliance report dated 04/23/2020 through 05/22/2020 reveals that she used CPAP 20 the past 30 days for compliance of 67%.  She used CPAP greater than 4 hours 16 of the past 30 days for compliance of 53%  Average usage on days used was 4 hours and 49 minutes.  Residual AHI was 2.0 on 7 to 14 cm of water and an EPR of 3.  There was a leak noted in the 95th percentile of 23 L/min.  HISTORY: (copied from Dr Guadelupe Sabin note on 03/06/2020)  Ann Woods is a 44 year old right-handed woman with an underlying medical history of prediabetes, hypertension, hyperlipidemia, hepatitis B, anemia, seasonal allergies, lower extremity swelling and morbid obesity with a BMI of over 60, who Presents for follow-up consultation of his severe obstructive sleep apnea after interim home sleep testing and starting AutoPap therapy. The patient is unaccompanied today. I first met her on 11/10/2019 at the request of her primary care, at which time she reported snoring and excessive daytime somnolence. She was advised to proceed with sleep study testing. She had a home sleep test on 12/12/2019 which indicated severe obstructive sleep apnea with an AHI of 86.6/h, O2 nadir of 58%. She was advised to start AutoPap therapy.   Today, 02/20/2020: I reviewed her AutoPap compliance data from 01/17/2020  through 02/15/2020, which is a total of 30 days, during which time she used her machine only 16 days with percent use days greater than 4 hours at 13% only, indicating low compliance with an average usage of 3 hours and 10 minutes, residual AHI 2/h, 95th percentile pressure of 13 cm, range of 7 to 14 cm with EPR of 3, leak on the higher side with a 95th percentile at 17.9 L/min. Her set up date was 01/03/20. She reports that she is still adjusting to treatment.  Her biggest challenge is to remember to put the machine on.  She has woken up and had pulled the mask off inadvertently as well.  She has noticed already some improvements including less daytime somnolence, less nocturia and less headaches when she uses the machine and in the past few days she has used it more consistently.  She tracks her usage through the app as well.  She is motivated to continue.  She is working on weight loss and is followed by the healthy weight and wellness clinic through [facility].  The patient's allergies, current medications, family history, past medical history, past social history, past surgical history and problem list were reviewed and updated as appropriate.   Previously:   11/10/19: (She) reports snoring and excessive daytime somnolence. I reviewed the office note from 10/31/2019. Her Epworth sleepiness score is 15/24,fatigue severity score is 41/63. She has had occasional AM headaches. She  has nocturia about 1/night. Bedtime is around 10, but she has difficulty falling asleep, may be awake till 1 AM. Rise time is 5:45 AM. She lives with her BF and 2 kids, ages 32 and 22. They have 1 dog in the the home. TV in the bedroom tends to stay on at night. She is a NS and does not drink alcohol, no daily caffeine. 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    REVIEW OF SYSTEMS: Out of a complete 14 system review of symptoms, the patient complains only of the following symptoms, fatigue and all other reviewed  systems are negative.  ESS:15 FSS: 25  ALLERGIES: No Known Allergies  HOME MEDICATIONS: Outpatient Medications Prior to Visit  Medication Sig Dispense Refill  . albuterol (VENTOLIN HFA) 108 (90 Base) MCG/ACT inhaler Inhale 2 puffs into the lungs every 6 (six) hours as needed for wheezing or shortness of breath.    [name] amLODipine-benazepril (LOTREL) 5-10 MG capsule Take 1 capsule by mouth daily. 30 capsule 0  . azelastine (ASTELIN) 0.1 % nasal spray Use 1 spray in each nostril twice daily 30 mL 5  . azelastine (OPTIVAR) 0.05 % ophthalmic solution Use 1 drop in each eye twice daily as needed 6 mL 5  . cetirizine (ZYRTEC ALLERGY) 10 MG tablet Take 1 tablet (10 mg total) by mouth 2 (two) times daily. 60 tablet 5  . famotidine (PEPCID) 20 MG tablet Take 1 tablet (20 mg total) by mouth 2 (two) times daily. 60 tablet 5  . ferrous sulfate 325 (65 FE) MG tablet Take 1 tablet (325 mg total) by mouth daily with breakfast. 30 tablet 0  . levocetirizine (XYZAL) 5 MG tablet Take 5 mg by mouth every evening.    . methocarbamol (ROBAXIN) 500 MG tablet Take 1 tablet (500 mg total) by mouth 4 (four) times daily. 30 tablet 0  . montelukast (SINGULAIR) 10 MG tablet Take 1 tablet (10 mg total) by mouth at bedtime. 30 tablet 5  . Olopatadine HCl (PAZEO) 0.7 % SOLN Place 1 drop into both eyes daily. 2.5 mL 5  . sulfamethoxazole-trimethoprim (BACTRIM DS) 800-160 MG tablet Take 1 tablet by mouth 2 (two) times daily. 10 tablet 0  . [name] (CYANOCOBALAMIN) 500 MCG tablet Take 500 mcg by mouth daily.    . Vitamin D, Ergocalciferol, (DRISDOL) 1.25 MG (50000 UNIT) CAPS capsule Take 1 capsule (50,000 Units total) by mouth every 3 (three) days. 10 capsule 0  . zinc gluconate 50 MG tablet Take 50 mg by mouth daily.     No facility-administered medications prior to visit.    PAST MEDICAL HISTORY: Past Medical History:  Diagnosis Date  . Anemia   . Hepatitis B   . High cholesterol   . Hypertension    G2 -  preeclampsia, CHTN  . Infertility, female   . Lactose intolerance   . Obesity   . Obesity   . Prediabetes 04/01/2018  . Seasonal allergies   . Shortness of breath   . Swelling of both lower extremities   . Urticaria     PAST SURGICAL HISTORY: Past Surgical History:  Procedure Laterality Date  . CESAREAN SECTION    . HIP SURGERY    . TUBAL LIGATION Bilateral 05/05/2014   Procedure: POST PARTUM TUBAL LIGATION;  Surgeon: Donnamae Jude, MD;  Location: [facility];  Service: Gynecology;  Laterality: Bilateral;    FAMILY HISTORY: Family History  Problem Relation Age of Onset  . Heart disease Mother   .  Thyroid disease Mother   . High blood pressure Mother   . Obesity Mother   . COPD Father   . Allergic rhinitis Neg Hx   . Angioedema Neg Hx   . Asthma Neg Hx   . Eczema Neg Hx   . Immunodeficiency Neg Hx   . Urticaria Neg Hx     SOCIAL HISTORY: Social History   Socioeconomic History  . Marital status: Significant Other    Spouse name: Not on file  . Number of children: Not on file  . Years of education: Not on file  . Highest education level: Not on file  Occupational History  . Occupation: Glass blower/designer  Tobacco Use  . Smoking status: Former Smoker    Years: 0.50    Types: Cigarettes    Quit date: 08/25/1993    Years since quitting: 26.7  . Smokeless tobacco: Never Used  . Tobacco comment: one pack of cigarettes weekly- 05/27/18  Vaping Use  . Vaping Use: Never used  Substance and Sexual Activity  . Alcohol use: Not Currently  . Drug use: No  . Sexual activity: Yes    Birth control/protection: None  Other Topics Concern  . Not on file  Social History Narrative  . Not on file   Social Determinants of Health   Financial Resource Strain:   . Difficulty of Paying Living Expenses: Not on file  Food Insecurity:   . Worried About Charity fundraiser in the Last Year: Not on file  . Ran Out of Food in the Last Year: Not on file  Transportation Needs:   . Lack of  Transportation (Medical): Not on file  . Lack of Transportation (Non-Medical): Not on file  Physical Activity:   . Days of Exercise per Week: Not on file  . Minutes of Exercise per Session: Not on file  Stress:   . Feeling of Stress : Not on file  Social Connections:   . Frequency of Communication with Friends and Family: Not on file  . Frequency of Social Gatherings with Friends and Family: Not on file  . Attends Religious Services: Not on file  . Active Member of Clubs or Organizations: Not on file  . Attends Archivist Meetings: Not on file  . Marital Status: Not on file  Intimate Partner Violence:   . Fear of Current or Ex-Partner: Not on file  . Emotionally Abused: Not on file  . Physically Abused: Not on file  . Sexually Abused: Not on file      PHYSICAL EXAM  Vitals:   05/24/20 1454  BP: (!) 149/88  Pulse: 82  Weight: (!) 370 lb (167.8 kg)  Height: 5' 5"  (1.651 m)   Body mass index is 61.57 kg/m.  Generalized: Well developed, in no acute distress  Cardiology: normal rate and rhythm, no murmur noted Respiratory: clear to auscultation bilaterally  Neurological examination  Mentation: Alert oriented to time, place, history taking. Follows all commands speech and language fluent Cranial nerve II-XII: Pupils were equal round reactive to light. Extraocular movements were full, visual field were full  Motor: The motor testing reveals 5 over 5 strength of all 4 extremities. Good symmetric motor tone is noted throughout.  Gait and station: Gait is normal.   DIAGNOSTIC DATA (LABS, IMAGING, TESTING) - I reviewed patient records, labs, notes, testing and imaging myself where available.  No flowsheet data found.   Lab Results  Component Value Date   WBC 3.4 08/10/2019   HGB  11.9 08/10/2019   HCT 36.9 08/10/2019   MCV 79 08/10/2019   PLT 239 08/10/2019      Component Value Date/Time   NA 142 03/12/2020 0924   NA 144 01/30/2020 1148   K 4.2 03/12/2020  0924   CL 108 03/12/2020 0924   CO2 31 03/12/2020 0924   GLUCOSE 99 03/12/2020 0924   BUN 7 03/12/2020 0924   BUN 17 01/30/2020 1148   CREATININE 0.65 03/12/2020 0924   CALCIUM 9.1 03/12/2020 0924   PROT 6.7 03/12/2020 0924   PROT 6.9 01/30/2020 1148   ALBUMIN 3.9 01/30/2020 1148   AST 16 03/12/2020 0924   ALT 10 03/12/2020 0924   ALT 15 06/17/2018 0939   ALKPHOS 97 01/30/2020 1148   BILITOT 0.8 03/12/2020 0924   BILITOT 0.5 01/30/2020 1148   GFRNONAA 109 03/12/2020 0924   GFRAA 126 03/12/2020 0924   Lab Results  Component Value Date   CHOL 154 10/10/2019   HDL 36 (L) 10/10/2019   LDLCALC 96 10/10/2019   TRIG 122 10/10/2019   CHOLHDL 3.3 03/02/2018   Lab Results  Component Value Date   HGBA1C 5.5 01/30/2020   Lab Results  Component Value Date   VITAMINB12 335 10/10/2019   Lab Results  Component Value Date   TSH 1.520 06/23/2019       ASSESSMENT AND PLAN 44 y.o. year old female  has a past medical history of Anemia, Hepatitis B, High cholesterol, Hypertension, Infertility, female, Lactose intolerance, Obesity, Obesity, Prediabetes (04/01/2018), Seasonal allergies, Shortness of breath, Swelling of both lower extremities, and Urticaria. here with     ICD-10-CM   1. OSA on CPAP  G47.33    Z99.89      [facility] continues to work on increasing CPAP compliance.  Most recent report does indicate improvement.  She does note benefit with using CPAP therapy at home.  She was encouraged to use CPAP nightly and for greater than 4 hours each night.  She was advised to monitor for correct mask seal at home.  She will call me with any continued concerns.  Healthy lifestyle habits encouraged.  She will follow-up with me in 6 months, sooner if needed.  She verbalizes understanding and agreement with this plan.  No orders of the defined types were placed in this encounter.    No orders of the defined types were placed in this encounter.     I spent 20 minutes with the patient.  50% of this time was spent counseling and educating patient on plan of care and medications.    Debbora Presto, FNP-C 05/24/2020, 3:04 PM [facility] [address] [place], [place] [address] [phone]  I reviewed the above note and documentation by the Nurse Practitioner and agree with the history, exam, assessment and plan as outlined above. I was available for consultation. Star Age, MD, PhD [facility])

## 2020-06-01 ENCOUNTER — Ambulatory Visit (INDEPENDENT_AMBULATORY_CARE_PROVIDER_SITE_OTHER): Payer: Managed Care, Other (non HMO) | Admitting: Obstetrics and Gynecology

## 2020-06-01 ENCOUNTER — Other Ambulatory Visit: Payer: Self-pay

## 2020-06-01 ENCOUNTER — Encounter: Payer: Self-pay | Admitting: Obstetrics and Gynecology

## 2020-06-01 ENCOUNTER — Other Ambulatory Visit (HOSPITAL_COMMUNITY)
Admission: RE | Admit: 2020-06-01 | Discharge: 2020-06-01 | Disposition: A | Discharge status: Home / Self Care | Payer: Managed Care, Other (non HMO) | Source: Ambulatory Visit | Attending: Obstetrics and Gynecology | Admitting: Obstetrics and Gynecology

## 2020-06-01 VITALS — BP 136/82 | HR 71 | Wt 365.0 lb

## 2020-06-01 DIAGNOSIS — Z01419 Encounter for gynecological examination (general) (routine) without abnormal findings: Secondary | ICD-10-CM | POA: Insufficient documentation

## 2020-06-01 NOTE — Progress Notes (Signed)
Subjective:     Ann Woods is a 44 y.o. female P3 with BMI 60 who is here for a comprehensive physical exam. The patient reports amenorrhea since May. She reports a monthly 5 day period prior to that. She denies vasomotor symptoms. She denies pelvic pain or abnormal discharge. Patient is sexually active without complaints. Patient denies urinary incontinence  Past Medical History:  Diagnosis Date  . Anemia   . Hepatitis B   . High cholesterol   . Hypertension    G2 - preeclampsia, CHTN  . Infertility, female   . Lactose intolerance   . Obesity   . Obesity   . Prediabetes 04/01/2018  . Seasonal allergies   . Shortness of breath   . Swelling of both lower extremities   . Urticaria    Past Surgical History:  Procedure Laterality Date  . CESAREAN SECTION    . HIP SURGERY    . TUBAL LIGATION Bilateral 05/05/2014   Procedure: POST PARTUM TUBAL LIGATION;  Surgeon: Donnamae Jude, MD;  Location: [facility];  Service: Gynecology;  Laterality: Bilateral;   Family History  Problem Relation Age of Onset  . Heart disease Mother   . Thyroid disease Mother   . High blood pressure Mother   . Obesity Mother   . COPD Father   . Allergic rhinitis Neg Hx   . Angioedema Neg Hx   . Asthma Neg Hx   . Eczema Neg Hx   . Immunodeficiency Neg Hx   . Urticaria Neg Hx     Social History   Socioeconomic History  . Marital status: Significant Other    Spouse name: Not on file  . Number of children: Not on file  . Years of education: Not on file  . Highest education level: Not on file  Occupational History  . Occupation: Glass blower/designer  Tobacco Use  . Smoking status: Former Smoker    Years: 0.50    Types: Cigarettes    Quit date: 08/25/1993    Years since quitting: 26.7  . Smokeless tobacco: Never Used  . Tobacco comment: one pack of cigarettes weekly- 05/27/18  Vaping Use  . Vaping Use: Never used  Substance and Sexual Activity  . Alcohol use: Not Currently  . Drug use: No  . Sexual  activity: Yes    Birth control/protection: None  Other Topics Concern  . Not on file  Social History Narrative  . Not on file   Social Determinants of Health   Financial Resource Strain:   . Difficulty of Paying Living Expenses: Not on file  Food Insecurity:   . Worried About Charity fundraiser in the Last Year: Not on file  . Ran Out of Food in the Last Year: Not on file  Transportation Needs:   . Lack of Transportation (Medical): Not on file  . Lack of Transportation (Non-Medical): Not on file  Physical Activity:   . Days of Exercise per Week: Not on file  . Minutes of Exercise per Session: Not on file  Stress:   . Feeling of Stress : Not on file  Social Connections:   . Frequency of Communication with Friends and Family: Not on file  . Frequency of Social Gatherings with Friends and Family: Not on file  . Attends Religious Services: Not on file  . Active Member of Clubs or Organizations: Not on file  . Attends Archivist Meetings: Not on file  . Marital Status: Not on file  Intimate Partner Violence:   . Fear of Current or Ex-Partner: Not on file  . Emotionally Abused: Not on file  . Physically Abused: Not on file  . Sexually Abused: Not on file   Health Maintenance  Topic Date Due  . COVID-19 Vaccine (1) Never done  . INFLUENZA VACCINE  03/25/2020  . PAP SMEAR-Modifier  03/02/2021  . MAMMOGRAM  04/16/2021  . TETANUS/TDAP  02/17/2024  . Hepatitis C Screening  Completed  . HIV Screening  Completed       Review of Systems Pertinent items noted in HPI and remainder of comprehensive ROS otherwise negative.   Objective:  Blood pressure 136/82, pulse 71, weight (!) 365 lb (165.6 kg).     GENERAL: Well-developed, well-nourished female in no acute distress.  HEENT: Normocephalic, atraumatic. Sclerae anicteric.  NECK: Supple. Normal thyroid.  LUNGS: Clear to auscultation bilaterally.  HEART: Regular rate and rhythm. BREASTS: Symmetric in size. No  palpable masses or lymphadenopathy, skin changes, or nipple drainage. ABDOMEN: Soft, nontender, nondistended. No organomegaly. PELVIC: Normal external female genitalia. Vagina is pink and rugated.  Normal discharge. Normal appearing cervix. Bimanual limited secondary to body habitus EXTREMITIES: No cyanosis, clubbing, or edema, 2+ distal pulses.    Assessment:    Healthy female exam.      Plan:    PAP smear collected STI testing per patient request [place], [place] ordered Patient will be contacted with abnormal results See After Visit Summary for Counseling Recommendations

## 2020-06-01 NOTE — Progress Notes (Signed)
GYN presents for AEX/PAP/STD screening.  C/o no periods since May 2021.  PHQ-9=1

## 2020-06-03 LAB — CERVICOVAGINAL ANCILLARY ONLY
Bacterial Vaginitis (gardnerella): NEGATIVE
Candida Glabrata: NEGATIVE
Candida Vaginitis: NEGATIVE
Chlamydia: NEGATIVE
Comment: NEGATIVE
Comment: NEGATIVE
Comment: NEGATIVE
Comment: NEGATIVE
Comment: NEGATIVE
Comment: NORMAL
Neisseria Gonorrhea: NEGATIVE
Trichomonas: NEGATIVE

## 2020-06-04 LAB — CYTOLOGY - PAP
Comment: NEGATIVE
Diagnosis: NEGATIVE
High risk HPV: NEGATIVE

## 2020-06-05 LAB — HEPATITIS B SURFACE ANTIGEN

## 2020-06-05 LAB — FOLLICLE STIMULATING HORMONE: FSH: 30 m[IU]/mL

## 2020-06-05 LAB — HEPATITIS B SURFACE AG, CONFIRM: HBsAG Confirmation: POSITIVE — AB

## 2020-06-05 LAB — HEPATITIS C ANTIBODY: Hep C Virus Ab: <0.1 s/co ratio (ref 0.0–0.9)

## 2020-06-05 LAB — HIV ANTIBODY (ROUTINE TESTING W REFLEX): HIV Screen 4th Generation wRfx: NONREACTIVE

## 2020-06-05 LAB — LUTEINIZING HORMONE: LH: 21.5 m[IU]/mL

## 2020-06-05 LAB — RPR: RPR Ser Ql: NONREACTIVE

## 2020-06-11 ENCOUNTER — Ambulatory Visit (INDEPENDENT_AMBULATORY_CARE_PROVIDER_SITE_OTHER): Payer: Managed Care, Other (non HMO) | Admitting: Family Medicine

## 2020-06-11 ENCOUNTER — Other Ambulatory Visit: Payer: Self-pay

## 2020-06-11 ENCOUNTER — Encounter (INDEPENDENT_AMBULATORY_CARE_PROVIDER_SITE_OTHER): Payer: Self-pay | Admitting: Family Medicine

## 2020-06-11 VITALS — BP 126/84 | HR 61 | Temp 97.8°F | Ht 65.0 in | Wt 364.0 lb

## 2020-06-11 DIAGNOSIS — I10 Essential (primary) hypertension: Secondary | ICD-10-CM | POA: Diagnosis not present

## 2020-06-11 DIAGNOSIS — Z6841 Body Mass Index (BMI) 40.0 and over, adult: Secondary | ICD-10-CM

## 2020-06-12 ENCOUNTER — Encounter (INDEPENDENT_AMBULATORY_CARE_PROVIDER_SITE_OTHER): Payer: Self-pay | Admitting: Family Medicine

## 2020-06-12 NOTE — Progress Notes (Signed)
Chief Complaint:   Ann Woods is here to discuss her progress with her Ann treatment plan along with follow-up of her Ann related diagnoses. Ann Woods is keeping a food journal and adhering to recommended goals of 1800-1900 calories and 90 grams of protein and states she is following her eating plan approximately 100% of the time. Ann Woods states she is exercising 0 minutes 0 times per week.  Today's visit was #: 50 Starting weight: 403 lbs Starting date: 08/31/2018 Today's weight: 364 lbs Today's date: 06/11/2020 Total lbs lost to date: 39 Total lbs lost since last in-office visit: 2  Interim History: Ann Woods is still not meeting protein goals and averages 60 grams daily. Calories are at goal. She is vegan which makes attaining her protein goal more difficult. She is trying to cut back on sodium. Meat-free burgers tend to be high in sodium so limiting these has impacted her protein intake. She notes edema with excessive sodium intake. Hunger is well controlled.  Subjective:   HTN (hypertension), benign. Blood pressure is well controlled. Ann Woods has been limiting sodium due to being bloated. She is on Lotrel.  BP Readings from Last 3 Encounters:  06/11/20 126/84  06/01/20 136/82  05/24/20 (!) 149/88   Lab Results  Component Value Date   CREATININE 0.65 03/12/2020   CREATININE 0.94 01/30/2020   CREATININE 0.75 08/15/2019   Assessment/Plan:   HTN (hypertension), benign.  She will continue Lotrel as directed.   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  Ann Woods is currently in the action stage of change. As such, her goal is to continue with weight loss efforts. She has agreed to keeping a food journal and adhering to recommended goals of 1800-1900 calories and 90 grams of protein daily. She will limit her sodium intake to 2000 mg a day.  Exercise goals: All adults  should avoid inactivity. Some physical activity is better than none, and adults who participate in any amount of physical activity gain some health benefits.  Behavioral modification strategies: increasing lean protein intake.  Ann Woods has agreed to follow-up with our clinic in 4 weeks.   Objective:   Blood pressure 126/84, pulse 61, temperature 97.8 F (36.6 C), height 5\' 5"  (1.651 m), weight (!) 364 lb (165.1 kg), SpO2 100 %. Body mass index is 60.57 kg/m.  General: Cooperative, alert, well developed, in no acute distress. HEENT: Conjunctivae and lids unremarkable. Cardiovascular: Regular rhythm.  Lungs: Normal work of breathing. Neurologic: No focal deficits.   Lab Results  Component Value Date   CREATININE 0.65 03/12/2020   BUN 7 03/12/2020   NA 142 03/12/2020   K 4.2 03/12/2020   CL 108 03/12/2020   CO2 31 03/12/2020   Lab Results  Component Value Date   ALT 10 03/12/2020   AST 16 03/12/2020   GGT 28 06/17/2018   ALKPHOS 97 01/30/2020   BILITOT 0.8 03/12/2020   Lab Results  Component Value Date   HGBA1C 5.5 01/30/2020   HGBA1C 5.8 (H) 10/10/2019   HGBA1C 6.0 (H) 06/23/2019   HGBA1C 5.8 (H) 02/03/2019   HGBA1C 5.7 (H) 08/31/2018   Lab Results  Component Value Date   INSULIN 13.2 01/30/2020   INSULIN 20.8 10/10/2019   INSULIN 16.0 06/23/2019   INSULIN 12.7 02/03/2019   INSULIN 10.4 08/31/2018   Lab Results  Component Value Date   TSH 1.520 06/23/2019   Lab  Results  Component Value Date   CHOL 154 10/10/2019   HDL 36 (L) 10/10/2019   LDLCALC 96 10/10/2019   TRIG 122 10/10/2019   CHOLHDL 3.3 03/02/2018   Lab Results  Component Value Date   WBC 3.4 08/10/2019   HGB 11.9 08/10/2019   HCT 36.9 08/10/2019   MCV 79 08/10/2019   PLT 239 08/10/2019   Lab Results  Component Value Date   IRON 34 02/03/2019   TIBC 355 02/03/2019   FERRITIN 23 02/03/2019   Attestation Statements:   Reviewed by clinician on day of visit: allergies, medications,  problem list, medical history, surgical history, family history, social history, and previous encounter notes.  IMichaelene Song, am acting as Location manager for [employer], FNP-C   I have reviewed the above documentation for accuracy and completeness, and I agree with the above. -  Georgianne Fick, FNP

## 2020-07-09 ENCOUNTER — Ambulatory Visit (INDEPENDENT_AMBULATORY_CARE_PROVIDER_SITE_OTHER): Payer: Managed Care, Other (non HMO) | Admitting: Family Medicine

## 2020-07-09 ENCOUNTER — Encounter (INDEPENDENT_AMBULATORY_CARE_PROVIDER_SITE_OTHER): Payer: Self-pay | Admitting: Family Medicine

## 2020-07-09 ENCOUNTER — Other Ambulatory Visit: Payer: Self-pay

## 2020-07-09 VITALS — BP 125/81 | HR 65 | Temp 98.6°F | Ht 65.0 in | Wt 353.0 lb

## 2020-07-09 DIAGNOSIS — Z6841 Body Mass Index (BMI) 40.0 and over, adult: Secondary | ICD-10-CM

## 2020-07-09 DIAGNOSIS — I10 Essential (primary) hypertension: Secondary | ICD-10-CM

## 2020-07-09 NOTE — Progress Notes (Signed)
The 10-year ASCVD risk score [name] [name]., et al., 2013) is: 3.4%   Values used to calculate the score:     Age: 44 years     Sex: Female     Is Non-Hispanic African American: Yes     Diabetic: No     Tobacco smoker: No     Systolic Blood Pressure: 825 mmHg     Is BP treated: Yes     HDL Cholesterol: 36 mg/dL     Total Cholesterol: 154 mg/dL

## 2020-07-10 NOTE — Progress Notes (Signed)
Chief Complaint:   OBESITY Ann Woods is here to discuss her progress with her obesity treatment plan along with follow-up of her obesity related diagnoses. Ann Woods is on keeping a food journal and adhering to recommended goals of 1800-1900 calories and 90 grams of protein daily and states she is following her eating plan approximately 100% of the time. Ann Woods states she is doing 0 minutes 0 times per week.  Today's visit was #: 42 Starting weight: 403 lbs Starting date: 08/31/2018 Today's weight: 353 lbs Today's date: 07/09/2020 Total lbs lost to date: 50 Total lbs lost since last in-office visit: 11  Interim History: Ann Woods has increased protein intake to 80 grams daily. She is vegan so getting adequate protein is a struggle. She is also trying to keep sodium <2500 mg daily because she tends to have edema with too much sodium. She has not been journaling consistently the past week. Her brother passed away unexpectedly last week. She is down 50 lbs total since January 2020. She feels she is getting in at least 1800 calories daily.   Subjective:   1. Essential hypertension Ann Woods's blood pressure is well controlled. She is keeping sodium below 2500 mg. She is on Lotrel. Cardiovascular ROS: no chest pain or dyspnea on exertion.  BP Readings from Last 3 Encounters:  07/09/20 125/81  06/11/20 126/84  06/01/20 136/82   Lab Results  Component Value Date   CREATININE 0.65 03/12/2020   CREATININE 0.94 01/30/2020   CREATININE 0.75 08/15/2019   Assessment/Plan:   1. Essential hypertension Ann Woods will continue Lotrel.  2. Class 3 severe obesity with serious comorbidity and body mass index (BMI) of 50.0 to 59.9 in adult, unspecified obesity type [name]) Ann Woods is currently in the action stage of change. As such, her goal is to continue with weight loss efforts. She has agreed to keeping a food journal and adhering to recommended goals of 1800-1900 calories and 90 grams of protein  daily.   Exercise goals: Consider starting exercise.  Behavioral modification strategies: increasing lean protein intake and keeping a strict food journal.  Ann Woods has agreed to follow-up with our clinic in 4 weeks.  Objective:   Blood pressure 125/81, pulse 65, temperature 98.6 F (37 C), height 5\' 5"  (1.651 m), weight (!) 353 lb (160.1 kg), SpO2 100 %. Body mass index is 58.74 kg/m.  General: Cooperative, alert, well developed, in no acute distress. HEENT: Conjunctivae and lids unremarkable. Cardiovascular: Regular rhythm.  Lungs: Normal work of breathing. Neurologic: No focal deficits.   Lab Results  Component Value Date   CREATININE 0.65 03/12/2020   BUN 7 03/12/2020   NA 142 03/12/2020   K 4.2 03/12/2020   CL 108 03/12/2020   CO2 31 03/12/2020   Lab Results  Component Value Date   ALT 10 03/12/2020   AST 16 03/12/2020   GGT 28 06/17/2018   ALKPHOS 97 01/30/2020   BILITOT 0.8 03/12/2020   Lab Results  Component Value Date   HGBA1C 5.5 01/30/2020   HGBA1C 5.8 (H) 10/10/2019   HGBA1C 6.0 (H) 06/23/2019   HGBA1C 5.8 (H) 02/03/2019   HGBA1C 5.7 (H) 08/31/2018   Lab Results  Component Value Date   INSULIN 13.2 01/30/2020   INSULIN 20.8 10/10/2019   INSULIN 16.0 06/23/2019   INSULIN 12.7 02/03/2019   INSULIN 10.4 08/31/2018   Lab Results  Component Value Date   TSH 1.520 06/23/2019   Lab Results  Component Value Date   CHOL 154 10/10/2019  HDL 36 (L) 10/10/2019   LDLCALC 96 10/10/2019   TRIG 122 10/10/2019   CHOLHDL 3.3 03/02/2018   Lab Results  Component Value Date   WBC 3.4 08/10/2019   HGB 11.9 08/10/2019   HCT 36.9 08/10/2019   MCV 79 08/10/2019   PLT 239 08/10/2019   Lab Results  Component Value Date   IRON 34 02/03/2019   TIBC 355 02/03/2019   FERRITIN 23 02/03/2019   Attestation Statements:   Reviewed by clinician on day of visit: allergies, medications, problem list, medical history, surgical history, family history, social  history, and previous encounter notes.   Ann Woods, am acting as Location manager for [employer], FNP-C.  I have reviewed the above documentation for accuracy and completeness, and I agree with the above. -  Ann Fick, FNP

## 2020-07-11 ENCOUNTER — Encounter (INDEPENDENT_AMBULATORY_CARE_PROVIDER_SITE_OTHER): Payer: Self-pay | Admitting: Family Medicine

## 2020-07-30 ENCOUNTER — Other Ambulatory Visit: Payer: Self-pay

## 2020-07-30 ENCOUNTER — Other Ambulatory Visit (INDEPENDENT_AMBULATORY_CARE_PROVIDER_SITE_OTHER): Payer: Managed Care, Other (non HMO)

## 2020-07-30 ENCOUNTER — Telehealth: Payer: Managed Care, Other (non HMO) | Admitting: Family Medicine

## 2020-07-30 ENCOUNTER — Encounter: Payer: Self-pay | Admitting: Family Medicine

## 2020-07-30 VITALS — Wt 365.0 lb

## 2020-07-30 DIAGNOSIS — R0989 Other specified symptoms and signs involving the circulatory and respiratory systems: Secondary | ICD-10-CM

## 2020-07-30 DIAGNOSIS — R519 Headache, unspecified: Secondary | ICD-10-CM | POA: Diagnosis not present

## 2020-07-30 DIAGNOSIS — R059 Cough, unspecified: Secondary | ICD-10-CM

## 2020-07-30 DIAGNOSIS — R062 Wheezing: Secondary | ICD-10-CM

## 2020-07-30 LAB — POCT INFLUENZA A/B
Influenza A, POC: NEGATIVE
Influenza B, POC: NEGATIVE

## 2020-07-30 LAB — POC COVID19 BINAXNOW: SARS Coronavirus 2 Ag: NEGATIVE

## 2020-07-30 NOTE — Progress Notes (Signed)
   Subjective:  Documentation for virtual audio and video telecommunications through [facility] encounter: She will come to the office and get tested for flu and Covid later this afternoon.  The patient was located at home. 2 patient identifiers used.  The provider was located in the office. The patient did consent to this visit and is aware of possible charges through their insurance for this visit.  The other persons participating in this telemedicine service were none. Time spent on call was 16 minutes and in review of previous records 90minutes total.  This virtual service is not related to other E/M service within previous 7 days.   Patient ID: Ann Woods, female    DOB: [birth year], 44 y.o.   MRN: [id number]  HPI Chief Complaint  Patient presents with  . sick    cough, wheezing, SOB, symptoms started friday.    Complains of a 4 day history of sinus pain, chest congestion, coughing and wheezing. She had a headache 2 days ago.   Her first Covid Moderna vaccine was 07/14/2020. Initially she felt fine and then she started feeling very tired.    States she had a Covid test last week at her job before her symptoms started.  She works in [employer] and has to get tested weekly.  No fever, chills, ear pain, rhinorrhea, nasal congestion, sore throat, abdominal pain, N/V/D.  Denies loss of taste or smell.  Denies history of asthma.  Reports history of pneumonia.  She does not have an albuterol inhaler.   Reviewed allergies, medications, past medical, surgical, family, and social history.    Review of Systems Pertinent positives and negatives in the history of present illness.     Objective:   Physical Exam Wt (!) 365 lb (165.6 kg)   BMI 60.74 kg/m    Alert and oriented in no acute distress.  Respirations unlabored.  Speaking in complete sentences without difficulty.  Congested cough noted during the visit.     Assessment & Plan:  Chest congestion - Plan: POC  COVID-19 BinaxNow, Influenza A/B, Novel Coronavirus, NAA ([facility])  Cough - Plan: POC COVID-19 BinaxNow, Influenza A/B, Novel Coronavirus, NAA ([facility])  Wheezing  Acute nonintractable headache, unspecified headache type - Plan: POC COVID-19 BinaxNow, Influenza A/B, Novel Coronavirus, NAA ([facility])  She will come to the office this afternoon, to the parking lot, for  flu and Covid testing. Discussed symptomatic treatment including hydration, Tylenol or ibuprofen, Mucinex DM.  I will send in albuterol inhaler for her to use for chest congestion and wheezing. I will be in touch with her rapid results from today.  She is aware that we will continue to monitor her symptoms over the next day or 2 and she will let me know if she is getting much worse or not improving.

## 2020-07-31 ENCOUNTER — Telehealth: Payer: Self-pay | Admitting: Family Medicine

## 2020-07-31 LAB — SARS-COV-2, NAA 2 DAY TAT

## 2020-07-31 LAB — NOVEL CORONAVIRUS, NAA: SARS-CoV-2, NAA: NOT DETECTED

## 2020-07-31 MED ORDER — ALBUTEROL SULFATE HFA 108 (90 BASE) MCG/ACT IN AERS: 2.0000 | INHALATION_SPRAY | Freq: Four times a day (QID) | RESPIRATORY_TRACT | 0 refills | Status: AC | PRN

## 2020-07-31 NOTE — Telephone Encounter (Signed)
Pt called and states that at yest virtual appt she was informed that albuterol would be sent in for her. When she went to pharmacy it was not there. Please send.

## 2020-07-31 NOTE — Telephone Encounter (Signed)
Done

## 2020-07-31 NOTE — Addendum Note (Signed)
Addended by: Minette Headland A on: 07/31/2020 08:49 AM   Modules accepted: Orders

## 2020-08-06 ENCOUNTER — Ambulatory Visit (INDEPENDENT_AMBULATORY_CARE_PROVIDER_SITE_OTHER): Payer: Managed Care, Other (non HMO) | Admitting: Family Medicine

## 2020-08-06 ENCOUNTER — Encounter (INDEPENDENT_AMBULATORY_CARE_PROVIDER_SITE_OTHER): Payer: Self-pay | Admitting: Family Medicine

## 2020-08-06 ENCOUNTER — Other Ambulatory Visit: Payer: Self-pay

## 2020-08-06 VITALS — BP 134/88 | HR 77 | Temp 98.1°F | Ht 65.0 in | Wt 356.0 lb

## 2020-08-06 DIAGNOSIS — Z6841 Body Mass Index (BMI) 40.0 and over, adult: Secondary | ICD-10-CM

## 2020-08-06 DIAGNOSIS — E8881 Metabolic syndrome: Secondary | ICD-10-CM

## 2020-08-06 DIAGNOSIS — E88819 Insulin resistance, unspecified: Secondary | ICD-10-CM

## 2020-08-06 DIAGNOSIS — E559 Vitamin D deficiency, unspecified: Secondary | ICD-10-CM | POA: Diagnosis not present

## 2020-08-06 DIAGNOSIS — E7849 Other hyperlipidemia: Secondary | ICD-10-CM | POA: Diagnosis not present

## 2020-08-06 NOTE — Progress Notes (Signed)
Chief Complaint:   OBESITY Ann Woods is here to discuss her progress with her obesity treatment plan along with follow-up of her obesity related diagnoses. Ann Woods is on keeping a food journal and adhering to recommended goals of 1800-1900 calories and 90 grams of protein daily and states she is following her eating plan approximately 0% of the time. Ann Woods states she is doing 0 minutes 0 times per week.  Today's visit was #: 28 Starting weight: 403 lbs Starting date: 08/31/2018 Today's weight: 356 lbs Today's date: 08/06/2020 Total lbs lost to date: 7 Total lbs lost since last in-office visit: 0 (+3)  Interim History: Ann Woods notes she has not been journaling recently due to deaths in her family (brother and cousin). She feels she has been getting in adequate protein.  Subjective:   1. Vitamin D deficiency Ann Woods is on prescription Vit D every 3 days. Her last Vit D level was very low at 23.8.  2. Other hyperlipidemia Ann Woods's last LDL was at goal at 96, HDL was low at 36, and triglycerides were within normal limits. She is not on statin.   Lab Results  Component Value Date   ALT 10 03/12/2020   AST 16 03/12/2020   GGT 28 06/17/2018   ALKPHOS 97 01/30/2020   BILITOT 0.8 03/12/2020   Lab Results  Component Value Date   CHOL 154 10/10/2019   HDL 36 (L) 10/10/2019   LDLCALC 96 10/10/2019   TRIG 122 10/10/2019   CHOLHDL 3.3 03/02/2018   3. Insulin resistance Ann Woods has a diagnosis of insulin resistance based on her elevated fasting insulin level >5. She is not on metformin, and she denies polyphagia.  Lab Results  Component Value Date   INSULIN 13.2 01/30/2020   INSULIN 20.8 10/10/2019   INSULIN 16.0 06/23/2019   INSULIN 12.7 02/03/2019   INSULIN 10.4 08/31/2018   Lab Results  Component Value Date   HGBA1C 5.5 01/30/2020   Assessment/Plan:   1. Vitamin D deficiency  We will check labs today. Concetta agreed to continue taking prescription Vitamin D 50,000  IU every 3 days and will follow-up for routine testing of Vitamin D, at least 2-3 times per year to avoid over-replacement.  - VITAMIN D 25 Hydroxy (Vit-D Deficiency, Fractures)  2. Other hyperlipidemia  We will check labs today.  - Lipid Panel With LDL/HDL Ratio  3. Insulin resistance Shakesha will continue to work on weight loss, exercise, and decreasing simple carbohydrates to help decrease the risk of diabetes. We will check labs today. Ann Woods agreed to follow-up with [place] as directed to closely monitor her progress.  - Comprehensive metabolic panel - Hemoglobin A1c - Insulin, random  4. Class 3 severe obesity with serious comorbidity and body mass index (BMI) of 50.0 to 59.9 in adult, unspecified obesity type [facility]) Oline is currently in the action stage of change. As such, her goal is to continue with weight loss efforts. She has agreed to keeping a food journal and adhering to recommended goals of 1800-1900 calories and 90 grams of protein daily.   Robi will start journaling today.  Exercise goals: All adults should avoid inactivity. Some physical activity is better than none, and adults who participate in any amount of physical activity gain some health benefits.  Behavioral modification strategies: increasing lean protein intake and planning for success.  Ann Woods has agreed to follow-up with our clinic in 4 weeks. Ann Woods was informed we would discuss her lab results at her next visit unless there  is a critical issue that needs to be addressed sooner. Ann Woods agreed to keep her next visit at the agreed upon time to discuss these results.  Objective:   Blood pressure 134/88, pulse 77, temperature 98.1 F (36.7 C), height 5\' 5"  (1.651 m), weight (!) 356 lb (161.5 kg), SpO2 99 %. Body mass index is 59.24 kg/m.  General: Cooperative, alert, well developed, in no acute distress. HEENT: Conjunctivae and lids unremarkable. Cardiovascular: Regular rhythm.  Lungs: Normal work  of breathing. Neurologic: No focal deficits.   Lab Results  Component Value Date   CREATININE 0.65 03/12/2020   BUN 7 03/12/2020   NA 142 03/12/2020   K 4.2 03/12/2020   CL 108 03/12/2020   CO2 31 03/12/2020   Lab Results  Component Value Date   ALT 10 03/12/2020   AST 16 03/12/2020   GGT 28 06/17/2018   ALKPHOS 97 01/30/2020   BILITOT 0.8 03/12/2020   Lab Results  Component Value Date   HGBA1C 5.5 01/30/2020   HGBA1C 5.8 (H) 10/10/2019   HGBA1C 6.0 (H) 06/23/2019   HGBA1C 5.8 (H) 02/03/2019   HGBA1C 5.7 (H) 08/31/2018   Lab Results  Component Value Date   INSULIN 13.2 01/30/2020   INSULIN 20.8 10/10/2019   INSULIN 16.0 06/23/2019   INSULIN 12.7 02/03/2019   INSULIN 10.4 08/31/2018   Lab Results  Component Value Date   TSH 1.520 06/23/2019   Lab Results  Component Value Date   CHOL 154 10/10/2019   HDL 36 (L) 10/10/2019   LDLCALC 96 10/10/2019   TRIG 122 10/10/2019   CHOLHDL 3.3 03/02/2018   Lab Results  Component Value Date   WBC 3.4 08/10/2019   HGB 11.9 08/10/2019   HCT 36.9 08/10/2019   MCV 79 08/10/2019   PLT 239 08/10/2019   Lab Results  Component Value Date   IRON 34 02/03/2019   TIBC 355 02/03/2019   FERRITIN 23 02/03/2019   Attestation Statements:   Reviewed by clinician on day of visit: allergies, medications, problem list, medical history, surgical history, family history, social history, and previous encounter notes.   Wilhemena Durie, am acting as Location manager for [employer], FNP-C.  I have reviewed the above documentation for accuracy and completeness, and I agree with the above. -  Georgianne Fick, FNP

## 2020-08-07 LAB — COMPREHENSIVE METABOLIC PANEL
ALT: 16 IU/L (ref 0–32)
AST: 19 IU/L (ref 0–40)
Albumin/Globulin Ratio: 1.1 — ABNORMAL LOW (ref 1.2–2.2)
Albumin: 3.7 g/dL — ABNORMAL LOW (ref 3.8–4.8)
Alkaline Phosphatase: 111 IU/L (ref 44–121)
BUN/Creatinine Ratio: 9 (ref 9–23)
BUN: 6 mg/dL (ref 6–24)
Bilirubin Total: 0.9 mg/dL (ref 0.0–1.2)
CO2: 26 mmol/L (ref 20–29)
Calcium: 9.4 mg/dL (ref 8.7–10.2)
Chloride: 104 mmol/L (ref 96–106)
Creatinine, Ser: 0.7 mg/dL (ref 0.57–1.00)
GFR calc Af Amer: 122 mL/min/{1.73_m2} (ref >59)
GFR calc non Af Amer: 106 mL/min/{1.73_m2} (ref >59)
Globulin, Total: 3.5 g/dL (ref 1.5–4.5)
Glucose: 89 mg/dL (ref 65–99)
Potassium: 4.2 mmol/L (ref 3.5–5.2)
Sodium: 143 mmol/L (ref 134–144)
Total Protein: 7.2 g/dL (ref 6.0–8.5)

## 2020-08-07 LAB — LIPID PANEL WITH LDL/HDL RATIO
Cholesterol, Total: 188 mg/dL (ref 100–199)
HDL: 41 mg/dL (ref >39)
LDL Chol Calc (NIH): 131 mg/dL — ABNORMAL HIGH (ref 0–99)
LDL/HDL Ratio: 3.2 ratio (ref 0.0–3.2)
Triglycerides: 89 mg/dL (ref 0–149)
VLDL Cholesterol Cal: 16 mg/dL (ref 5–40)

## 2020-08-07 LAB — HEMOGLOBIN A1C
Est. average glucose Bld gHb Est-mCnc: 108 mg/dL
Hgb A1c MFr Bld: 5.4 % (ref 4.8–5.6)

## 2020-08-07 LAB — VITAMIN D 25 HYDROXY (VIT D DEFICIENCY, FRACTURES): Vit D, 25-Hydroxy: 36.7 ng/mL (ref 30.0–100.0)

## 2020-08-07 LAB — INSULIN, RANDOM: INSULIN: 9.2 u[IU]/mL (ref 2.6–24.9)

## 2020-08-23 ENCOUNTER — Encounter: Payer: Self-pay | Admitting: Family Medicine

## 2020-09-03 ENCOUNTER — Other Ambulatory Visit: Payer: Self-pay

## 2020-09-03 ENCOUNTER — Encounter (INDEPENDENT_AMBULATORY_CARE_PROVIDER_SITE_OTHER): Payer: Self-pay | Admitting: Family Medicine

## 2020-09-03 ENCOUNTER — Ambulatory Visit (INDEPENDENT_AMBULATORY_CARE_PROVIDER_SITE_OTHER): Payer: Managed Care, Other (non HMO) | Admitting: Family Medicine

## 2020-09-03 VITALS — BP 152/91 | HR 58 | Temp 97.8°F | Ht 65.0 in | Wt 366.0 lb

## 2020-09-03 DIAGNOSIS — E7849 Other hyperlipidemia: Secondary | ICD-10-CM | POA: Diagnosis not present

## 2020-09-03 DIAGNOSIS — I10 Essential (primary) hypertension: Secondary | ICD-10-CM

## 2020-09-03 DIAGNOSIS — E559 Vitamin D deficiency, unspecified: Secondary | ICD-10-CM

## 2020-09-03 DIAGNOSIS — Z6841 Body Mass Index (BMI) 40.0 and over, adult: Secondary | ICD-10-CM

## 2020-09-03 DIAGNOSIS — E8881 Metabolic syndrome: Secondary | ICD-10-CM

## 2020-09-03 NOTE — Progress Notes (Signed)
The 10-year ASCVD risk score [name] [name]., et al., 2013) is: 4.4%   Values used to calculate the score:     Age: 45 years     Sex: Female     Is Non-Hispanic African American: Yes     Diabetic: No     Tobacco smoker: No     Systolic Blood Pressure: 616 mmHg     Is BP treated: Yes     HDL Cholesterol: 41 mg/dL     Total Cholesterol: 188 mg/dL

## 2020-09-05 ENCOUNTER — Encounter (INDEPENDENT_AMBULATORY_CARE_PROVIDER_SITE_OTHER): Payer: Self-pay | Admitting: Family Medicine

## 2020-09-05 NOTE — Progress Notes (Signed)
Chief Complaint:   OBESITY Ann Woods is here to discuss her progress with her obesity treatment plan along with follow-up of her obesity related diagnoses. Ann Woods is on keeping a food journal and adhering to recommended goals of 1800-1900 calories and 90 grams of protein and states she is following her eating plan approximately 50% of the time. Ann Woods states she has not been exercising.  Today's visit was #: 48 Starting weight: 403 lbs Starting date: 08/31/2018 Today's weight: 366 lbs Today's date: 09/03/2020 Total lbs lost to date: 37 lbs Total lbs lost since last in-office visit: 0 (+10)  Interim History: Ann Woods is not journaling much and is not eating enough protein. She is up 10 lbs today. Ann Woods has been having veggie sandwiches without protein. She is vegan. She lacks time for meal prep and is working 7 days per week.  Subjective:   1. Other hyperlipidemia . Ann Woods's LDL is up to 131 from 96. HDL is low at 41 and TRIG is within normal limits. ASCVD risk score is 4.4%.  Lab Results  Component Value Date   ALT 16 08/06/2020   AST 19 08/06/2020   GGT 28 06/17/2018   ALKPHOS 111 08/06/2020   BILITOT 0.9 08/06/2020   Lab Results  Component Value Date   CHOL 188 08/06/2020   HDL 41 08/06/2020   LDLCALC 131 (H) 08/06/2020   TRIG 89 08/06/2020   CHOLHDL 3.3 03/02/2018   The 10-year ASCVD risk score [name] DC Jr., et al., 2013) is: 4.4%   Values used to calculate the score:     Age: 45 years     Sex: Female     Is Non-Hispanic African American: Yes     Diabetic: No     Tobacco smoker: No     Systolic Blood Pressure: 315 mmHg     Is BP treated: Yes     HDL Cholesterol: 41 mg/dL     Total Cholesterol: 188 mg/dL  2. Vitamin D deficiency Ann Woods's Vitamin D level is low but improved from 23.8 to 36.7. She notes fatigue due to stress and working long hours.   3. Insulin resistance  Ann Woods is no longer prediabetic. A1c had been up to 6.0. She is not on  Metformin.  Lab Results  Component Value Date   INSULIN 9.2 08/06/2020   INSULIN 13.2 01/30/2020   INSULIN 20.8 10/10/2019   INSULIN 16.0 06/23/2019   INSULIN 12.7 02/03/2019   Lab Results  Component Value Date   HGBA1C 5.4 08/06/2020    4. Essential hypertension  Ann Woods's blood pressure is elevated today. Normal last office visit. She is taking Lotrel and compliant with medication. No chest pain or shortness of breath. BP Readings from Last 3 Encounters:  09/03/20 (!) 152/91  08/06/20 134/88  07/09/20 125/81     Assessment/Plan:   1. Other hyperlipidemia  Ann Woods will work on weight loss.  2. Vitamin D deficiency Low Vitamin D level contributes to fatigue and are associated with obesity, breast, and colon cancer. She agrees to continue to take prescription Vitamin D @50 ,000 IU every week and will follow-up for routine testing of Vitamin D, at least 2-3 times per year to avoid over-replacement. Ann Woods will continue prescription Vitamin D.  3. Insulin resistance Ann Woods will continue to work on weight loss, exercise, and decreasing simple carbohydrates to help decrease the risk of diabetes. Ann Woods agreed to follow-up with [place] as directed to closely monitor her progress. Ann Woods will continue current meal plan.  4.  Essential hypertension Ann Woods is working on healthy weight loss and exercise to improve blood pressure control. We will watch for signs of hypotension as she continues her lifestyle modifications. Ann Woods will continue taking medication and monitor.  5. Class 3 severe obesity with serious comorbidity and body mass index (BMI) of 60.0 to 69.9 in adult, unspecified obesity type Ann Woods)  Ann Woods is currently in the action stage of change. As such, her goal is to continue with weight loss efforts. She has agreed to keeping a food journal and adhering to recommended goals of 1800-1900 calories and 90 grams of protein daily.   Exercise goals: No exercise has been  prescribed at this time.  Behavioral modification strategies: increasing lean protein intake, decreasing simple carbohydrates and planning for success.  Ann Woods has agreed to follow-up with our clinic in 3 weeks.  Objective:   Pulse (!) 58, temperature 97.8 F (36.6 C), height 5\' 5"  (1.651 m), weight (!) 366 lb (166 kg), SpO2 99 %. Body mass index is 60.91 kg/m.  General: Cooperative, alert, well developed, in no acute distress. HEENT: Conjunctivae and lids unremarkable. Cardiovascular: Regular rhythm.  Lungs: Normal work of breathing. Neurologic: No focal deficits.   Lab Results  Component Value Date   CREATININE 0.70 08/06/2020   BUN 6 08/06/2020   NA 143 08/06/2020   K 4.2 08/06/2020   CL 104 08/06/2020   CO2 26 08/06/2020   Lab Results  Component Value Date   ALT 16 08/06/2020   AST 19 08/06/2020   GGT 28 06/17/2018   ALKPHOS 111 08/06/2020   BILITOT 0.9 08/06/2020   Lab Results  Component Value Date   HGBA1C 5.4 08/06/2020   HGBA1C 5.5 01/30/2020   HGBA1C 5.8 (H) 10/10/2019   HGBA1C 6.0 (H) 06/23/2019   HGBA1C 5.8 (H) 02/03/2019   Lab Results  Component Value Date   INSULIN 9.2 08/06/2020   INSULIN 13.2 01/30/2020   INSULIN 20.8 10/10/2019   INSULIN 16.0 06/23/2019   INSULIN 12.7 02/03/2019   Lab Results  Component Value Date   TSH 1.520 06/23/2019   Lab Results  Component Value Date   CHOL 188 08/06/2020   HDL 41 08/06/2020   LDLCALC 131 (H) 08/06/2020   TRIG 89 08/06/2020   CHOLHDL 3.3 03/02/2018   Lab Results  Component Value Date   WBC 3.4 08/10/2019   HGB 11.9 08/10/2019   HCT 36.9 08/10/2019   MCV 79 08/10/2019   PLT 239 08/10/2019   Lab Results  Component Value Date   IRON 34 02/03/2019   TIBC 355 02/03/2019   FERRITIN 23 02/03/2019     Attestation Statements:   Reviewed by clinician on day of visit: allergies, medications, problem list, medical history, surgical history, family history, social history, and previous  encounter notes.    Tula Nakayama, am acting as Location manager for Georgianne Fick, FNP.  I have reviewed the above documentation for accuracy and completeness, and I agree with the above. -  Georgianne Fick, FNP

## 2020-09-27 ENCOUNTER — Other Ambulatory Visit: Payer: Self-pay

## 2020-09-27 ENCOUNTER — Ambulatory Visit (INDEPENDENT_AMBULATORY_CARE_PROVIDER_SITE_OTHER): Payer: Managed Care, Other (non HMO) | Admitting: Family Medicine

## 2020-09-27 ENCOUNTER — Encounter (INDEPENDENT_AMBULATORY_CARE_PROVIDER_SITE_OTHER): Payer: Self-pay | Admitting: Family Medicine

## 2020-09-27 VITALS — BP 138/64 | HR 78 | Temp 98.2°F | Ht 65.0 in | Wt 361.0 lb

## 2020-09-27 DIAGNOSIS — I10 Essential (primary) hypertension: Secondary | ICD-10-CM | POA: Diagnosis not present

## 2020-09-27 DIAGNOSIS — Z6841 Body Mass Index (BMI) 40.0 and over, adult: Secondary | ICD-10-CM | POA: Diagnosis not present

## 2020-09-27 NOTE — Progress Notes (Signed)
The 10-year ASCVD risk score [name] [name]., et al., 2013) is: 5.1%   Values used to calculate the score:     Age: 45 years     Sex: Female     Is Non-Hispanic African American: Yes     Diabetic: No     Tobacco smoker: No     Systolic Blood Pressure: 413 mmHg     Is BP treated: Yes     HDL Cholesterol: 41 mg/dL     Total Cholesterol: 188 mg/dL

## 2020-10-01 ENCOUNTER — Encounter (INDEPENDENT_AMBULATORY_CARE_PROVIDER_SITE_OTHER): Payer: Self-pay | Admitting: Family Medicine

## 2020-10-01 NOTE — Progress Notes (Signed)
Chief Complaint:   OBESITY Ann Woods is here to discuss her progress with her obesity treatment plan along with follow-up of her obesity related diagnoses. Ann Woods is on keeping a food journal and adhering to recommended goals of 1800-1900 calories and 90 grams of protein daily and states she is following her eating plan approximately 50% of the time. Ann Woods states she is doing 0 minutes 0 times per week.  Today's visit was #: 67 Starting weight: 403 lbs Starting date: 08/31/2018 Today's weight: 361 lbs Today's date: 09/27/2020 Total lbs lost to date: 42 Total lbs lost since last in-office visit: 5  Interim History: Ann Woods is vegan and is journaling. She averages at 60 grams of protein. She really struggles to meet her protein goal of 90 grams daily. Her calories are averaging between 1200-1300 per day. She is very busy with work and lacks rime for meal planning.  Subjective:   1. Essential hypertension Ann Woods's hypertension is well controlled. She tries to stay < 2000 mg of sodium daily. Her blood pressure runs borderline high at times. She is compliant with all of her medications.  BP Readings from Last 3 Encounters:  09/27/20 138/64  09/03/20 (!) 152/91  08/06/20 134/88   Lab Results  Component Value Date   CREATININE 0.70 08/06/2020   CREATININE 0.65 03/12/2020   CREATININE 0.94 01/30/2020   Assessment/Plan:   1. Essential hypertension Ann Woods will continue her medications, and will continue working on healthy weight loss and exercise to improve blood pressure control.  2. Class 3 severe obesity with serious comorbidity and body mass index (BMI) of 60.0 to 69.9 in adult, unspecified obesity type Ann Woods) Ann Woods is currently in the action stage of change. As such, her goal is to continue with weight loss efforts. She has agreed to keeping a food journal and adhering to recommended goals of 1600-1900 calories and 90 grams of protein daily.   We discussed adding a protein  bar or shake daily. She will also look for lower sodium, protein rich Vegan foods.   Exercise goals: No exercise has been prescribed at this time.  Behavioral modification strategies: increasing lean protein intake and decreasing sodium intake.  Ann Woods has agreed to follow-up with our clinic in 4 weeks. She was informed of the importance of frequent follow-up visits to maximize her success with intensive lifestyle modifications for her multiple health conditions.   Objective:   Blood pressure 138/64, pulse 78, temperature 98.2 F (36.8 C), height 5\' 5"  (1.651 m), weight (!) 361 lb (163.7 kg), SpO2 99 %. Body mass index is 60.07 kg/m.  General: Cooperative, alert, well developed, in no acute distress. HEENT: Conjunctivae and lids unremarkable. Cardiovascular: Regular rhythm.  Lungs: Normal work of breathing. Neurologic: No focal deficits.   Lab Results  Component Value Date   CREATININE 0.70 08/06/2020   BUN 6 08/06/2020   NA 143 08/06/2020   K 4.2 08/06/2020   CL 104 08/06/2020   CO2 26 08/06/2020   Lab Results  Component Value Date   ALT 16 08/06/2020   AST 19 08/06/2020   GGT 28 06/17/2018   ALKPHOS 111 08/06/2020   BILITOT 0.9 08/06/2020   Lab Results  Component Value Date   HGBA1C 5.4 08/06/2020   HGBA1C 5.5 01/30/2020   HGBA1C 5.8 (H) 10/10/2019   HGBA1C 6.0 (H) 06/23/2019   HGBA1C 5.8 (H) 02/03/2019   Lab Results  Component Value Date   INSULIN 9.2 08/06/2020   INSULIN 13.2 01/30/2020   INSULIN  20.8 10/10/2019   INSULIN 16.0 06/23/2019   INSULIN 12.7 02/03/2019   Lab Results  Component Value Date   TSH 1.520 06/23/2019   Lab Results  Component Value Date   CHOL 188 08/06/2020   HDL 41 08/06/2020   LDLCALC 131 (H) 08/06/2020   TRIG 89 08/06/2020   CHOLHDL 3.3 03/02/2018   Lab Results  Component Value Date   WBC 3.4 08/10/2019   HGB 11.9 08/10/2019   HCT 36.9 08/10/2019   MCV 79 08/10/2019   PLT 239 08/10/2019   Lab Results  Component  Value Date   IRON 34 02/03/2019   TIBC 355 02/03/2019   FERRITIN 23 02/03/2019   Attestation Statements:   Reviewed by clinician on day of visit: allergies, medications, problem list, medical history, surgical history, family history, social history, and previous encounter notes.   Wilhemena Durie, am acting as Location manager for [employer], FNP-C.  I have reviewed the above documentation for accuracy and completeness, and I agree with the above. -  Georgianne Fick, FNP

## 2020-10-22 ENCOUNTER — Encounter (INDEPENDENT_AMBULATORY_CARE_PROVIDER_SITE_OTHER): Payer: Self-pay | Admitting: Family Medicine

## 2020-10-22 ENCOUNTER — Other Ambulatory Visit: Payer: Self-pay

## 2020-10-22 ENCOUNTER — Ambulatory Visit (INDEPENDENT_AMBULATORY_CARE_PROVIDER_SITE_OTHER): Payer: Managed Care, Other (non HMO) | Admitting: Family Medicine

## 2020-10-22 VITALS — BP 149/81 | HR 81 | Temp 97.3°F | Ht 65.0 in | Wt 359.0 lb

## 2020-10-22 DIAGNOSIS — Z6841 Body Mass Index (BMI) 40.0 and over, adult: Secondary | ICD-10-CM

## 2020-10-22 DIAGNOSIS — I10 Essential (primary) hypertension: Secondary | ICD-10-CM | POA: Diagnosis not present

## 2020-10-22 NOTE — Progress Notes (Signed)
The 10-year ASCVD risk score [name] [name]., et al., 2013) is: 7.4%   Values used to calculate the score:     Age: 45 years     Sex: Female     Is Non-Hispanic African American: Yes     Diabetic: No     Tobacco smoker: No     Systolic Blood Pressure: 171 mmHg     Is BP treated: Yes     HDL Cholesterol: 41 mg/dL     Total Cholesterol: 188 mg/dL

## 2020-10-22 NOTE — Telephone Encounter (Signed)
fyi

## 2020-10-23 ENCOUNTER — Encounter (INDEPENDENT_AMBULATORY_CARE_PROVIDER_SITE_OTHER): Payer: Self-pay | Admitting: Family Medicine

## 2020-10-23 NOTE — Progress Notes (Signed)
Chief Complaint:   OBESITY Ann Woods is here to discuss her progress with her obesity treatment plan along with follow-up of her obesity related diagnoses. Ann Woods is on keeping a food journal and adhering to recommended goals of 1600-1900 calories and 90 grams of protein daily and states she is following her eating plan approximately 50% of the time. Ann Woods states she is doing 0 minutes 0 times per week.  Today's visit was #: 61 Starting weight: 403 lbs Starting date: 08/31/2018 Today's weight: 359 lbs Today's date: 10/22/2020 Total lbs lost to date: 44 Total lbs lost since last in-office visit: 2  Interim History: Ann Woods is doing slightly better with protein intake (averaging 70 grams per day). She is averaging 1200 calories per day. She skips lunch most days. She denies excessive hunger. She has a Vegan shake in the morning. She is vegan. Her usual pattern and is to skip meals, take in too few calories and too little protein.   Subjective:   1. Essential hypertension Ann Woods's blood pressure is elevated today. She is compliant with Lotrel. She is trying to limit sodium.  BP Readings from Last 3 Encounters:  10/22/20 (!) 149/81  09/27/20 138/64  09/03/20 (!) 152/91   Lab Results  Component Value Date   CREATININE 0.70 08/06/2020   CREATININE 0.65 03/12/2020   CREATININE 0.94 01/30/2020   Assessment/Plan:   1. Essential hypertension Ann Woods will continue working on healthy weight loss and exercise, and continue to limit sodium to improve blood pressure control. Continue Lotrel.  2. Class 3 severe obesity with serious comorbidity and body mass index (BMI) of 50.0 to 59.9 in adult, unspecified obesity type [facility]) Ann Woods is currently in the action stage of change. As such, her goal is to continue with weight loss efforts. She has agreed to keeping a food journal and adhering to recommended goals of 1600-1900 calories and 90 grams of protein daily.   Exercise goals: No  exercise has been prescribed at this time.  Behavioral modification strategies: increasing lean protein intake.  Ann Woods has agreed to follow-up with our clinic in 4 weeks.   Objective:   Blood pressure (!) 149/81, pulse 81, temperature (!) 97.3 F (36.3 C), height 5\' 5"  (1.651 m), weight (!) 359 lb (162.8 kg), SpO2 100 %. Body mass index is 59.74 kg/m.  General: Cooperative, alert, well developed, in no acute distress. HEENT: Conjunctivae and lids unremarkable. Cardiovascular: Regular rhythm.  Lungs: Normal work of breathing. Neurologic: No focal deficits.   Lab Results  Component Value Date   CREATININE 0.70 08/06/2020   BUN 6 08/06/2020   NA 143 08/06/2020   K 4.2 08/06/2020   CL 104 08/06/2020   CO2 26 08/06/2020   Lab Results  Component Value Date   ALT 16 08/06/2020   AST 19 08/06/2020   GGT 28 06/17/2018   ALKPHOS 111 08/06/2020   BILITOT 0.9 08/06/2020   Lab Results  Component Value Date   HGBA1C 5.4 08/06/2020   HGBA1C 5.5 01/30/2020   HGBA1C 5.8 (H) 10/10/2019   HGBA1C 6.0 (H) 06/23/2019   HGBA1C 5.8 (H) 02/03/2019   Lab Results  Component Value Date   INSULIN 9.2 08/06/2020   INSULIN 13.2 01/30/2020   INSULIN 20.8 10/10/2019   INSULIN 16.0 06/23/2019   INSULIN 12.7 02/03/2019   Lab Results  Component Value Date   TSH 1.520 06/23/2019   Lab Results  Component Value Date   CHOL 188 08/06/2020   HDL 41 08/06/2020  LDLCALC 131 (H) 08/06/2020   TRIG 89 08/06/2020   CHOLHDL 3.3 03/02/2018   Lab Results  Component Value Date   WBC 3.4 08/10/2019   HGB 11.9 08/10/2019   HCT 36.9 08/10/2019   MCV 79 08/10/2019   PLT 239 08/10/2019   Lab Results  Component Value Date   IRON 34 02/03/2019   TIBC 355 02/03/2019   FERRITIN 23 02/03/2019   Attestation Statements:   Reviewed by clinician on day of visit: allergies, medications, problem list, medical history, surgical history, family history, social history, and previous encounter  notes.   Wilhemena Durie, am acting as Location manager for [employer], FNP-C.  I have reviewed the above documentation for accuracy and completeness, and I agree with the above. -  Georgianne Fick, FNP

## 2020-11-15 NOTE — Patient Instructions (Addendum)
Please continue using your CPAP regularly. While your insurance requires that you use CPAP at least 4 hours each night on 70% of the nights, I recommend, that you not skip any nights and use it throughout the night if you can. Getting used to CPAP and staying with the treatment long term does take time and patience and discipline. Untreated obstructive sleep apnea when it is moderate to severe can have an adverse impact on cardiovascular health and raise her risk for heart disease, arrhythmias, hypertension, congestive heart failure, stroke and diabetes. Untreated obstructive sleep apnea causes sleep disruption, nonrestorative sleep, and sleep deprivation. This can have an impact on your day to day functioning and cause daytime sleepiness and impairment of cognitive function, memory loss, mood disturbance, and problems focussing. Using CPAP regularly can improve these symptoms.  Continue working on compliance. I am very proud of you. Let me know if you have any concerns. Follow up in 6 months    CPAP and BPAP Information CPAP and BPAP are methods that use air pressure to keep your airways open and to help you breathe well. CPAP and BPAP use different amounts of pressure. Your health care provider will tell you whether CPAP or BPAP would be more helpful for you.  CPAP stands for "continuous positive airway pressure." With CPAP, the amount of pressure stays the same while you breathe in and out.  BPAP stands for "bi-level positive airway pressure." With BPAP, the amount of pressure will be higher when you breathe in (inhale) and lower when you breathe out(exhale). This allows you to take larger breaths. CPAP or BPAP may be used in the hospital, or your health care provider may want you to use it at home. You may need to have a sleep study before your health care provider can order a machine for you to use at home. Why are CPAP and BPAP treatments used? CPAP or BPAP can be helpful if you have:  Sleep  apnea.  Chronic obstructive pulmonary disease (COPD).  Heart failure.  Medical conditions that cause muscle weakness, including muscular dystrophy or amyotrophic lateral sclerosis (ALS).  Other problems that cause breathing to be shallow, weak, abnormal, or difficult. CPAP and BPAP are most commonly used for obstructive sleep apnea (OSA) to keep the airways from collapsing when the muscles relax during sleep. How is CPAP or BPAP administered? Both CPAP and BPAP are provided by a small machine with a flexible plastic tube that attaches to a plastic mask that you wear. Air is blown through the mask into your nose or mouth. The amount of pressure that is used to blow the air can be adjusted on the machine. Your health care provider will set the pressure setting and help you find the best mask for you. When should CPAP or BPAP be used? In most cases, the mask only needs to be worn during sleep. Generally, the mask needs to be worn throughout the night and during any daytime naps. People with certain medical conditions may also need to wear the mask at other times when they are awake. Follow instructions from your health care provider about when to use the machine. What are some tips for using the mask?  Because the mask needs to be snug, some people feel trapped or closed-in (claustrophobic) when first using the mask. If you feel this way, you may need to get used to the mask. One way to do this is to hold the mask loosely over your nose or mouth and  then gradually apply the mask more snugly. You can also gradually increase the amount of time that you use the mask.  Masks are available in various types and sizes. If your mask does not fit well, talk with your health care provider about getting a different one. Some common types of masks include: ? Full face masks, which fit over the mouth and nose. ? Nasal masks, which fit over the nose. ? Nasal pillow or prong masks, which fit into the  nostrils.  If you are using a mask that fits over your nose and you tend to breathe through your mouth, a chin strap may be applied to help keep your mouth closed.  Some CPAP and BPAP machines have alarms that may sound if the mask comes off or develops a leak.  If you have trouble with the mask, it is very important that you talk with your health care provider about finding a way to make the mask easier to tolerate. Do not stop using the mask. There could be a negative impact to your health if you stop using the mask.   What are some tips for using the machine?  Place your CPAP or BPAP machine on a secure table or stand near an electrical outlet.  Know where the on/off switch is on the machine.  Follow instructions from your health care provider about how to set the pressure on your machine and when you should use it.  Do not eat or drink while the CPAP or BPAP machine is on. Food or fluids could get pushed into your lungs by the pressure of the CPAP or BPAP.  For home use, CPAP and BPAP machines can be rented or purchased through home health care companies. Many different brands of machines are available. Renting a machine before purchasing may help you find out which particular machine works well for you. Your insurance may also decide which machine you may get.  Keep the CPAP or BPAP machine and attachments clean. Ask your health care provider for specific instructions. Follow these instructions at home:  Do not use any products that contain nicotine or tobacco, such as cigarettes, e-cigarettes, and chewing tobacco. If you need help quitting, ask your health care provider.  Keep all follow-up visits as told by your health care provider. This is important. Contact a health care provider if:  You have redness or pressure sores on your head, face, mouth, or nose from the mask or head gear.  You have trouble using the CPAP or BPAP machine.  You cannot tolerate wearing the CPAP or BPAP  mask.  Someone tells you that you snore even when wearing your CPAP or BPAP. Get help right away if:  You have trouble breathing.  You feel confused. Summary  CPAP and BPAP are methods that use air pressure to keep your airways open and to help you breathe well.  You may need to have a sleep study before your health care provider can order a machine for home use.  If you have trouble with the mask, it is very important that you talk with your health care provider about finding a way to make the mask easier to tolerate. Do not stop using the mask. There could be a negative impact to your health if you stop using the mask.  Follow instructions from your health care provider about when to use the machine. This information is not intended to replace advice given to you by your health care provider. Make sure  you discuss any questions you have with your health care provider. Document Revised: 09/02/2019 Document Reviewed: 09/05/2019 Elsevier Patient Education  2021 [employer].

## 2020-11-15 NOTE — Progress Notes (Addendum)
PATIENT: Ann Woods DOB: 1976-07-28  REASON FOR VISIT: follow up HISTORY FROM: patient  Chief Complaint  Patient presents with  . Follow-up    RM 2 Alone Pt is well, CPAP does help      HISTORY OF PRESENT ILLNESS: 11/19/20 ALL:  She returns for CPAP follow up. She is doing well. She has improved compliance and feels she is sleeping better. She has noted air leaking around the top of her full face mask but continues to adjust at home. Leak has been acceptable. She denies concerns today.     05/24/2020 ALL:  Ann Woods is a 45 y.o. female here today for follow up for OSA on CPAP. She is doing better with CPAP therapy. She continues to have difficulty remembering to start therapy before going to sleep. She often drifts off to sleep while looking at her phone. She does recognize benefit of CPAP therapy. She has changed to a full face mask and feels it is a better fit for her.   Compliance report dated 04/23/2020 through 05/22/2020 reveals that she used CPAP 20 the past 30 days for compliance of 67%.  She used CPAP greater than 4 hours 16 of the past 30 days for compliance of 53%  Average usage on days used was 4 hours and 49 minutes.  Residual AHI was 2.0 on 7 to 14 cm of water and an EPR of 3.  There was a leak noted in the 95th percentile of 23 L/min.  HISTORY: (copied from Dr Guadelupe Sabin note on 03/06/2020)  Ann Woods is a 45 year old right-handed woman with an underlying medical history of prediabetes, hypertension, hyperlipidemia, hepatitis B, anemia, seasonal allergies, lower extremity swelling and morbid obesity with a BMI of over 60, who Presents for follow-up consultation of his severe obstructive sleep apnea after interim home sleep testing and starting AutoPap therapy. The patient is unaccompanied today. I first met her on 11/10/2019 at the request of her primary care, at which time she reported snoring and excessive daytime somnolence. She was advised to proceed with  sleep study testing. She had a home sleep test on 12/12/2019 which indicated severe obstructive sleep apnea with an AHI of 86.6/h, O2 nadir of 58%. She was advised to start AutoPap therapy.   Today, 02/20/2020: I reviewed her AutoPap compliance data from 01/17/2020 through 02/15/2020, which is a total of 30 days, during which time she used her machine only 16 days with percent use days greater than 4 hours at 13% only, indicating low compliance with an average usage of 3 hours and 10 minutes, residual AHI 2/h, 95th percentile pressure of 13 cm, range of 7 to 14 cm with EPR of 3, leak on the higher side with a 95th percentile at 17.9 L/min. Her set up date was 01/03/20. She reports that she is still adjusting to treatment.  Her biggest challenge is to remember to put the machine on.  She has woken up and had pulled the mask off inadvertently as well.  She has noticed already some improvements including less daytime somnolence, less nocturia and less headaches when she uses the machine and in the past few days she has used it more consistently.  She tracks her usage through the app as well.  She is motivated to continue.  She is working on weight loss and is followed by the healthy weight and wellness clinic through [facility].  The patient's allergies, current medications, family history, past medical history, past social history,  past surgical history and problem list were reviewed and updated as appropriate.   Previously:   11/10/19: (She) reports snoring and excessive daytime somnolence. I reviewed the office note from 10/31/2019. Her Epworth sleepiness score is 15/24,fatigue severity score is 41/63. She has had occasional AM headaches. She has nocturia about 1/night. Bedtime is around 10, but she has difficulty falling asleep, may be awake till 1 AM. Rise time is 5:45 AM. She lives with her BF and 2 kids, ages 59 and 42. They have 1 dog in the the home. TV in the bedroom tends to stay on at night. She is a  NS and does not drink alcohol, no daily caffeine. 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   REVIEW OF SYSTEMS: Out of a complete 14 system review of symptoms, the patient complains only of the following symptoms, fatigue and all other reviewed systems are negative.  ESS:14 FSS: 29  ALLERGIES: No Known Allergies  HOME MEDICATIONS: Outpatient Medications Prior to Visit  Medication Sig Dispense Refill  . albuterol (VENTOLIN HFA) 108 (90 Base) MCG/ACT inhaler Inhale 2 puffs into the lungs every 6 (six) hours as needed for wheezing or shortness of breath. 18 g 0  . amLODipine-benazepril (LOTREL) 5-10 MG capsule Take 1 capsule by mouth daily. 30 capsule 0  . azelastine (ASTELIN) 0.1 % nasal spray Use 1 spray in each nostril twice daily 30 mL 5  . azelastine (OPTIVAR) 0.05 % ophthalmic solution Use 1 drop in each eye twice daily as needed 6 mL 5  . cetirizine (ZYRTEC ALLERGY) 10 MG tablet Take 1 tablet (10 mg total) by mouth 2 (two) times daily. 60 tablet 5  . famotidine (PEPCID) 20 MG tablet Take 1 tablet (20 mg total) by mouth 2 (two) times daily. 60 tablet 5  . ferrous sulfate 325 (65 FE) MG tablet Take 1 tablet (325 mg total) by mouth daily with breakfast. 30 tablet 0  . levocetirizine (XYZAL) 5 MG tablet Take 5 mg by mouth every evening.    . methocarbamol (ROBAXIN) 500 MG tablet Take 1 tablet (500 mg total) by mouth 4 (four) times daily. 30 tablet 0  . montelukast (SINGULAIR) 10 MG tablet Take 1 tablet (10 mg total) by mouth at bedtime. 30 tablet 5  . Olopatadine HCl (PAZEO) 0.7 % SOLN Place 1 drop into both eyes daily. 2.5 mL 5  . [name] (CYANOCOBALAMIN) 500 MCG tablet Take 500 mcg by mouth daily.    . Vitamin D, Ergocalciferol, (DRISDOL) 1.25 MG (50000 UNIT) CAPS capsule Take 1 capsule (50,000 Units total) by mouth every 3 (three) days. 10 capsule 0  . zinc gluconate 50 MG tablet Take 50 mg by mouth daily.     No facility-administered medications prior to visit.     PAST MEDICAL HISTORY: Past Medical History:  Diagnosis Date  . Anemia   . Hepatitis B   . High cholesterol   . Hypertension    G2 - preeclampsia, CHTN  . Infertility, female   . Lactose intolerance   . Obesity   . Obesity   . Prediabetes 04/01/2018  . Seasonal allergies   . Shortness of breath   . Swelling of both lower extremities   . Urticaria     PAST SURGICAL HISTORY: Past Surgical History:  Procedure Laterality Date  . CESAREAN SECTION    . HIP SURGERY    . TUBAL LIGATION Bilateral 05/05/2014   Procedure: POST PARTUM TUBAL LIGATION;  Surgeon: Donnamae Jude,  MD;  Location: [facility];  Service: Gynecology;  Laterality: Bilateral;    FAMILY HISTORY: Family History  Problem Relation Age of Onset  . Heart disease Mother   . Thyroid disease Mother   . High blood pressure Mother   . Obesity Mother   . COPD Father   . Allergic rhinitis Neg Hx   . Angioedema Neg Hx   . Asthma Neg Hx   . Eczema Neg Hx   . Immunodeficiency Neg Hx   . Urticaria Neg Hx     SOCIAL HISTORY: Social History   Socioeconomic History  . Marital status: Significant Other    Spouse name: Not on file  . Number of children: Not on file  . Years of education: Not on file  . Highest education level: Not on file  Occupational History  . Occupation: Glass blower/designer  Tobacco Use  . Smoking status: Former Smoker    Years: 0.50    Types: Cigarettes    Quit date: 08/25/1993    Years since quitting: 27.2  . Smokeless tobacco: Never Used  . Tobacco comment: one pack of cigarettes weekly- 05/27/18  Vaping Use  . Vaping Use: Never used  Substance and Sexual Activity  . Alcohol use: Not Currently  . Drug use: No  . Sexual activity: Yes    Birth control/protection: None  Other Topics Concern  . Not on file  Social History Narrative  . Not on file   Social Determinants of Health   Financial Resource Strain: Not on file  Food Insecurity: Not on file  Transportation Needs: Not on file   Physical Activity: Not on file  Stress: Not on file  Social Connections: Not on file  Intimate Partner Violence: Not on file      PHYSICAL EXAM  Vitals:   11/19/20 0856  BP: (!) 143/94  Pulse: 66  Weight: (!) 369 lb (167.4 kg)  Height: 5' 5"  (1.651 m)   Body mass index is 61.4 kg/m.  Generalized: Well developed, in no acute distress  Cardiology: normal rate and rhythm, no murmur noted Respiratory: clear to auscultation bilaterally  Neurological examination  Mentation: Alert oriented to time, place, history taking. Follows all commands speech and language fluent Cranial nerve II-XII: Pupils were equal round reactive to light. Extraocular movements were full, visual field were full  Motor: The motor testing reveals 5 over 5 strength of all 4 extremities. Good symmetric motor tone is noted throughout.  Gait and station: Gait is normal.   DIAGNOSTIC DATA (LABS, IMAGING, TESTING) - I reviewed patient records, labs, notes, testing and imaging myself where available.  No flowsheet data found.   Lab Results  Component Value Date   WBC 3.4 08/10/2019   HGB 11.9 08/10/2019   HCT 36.9 08/10/2019   MCV 79 08/10/2019   PLT 239 08/10/2019      Component Value Date/Time   NA 143 08/06/2020 0950   K 4.2 08/06/2020 0950   CL 104 08/06/2020 0950   CO2 26 08/06/2020 0950   GLUCOSE 89 08/06/2020 0950   GLUCOSE 99 03/12/2020 0924   BUN 6 08/06/2020 0950   CREATININE 0.70 08/06/2020 0950   CREATININE 0.65 03/12/2020 0924   CALCIUM 9.4 08/06/2020 0950   PROT 7.2 08/06/2020 0950   ALBUMIN 3.7 (L) 08/06/2020 0950   AST 19 08/06/2020 0950   ALT 16 08/06/2020 0950   ALT 15 06/17/2018 0939   ALKPHOS 111 08/06/2020 0950   BILITOT 0.9 08/06/2020 0950   GFRNONAA  106 08/06/2020 0950   GFRNONAA 109 03/12/2020 0924   GFRAA 122 08/06/2020 0950   GFRAA 126 03/12/2020 0924   Lab Results  Component Value Date   CHOL 188 08/06/2020   HDL 41 08/06/2020   LDLCALC 131 (H) 08/06/2020    TRIG 89 08/06/2020   CHOLHDL 3.3 03/02/2018   Lab Results  Component Value Date   HGBA1C 5.4 08/06/2020   Lab Results  Component Value Date   VITAMINB12 335 10/10/2019   Lab Results  Component Value Date   TSH 1.520 06/23/2019       ASSESSMENT AND PLAN 45 y.o. year old female  has a past medical history of Anemia, Hepatitis B, High cholesterol, Hypertension, Infertility, female, Lactose intolerance, Obesity, Obesity, Prediabetes (04/01/2018), Seasonal allergies, Shortness of breath, Swelling of both lower extremities, and Urticaria. here with     ICD-10-CM   1. OSA on CPAP  G47.33 For home use only DME continuous positive airway pressure (CPAP)   Z99.89      Ann Woods continues to work on increasing CPAP compliance. Compliance now 80% daily and 63% 4 hour usage. She does note benefit with using CPAP therapy at home.  She was encouraged to use CPAP nightly and for greater than 4 hours each night.  She was advised to monitor for correct mask seal at home.  She will call me with any continued concerns.  Healthy lifestyle habits encouraged.  She will follow-up with me in 6 months, sooner if needed.  She verbalizes understanding and agreement with this plan.  Orders Placed This Encounter  Procedures  . For home use only DME continuous positive airway pressure (CPAP)    Supplies    Order Specific Question:   Length of Need    Answer:   Lifetime    Order Specific Question:   Patient has OSA or probable OSA    Answer:   Yes    Order Specific Question:   Is the patient currently using CPAP in the home    Answer:   Yes    Order Specific Question:   Settings    Answer:   Other see comments    Order Specific Question:   CPAP supplies needed    Answer:   Mask, headgear, cushions, filters, heated tubing and water chamber     No orders of the defined types were placed in this encounter.     I spent 20 minutes with the patient. 50% of this time was spent counseling and educating patient  on plan of care and medications.    Debbora Presto, FNP-C 11/19/2020, 9:21 AM [facility] [address] [place], [place] [address] [phone]  I reviewed the above note and documentation by the Nurse Practitioner and agree with the history, exam, assessment and plan as outlined above. I was available for consultation. Star Age, MD, PhD [facility])

## 2020-11-19 ENCOUNTER — Ambulatory Visit (INDEPENDENT_AMBULATORY_CARE_PROVIDER_SITE_OTHER): Payer: Managed Care, Other (non HMO) | Admitting: Family Medicine

## 2020-11-19 ENCOUNTER — Encounter: Payer: Self-pay | Admitting: Family Medicine

## 2020-11-19 VITALS — BP 143/94 | HR 66 | Ht 65.0 in | Wt 369.0 lb

## 2020-11-19 DIAGNOSIS — G4733 Obstructive sleep apnea (adult) (pediatric): Secondary | ICD-10-CM

## 2020-11-19 DIAGNOSIS — Z9989 Dependence on other enabling machines and devices: Secondary | ICD-10-CM | POA: Diagnosis not present

## 2020-11-26 ENCOUNTER — Ambulatory Visit (INDEPENDENT_AMBULATORY_CARE_PROVIDER_SITE_OTHER): Payer: Managed Care, Other (non HMO) | Admitting: Family Medicine

## 2020-11-26 ENCOUNTER — Encounter (INDEPENDENT_AMBULATORY_CARE_PROVIDER_SITE_OTHER): Payer: Self-pay | Admitting: Family Medicine

## 2020-11-26 ENCOUNTER — Other Ambulatory Visit: Payer: Self-pay

## 2020-11-26 VITALS — BP 134/83 | HR 70 | Temp 98.2°F | Ht 65.0 in | Wt 363.0 lb

## 2020-11-26 DIAGNOSIS — Z6841 Body Mass Index (BMI) 40.0 and over, adult: Secondary | ICD-10-CM

## 2020-11-26 DIAGNOSIS — R7303 Prediabetes: Secondary | ICD-10-CM

## 2020-11-26 DIAGNOSIS — E559 Vitamin D deficiency, unspecified: Secondary | ICD-10-CM

## 2020-12-01 NOTE — Progress Notes (Signed)
Chief Complaint:   OBESITY Ann Woods is here to discuss her progress with her obesity treatment plan along with follow-up of her obesity related diagnoses. Ann Woods is on keeping a food journal and adhering to recommended goals of 1600-1900 calories and 90 g protein and states she is following her eating plan approximately 50% of the time. [name] states she is not currently exercising.  Today's visit was #: 70 Starting weight: 403 lbs Starting date: 08/31/2018 Today's weight: 363 lbs Today's date: 11/26/2020 Total lbs lost to date: 40 lbs Total lbs lost since last in-office visit: 0  Interim History: The last few weeks, pt has been eating and trying to get protein goal. The last week, pt stopped to actually journal. Normally getting ~1600 cal/day and ~60 g protein per day. The next few weeks all pt has planned is work.  Subjective:   1. Pre-diabetes [name]'s last A1c was 5.4 and insulin 9.2. She is not on medication.  2. Vitamin D deficiency Ann Woods is on prescription Vit D. Her last Vit D level was 36.7.  Assessment/Plan:   1. Pre-diabetes Ann Woods will continue to work on weight loss, exercise, and decreasing simple carbohydrates to help decrease the risk of diabetes. Repeat labs in 2 months.  2. Vitamin D deficiency Low Vitamin D level contributes to fatigue and are associated with obesity, breast, and colon cancer. She agrees to continue to take prescription Vitamin D @50 ,000 IU every week and will follow-up for routine testing of Vitamin D, at least 2-3 times per year to avoid over-replacement.  3. Class 3 severe obesity with serious comorbidity and body mass index (BMI) of 60.0 to 69.9 in adult, unspecified obesity type [facility]) Ann Woods is currently in the action stage of change. As such, her goal is to continue with weight loss efforts. She has agreed to keeping a food journal and adhering to recommended goals of 1800-2000 calories and 90+ protein.   Discussed starting to  incorporate protein powder and bars to increase protein in a calorie controlled way.  Exercise goals: No exercise has been prescribed at this time.  Behavioral modification strategies: increasing lean protein intake, planning for success and keeping a strict food journal.  Ann Woods has agreed to follow-up with our clinic in 4 weeks. She was informed of the importance of frequent follow-up visits to maximize her success with intensive lifestyle modifications for her multiple health conditions.   Objective:   Blood pressure 134/83, pulse 70, temperature 98.2 F (36.8 C), height 5\' 5"  (1.651 m), weight (!) 363 lb (164.7 kg), SpO2 99 %. Body mass index is 60.41 kg/m.  General: Cooperative, alert, well developed, in no acute distress. HEENT: Conjunctivae and lids unremarkable. Cardiovascular: Regular rhythm.  Lungs: Normal work of breathing. Neurologic: No focal deficits.   Lab Results  Component Value Date   CREATININE 0.70 08/06/2020   BUN 6 08/06/2020   NA 143 08/06/2020   K 4.2 08/06/2020   CL 104 08/06/2020   CO2 26 08/06/2020   Lab Results  Component Value Date   ALT 16 08/06/2020   AST 19 08/06/2020   GGT 28 06/17/2018   ALKPHOS 111 08/06/2020   BILITOT 0.9 08/06/2020   Lab Results  Component Value Date   HGBA1C 5.4 08/06/2020   HGBA1C 5.5 01/30/2020   HGBA1C 5.8 (H) 10/10/2019   HGBA1C 6.0 (H) 06/23/2019   HGBA1C 5.8 (H) 02/03/2019   Lab Results  Component Value Date   INSULIN 9.2 08/06/2020   INSULIN 13.2 01/30/2020  INSULIN 20.8 10/10/2019   INSULIN 16.0 06/23/2019   INSULIN 12.7 02/03/2019   Lab Results  Component Value Date   TSH 1.520 06/23/2019   Lab Results  Component Value Date   CHOL 188 08/06/2020   HDL 41 08/06/2020   LDLCALC 131 (H) 08/06/2020   TRIG 89 08/06/2020   CHOLHDL 3.3 03/02/2018   Lab Results  Component Value Date   WBC 3.4 08/10/2019   HGB 11.9 08/10/2019   HCT 36.9 08/10/2019   MCV 79 08/10/2019   PLT 239 08/10/2019    Lab Results  Component Value Date   IRON 34 02/03/2019   TIBC 355 02/03/2019   FERRITIN 23 02/03/2019   Attestation Statements:   Reviewed by clinician on day of visit: allergies, medications, problem list, medical history, surgical history, family history, social history, and previous encounter notes.  Time spent on visit including pre-visit chart review and post-visit care and charting was 15 minutes.   Coral Ceo, am acting as transcriptionist for Coralie Common, MD.   I have reviewed the above documentation for accuracy and completeness, and I agree with the above. - Jinny Blossom, MD

## 2020-12-31 ENCOUNTER — Other Ambulatory Visit: Payer: Self-pay

## 2020-12-31 ENCOUNTER — Encounter (INDEPENDENT_AMBULATORY_CARE_PROVIDER_SITE_OTHER): Payer: Self-pay | Admitting: Family Medicine

## 2020-12-31 ENCOUNTER — Ambulatory Visit (INDEPENDENT_AMBULATORY_CARE_PROVIDER_SITE_OTHER): Payer: Managed Care, Other (non HMO) | Admitting: Family Medicine

## 2020-12-31 VITALS — BP 142/74 | HR 77 | Temp 97.9°F | Ht 65.0 in | Wt 359.0 lb

## 2020-12-31 DIAGNOSIS — Z9189 Other specified personal risk factors, not elsewhere classified: Secondary | ICD-10-CM

## 2020-12-31 DIAGNOSIS — Z6841 Body Mass Index (BMI) 40.0 and over, adult: Secondary | ICD-10-CM

## 2020-12-31 DIAGNOSIS — I10 Essential (primary) hypertension: Secondary | ICD-10-CM

## 2020-12-31 DIAGNOSIS — E559 Vitamin D deficiency, unspecified: Secondary | ICD-10-CM | POA: Diagnosis not present

## 2020-12-31 MED ORDER — VITAMIN D (ERGOCALCIFEROL) 1.25 MG (50000 UNIT) PO CAPS: 50000.0000 [IU] | ORAL_CAPSULE | ORAL | 0 refills | Status: DC

## 2020-12-31 NOTE — Progress Notes (Signed)
Chief Complaint:   OBESITY Ann Woods is here to discuss her progress with her obesity treatment plan along with follow-up of her obesity related diagnoses. Ann Woods is on keeping a food journal and adhering to recommended goals of 1800-2000 calories and 90+ grams of protein daily and states she is following her eating plan approximately 0% of the time. Ann Woods states she is doing 0 minutes 0 times per week.  Today's visit was #: 63 Starting weight: 403 lbs Starting date: 08/31/2018 Today's weight: 359 lbs Today's date: 12/31/2020 Total lbs lost to date: 44 Total lbs lost since last in-office visit: 4  Interim History: Ann Woods has not been journaling due to being busy with work. Her kids have also been sick. She is working 50-60 hours per week. She is also working on her GED. She has been working on increasing protein and decreasing carbohydrates. She is down another 4 lbs today. She feels she is getting adequate protein.   Subjective:   1. Essential hypertension Ann Woods's blood pressure is slightly elevated today. She did not take her blood pressure medication today.  BP Readings from Last 3 Encounters:  12/31/20 (!) 142/74  11/26/20 134/83  11/19/20 (!) 143/94   Lab Results  Component Value Date   CREATININE 0.70 08/06/2020   CREATININE 0.65 03/12/2020   CREATININE 0.94 01/30/2020   2. Vitamin D deficiency Ann Woods's last Vit D level was low at 36.7. She is on prescription Vit D once every 3 days.  3. At risk for medication nonadherence Ann Woods is at a higher than average risk for medication non-adherence due to unintentional non compliance with anti-hypertensives.  Assessment/Plan:   1. Essential hypertension Ann Woods will take her blood pressure medication when she gets to her car.   2. Vitamin D deficiency  We will refill prescription Vitamin D for 90 days with no refills. We will recheck her Vit D level at her next office visit. Ann Woods will follow-up for routine  testing of Vitamin D, at least 2-3 times per year to avoid over-replacement.  - Vitamin D, Ergocalciferol, (DRISDOL) 1.25 MG (50000 UNIT) CAPS capsule; Take 1 capsule (50,000 Units total) by mouth every 3 (three) days.  Dispense: 30 capsule; Refill: 0  3. At risk for medication nonadherence Ann Woods was given approximately 15 minutes of counseling today to help her avoid medication non-adherence.  We discussed importance of taking medications at a similar time each day and the use of daily pill organizers to help improve medication adherence.  Repetitive spaced learning was employed today to elicit superior memory formation and behavioral change.  4. Obesity: Current BMI 60.41 Ann Woods is currently in the action stage of change. As such, her goal is to continue with weight loss efforts. She has agreed to keeping a food journal and adhering to recommended goals of 1800-2000 calories and 90 grams of protein daily.   Ann Woods is going to restart journaling. She will work on more self-care, and try to take more days off.  Exercise goals: We discussed starting exercise.  Behavioral modification strategies: increasing lean protein intake, decreasing simple carbohydrates and keeping a strict food journal.  Ann Woods has agreed to follow-up with our clinic in 3 to 4 weeks.   Objective:   Blood pressure (!) 142/74, pulse 77, temperature 97.9 F (36.6 C), temperature source Oral, height 5\' 5"  (1.651 m), weight (!) 359 lb (162.8 kg), SpO2 98 %. Body mass index is 59.74 kg/m.  General: Cooperative, alert, well developed, in no acute distress. HEENT: Conjunctivae  and lids unremarkable. Cardiovascular: Regular rhythm.  Lungs: Normal work of breathing. Neurologic: No focal deficits.   Lab Results  Component Value Date   CREATININE 0.70 08/06/2020   BUN 6 08/06/2020   NA 143 08/06/2020   K 4.2 08/06/2020   CL 104 08/06/2020   CO2 26 08/06/2020   Lab Results  Component Value Date   ALT 16  08/06/2020   AST 19 08/06/2020   GGT 28 06/17/2018   ALKPHOS 111 08/06/2020   BILITOT 0.9 08/06/2020   Lab Results  Component Value Date   HGBA1C 5.4 08/06/2020   HGBA1C 5.5 01/30/2020   HGBA1C 5.8 (H) 10/10/2019   HGBA1C 6.0 (H) 06/23/2019   HGBA1C 5.8 (H) 02/03/2019   Lab Results  Component Value Date   INSULIN 9.2 08/06/2020   INSULIN 13.2 01/30/2020   INSULIN 20.8 10/10/2019   INSULIN 16.0 06/23/2019   INSULIN 12.7 02/03/2019   Lab Results  Component Value Date   TSH 1.520 06/23/2019   Lab Results  Component Value Date   CHOL 188 08/06/2020   HDL 41 08/06/2020   LDLCALC 131 (H) 08/06/2020   TRIG 89 08/06/2020   CHOLHDL 3.3 03/02/2018   Lab Results  Component Value Date   WBC 3.4 08/10/2019   HGB 11.9 08/10/2019   HCT 36.9 08/10/2019   MCV 79 08/10/2019   PLT 239 08/10/2019   Lab Results  Component Value Date   IRON 34 02/03/2019   TIBC 355 02/03/2019   FERRITIN 23 02/03/2019   Attestation Statements:   Reviewed by clinician on day of visit: allergies, medications, problem list, medical history, surgical history, family history, social history, and previous encounter notes.   Wilhemena Durie, am acting as Location manager for [employer], FNP-C.  I have reviewed the above documentation for accuracy and completeness, and I agree with the above. -  Georgianne Fick, FNP

## 2021-01-01 ENCOUNTER — Encounter (INDEPENDENT_AMBULATORY_CARE_PROVIDER_SITE_OTHER): Payer: Self-pay | Admitting: Family Medicine

## 2021-01-02 ENCOUNTER — Other Ambulatory Visit (INDEPENDENT_AMBULATORY_CARE_PROVIDER_SITE_OTHER): Payer: Self-pay | Admitting: Family Medicine

## 2021-01-02 DIAGNOSIS — E559 Vitamin D deficiency, unspecified: Secondary | ICD-10-CM

## 2021-01-02 NOTE — Telephone Encounter (Signed)
Pt last seen by Dawn Whitmire, FNP.  

## 2021-01-28 ENCOUNTER — Ambulatory Visit
Admission: RE | Admit: 2021-01-28 | Discharge: 2021-01-28 | Disposition: A | Discharge status: Home / Self Care | Payer: Managed Care, Other (non HMO) | Source: Ambulatory Visit | Attending: Internal Medicine | Admitting: Internal Medicine

## 2021-01-28 ENCOUNTER — Telehealth: Payer: Self-pay

## 2021-01-28 DIAGNOSIS — B181 Chronic viral hepatitis B without delta-agent: Secondary | ICD-10-CM

## 2021-01-28 DIAGNOSIS — K7400 Hepatic fibrosis, unspecified: Secondary | ICD-10-CM

## 2021-01-28 NOTE — Telephone Encounter (Signed)
-----   Message from Thayer Headings, MD sent at 01/28/2021 12:47 PM EDT ----- Please let her know her ultrasound looks fine, no new concerns  thanks ----- Message ----- From: Interface, Rad Results In Sent: 01/28/2021  11:27 AM EDT To: Thayer Headings, MD

## 2021-01-28 NOTE — Telephone Encounter (Signed)
Attempted to reach patient by phone to make aware of ultra sound results. Will send a Mychart message as well. Ann Woods

## 2021-02-07 ENCOUNTER — Ambulatory Visit (INDEPENDENT_AMBULATORY_CARE_PROVIDER_SITE_OTHER): Payer: Managed Care, Other (non HMO) | Admitting: Family Medicine

## 2021-02-08 ENCOUNTER — Other Ambulatory Visit: Payer: Self-pay

## 2021-02-08 ENCOUNTER — Ambulatory Visit (HOSPITAL_COMMUNITY)
Admission: EM | Admit: 2021-02-08 | Discharge: 2021-02-08 | Disposition: A | Discharge status: Home / Self Care | Payer: Managed Care, Other (non HMO) | Attending: Medical Oncology | Admitting: Medical Oncology

## 2021-02-08 ENCOUNTER — Ambulatory Visit (INDEPENDENT_AMBULATORY_CARE_PROVIDER_SITE_OTHER): Payer: Managed Care, Other (non HMO)

## 2021-02-08 ENCOUNTER — Encounter (HOSPITAL_COMMUNITY): Payer: Self-pay

## 2021-02-08 DIAGNOSIS — J189 Pneumonia, unspecified organism: Secondary | ICD-10-CM | POA: Diagnosis not present

## 2021-02-08 DIAGNOSIS — Z87891 Personal history of nicotine dependence: Secondary | ICD-10-CM | POA: Diagnosis not present

## 2021-02-08 DIAGNOSIS — Z6841 Body Mass Index (BMI) 40.0 and over, adult: Secondary | ICD-10-CM | POA: Insufficient documentation

## 2021-02-08 DIAGNOSIS — R059 Cough, unspecified: Secondary | ICD-10-CM

## 2021-02-08 DIAGNOSIS — G4733 Obstructive sleep apnea (adult) (pediatric): Secondary | ICD-10-CM | POA: Diagnosis not present

## 2021-02-08 DIAGNOSIS — R0602 Shortness of breath: Secondary | ICD-10-CM

## 2021-02-08 DIAGNOSIS — Z20822 Contact with and (suspected) exposure to covid-19: Secondary | ICD-10-CM | POA: Insufficient documentation

## 2021-02-08 HISTORY — DX: Allergy, unspecified, initial encounter: T78.40XA

## 2021-02-08 LAB — SARS CORONAVIRUS 2 (TAT 6-24 HRS): SARS Coronavirus 2: NEGATIVE

## 2021-02-08 MED ORDER — LIDOCAINE VISCOUS HCL 2 % MT SOLN
OROMUCOSAL | Status: AC
  Filled 2021-02-08: qty 15

## 2021-02-08 MED ORDER — ALUM & MAG HYDROXIDE-SIMETH 200-200-20 MG/5ML PO SUSP
ORAL | Status: AC
  Filled 2021-02-08: qty 30

## 2021-02-08 MED ORDER — ALUM & MAG HYDROXIDE-SIMETH 200-200-20 MG/5ML PO SUSP
30.0000 mL | Freq: Once | ORAL | Status: AC
  Administered 2021-02-08: 30 mL via ORAL

## 2021-02-08 MED ORDER — AMOXICILLIN-POT CLAVULANATE 875-125 MG PO TABS: 1.0000 | ORAL_TABLET | Freq: Two times a day (BID) | ORAL | 0 refills | Status: DC

## 2021-02-08 MED ORDER — FLUTICASONE PROPIONATE 50 MCG/ACT NA SUSP: 2.0000 | Freq: Every day | NASAL | 0 refills | Status: AC

## 2021-02-08 MED ORDER — LIDOCAINE VISCOUS HCL 2 % MT SOLN
15.0000 mL | Freq: Once | OROMUCOSAL | Status: AC
  Administered 2021-02-08: 15 mL via ORAL

## 2021-02-08 NOTE — ED Triage Notes (Signed)
Pt reports shortness of breath and epigastric pain radiating to left side starting last night. Denies chest pain. States had to prop self up to be able to sleep. Sat 97% on RA. Denies N/V.

## 2021-02-08 NOTE — ED Provider Notes (Addendum)
[place]    CSN: [id number] Arrival date & time: 02/08/21  0858      History   Chief Complaint Chief Complaint  Patient presents with   Shortness of Breath   Abdominal Pain    HPI Ann Woods is a 45 y.o. female.   HPI  SOB: Patient states that last night she started having some pain of her left upper quadrant abdomen and epigastric area.  She then developed some shortness of breath.  She took Tylenol which did not help relieve symptoms.  Since then symptoms have not fully gone away.  She states at rest her symptoms are much better but with walking they do worsen some.  She states that she has had some cough and runny nose as well.  No known sick contacts.  She denies any chest pain, vomiting, diarrhea, constipation, known fevers.  Past Medical History:  Diagnosis Date   Allergies    reports allergies to dust, pollen, grass and trees   Anemia    Hepatitis B    High cholesterol    Hypertension    G2 - preeclampsia, CHTN   Infertility, female    Lactose intolerance    Obesity    Obesity    Prediabetes 04/01/2018   Seasonal allergies    Shortness of breath    Swelling of both lower extremities    Urticaria     Patient Active Problem List   Diagnosis Date Noted   Insulin resistance 02/22/2020   OSA on CPAP 01/30/2020   Low HDL (under 40) 11/01/2019   Other hyperlipidemia 10/10/2019   B12 deficiency 10/10/2019   Hepatic steatosis 08/29/2019   Need for prophylactic vaccination and inoculation against influenza 08/29/2019   Vitamin D deficiency 10/25/2018   Essential hypertension 10/13/2018   Depression 10/13/2018   History of iron deficiency anemia 08/31/2018   Class 3 severe obesity with serious comorbidity and body mass index (BMI) of 60.0 to 69.9 in adult [facility]) 08/31/2018   Liver fibrosis 07/15/2018   Prediabetes 04/01/2018   Anemia 02/17/2018   Dyspnea on exertion 02/17/2018   Cardiomegaly 02/17/2018   Morbid obesity ([place]) 02/17/2018    Abnormal PFT 02/17/2018   Sterilization 05/05/2014   Chronic Hypertension in pregnancy 12/01/2013   Chronic viral hepatitis B without delta-agent ([place]) 12/01/2013   Allergic rhinitis 05/31/2007   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 Authorizing Provider  albuterol (VENTOLIN HFA) 108 (90 Base) MCG/ACT inhaler Inhale 2 puffs into the lungs every 6 (six) hours as needed for wheezing or shortness of breath. 07/31/20  Yes Henson, Vickie L, NP-C  amLODipine-benazepril (LOTREL) 5-10 MG capsule Take 1 capsule by mouth daily. 09/01/19  Yes Whitmire, Dawn W, FNP  azelastine (ASTELIN) 0.1 % nasal spray Use 1 spray in each nostril twice daily 01/21/19  Yes Padgett, Rae Halsted, MD  azelastine (OPTIVAR) 0.05 % ophthalmic solution Use 1 drop in each eye twice daily as needed 01/21/19  Yes Padgett, Rae Halsted, MD  cetirizine (ZYRTEC ALLERGY) 10 MG tablet Take 1 tablet (10 mg total) by mouth 2 (two) times daily. 08/10/19  Yes Padgett, Rae Halsted, MD  famotidine (PEPCID) 20 MG tablet Take 1 tablet (20 mg total) by mouth 2 (two) times daily. 08/10/19  Yes Kennith Gain, MD  ferrous sulfate 325 (65 FE) MG tablet Take 1 tablet (325 mg total) by mouth daily with breakfast. 02/11/18 08/09/28 Yes Elodia Florence., MD  methocarbamol (ROBAXIN) 500 MG tablet Take 1 tablet (500 mg total) by mouth 4  (four) times daily. 09/01/19  Yes Henson, Vickie L, NP-C  montelukast (SINGULAIR) 10 MG tablet Take 1 tablet (10 mg total) by mouth at bedtime. 03/10/19  Yes Padgett, Rae Halsted, MD  Olopatadine HCl (PAZEO) 0.7 % SOLN Place 1 drop into both eyes daily. 03/10/19  Yes Padgett, Rae Halsted, MD  [name] (CYANOCOBALAMIN) 500 MCG tablet Take 500 mcg by mouth daily.   Yes [provider]  levocetirizine (XYZAL) 5 MG tablet Take 5 mg by mouth every evening.    [provider]  Vitamin D, Ergocalciferol, (DRISDOL) 1.25 MG (50000 UNIT) CAPS capsule Take 1 capsule (50,000 Units total) by mouth every 3 (three) days. 12/31/20   Whitmire, Joneen Boers, FNP  zinc gluconate 50 MG tablet Take 50 mg by mouth daily.    [provider]    Family History Family History  Problem Relation Age of Onset   Heart disease Mother    Thyroid disease Mother    High blood pressure Mother    Obesity Mother    COPD Father    Allergic rhinitis Neg Hx    Angioedema Neg Hx    Asthma Neg Hx    Eczema Neg Hx    Immunodeficiency Neg Hx    Urticaria Neg Hx     Social History Social History   Tobacco Use   Smoking status: Former    Years: 0.50    Pack years: 0.00    Types: Cigarettes    Quit date: 08/25/1993    Years since quitting: 27.4   Smokeless tobacco: Never   Tobacco comments:    one pack of cigarettes weekly- 05/27/18  Vaping Use   Vaping Use: Never used  Substance Use Topics   Alcohol use: Not Currently   Drug use: No     Allergies   Patient has no known allergies.   Review of Systems Review of Systems  As stated above in HPI Physical Exam Triage Vital Signs ED Triage Vitals  Enc Vitals Group     BP 02/08/21 0923 (!) 144/91     Pulse Rate 02/08/21 0923 92     Resp 02/08/21 0923 18     Temp 02/08/21 0923 99.5 F (37.5 C)     Temp src --      SpO2 02/08/21 0923 96 %     Weight --      Height --      Head Circumference --      Peak Flow --      Pain Score  02/08/21 0917 7  Pain Loc --      Pain Edu? --      Excl. in [place]? --    No data found.  Updated Vital Signs BP (!) 144/91   Pulse 92   Temp 99.5 F (37.5 C)   Resp 18   LMP 12/09/2020 (Approximate) Comment: pt reports irregular periods, has not had period in about 2 months.  SpO2 96%   Physical Exam Vitals and nursing note reviewed.  Constitutional:      General: She is not in acute distress.    Appearance: She is not ill-appearing, toxic-appearing or diaphoretic.     Comments: Speaking in full sentences  HENT:     Head: Normocephalic and atraumatic.     Comments: Clear nasal drainage    Mouth/Throat:     Mouth: Mucous membranes are moist.     Pharynx: Oropharynx is clear.  Eyes:     Extraocular Movements: Extraocular movements intact.     Pupils: Pupils are equal, round, and reactive to light.  Cardiovascular:     Rate and Rhythm: Normal rate and regular rhythm.  Pulmonary:     Effort: Pulmonary effort is normal.     Breath sounds: Normal breath sounds. No decreased breath sounds, wheezing, rhonchi or rales.  Chest:     Chest wall: No tenderness, crepitus or edema.  Abdominal:     General: Bowel sounds are normal.     Palpations: Abdomen is soft. There is no hepatomegaly or splenomegaly.     Tenderness: There is no abdominal tenderness. There is no guarding or rebound.  Musculoskeletal:     Cervical back: Normal range of motion and neck supple.  Lymphadenopathy:     Cervical: No cervical adenopathy.  Skin:    General: Skin is warm.     Coloration: Skin is not cyanotic or pale.  Neurological:     Mental Status: She is oriented to person, place, and time.  Psychiatric:        Mood and Affect: Mood normal.        Behavior: Behavior normal.     UC Treatments / Results  Labs (all labs ordered are listed, but only abnormal results are displayed) Labs Reviewed - No data to display  EKG   Radiology No results found.  Procedures Procedures (including  critical care time)  Medications Ordered in UC Medications  alum & mag hydroxide-simeth (MAALOX/MYLANTA) 200-200-20 MG/5ML suspension 30 mL (has no administration in time range)    And  lidocaine (XYLOCAINE) 2 % viscous mouth solution 15 mL (has no administration in time range)    Initial Impression / Assessment and Plan / UC Course  I have reviewed the triage vital signs and the nursing notes.  Pertinent labs & imaging results that were available during my care of the patient were reviewed by me and considered in my medical decision making (see chart for details).     New.  We will swab for COVID, her EKG is reassuring, chest x-ray pending, trial of GI cocktail.   UPDATE: chest x ray shows pneumonia. Treating for CAP with Augmentin to prevent further systemic complications. Discussed including how to take along with common potential side effects and precuations. Red flag signs and symptoms discussed. 4-6 day follow up in office and 1 month x ray follow up Final Clinical Impressions(s) / UC Diagnoses   Final diagnoses:  None   Discharge Instructions   None    ED Prescriptions   None    PDMP  not reviewed this encounter.   Hughie Closs, PA-C 02/08/21 1055    [address], PA-C 02/08/21 1059    Hughie Closs, [place] 02/17/21 1005

## 2021-02-11 ENCOUNTER — Other Ambulatory Visit: Payer: Self-pay

## 2021-02-11 ENCOUNTER — Telehealth (INDEPENDENT_AMBULATORY_CARE_PROVIDER_SITE_OTHER): Payer: Managed Care, Other (non HMO) | Admitting: Family Medicine

## 2021-02-11 ENCOUNTER — Encounter (INDEPENDENT_AMBULATORY_CARE_PROVIDER_SITE_OTHER): Payer: Self-pay | Admitting: Family Medicine

## 2021-02-11 DIAGNOSIS — E559 Vitamin D deficiency, unspecified: Secondary | ICD-10-CM | POA: Diagnosis not present

## 2021-02-11 DIAGNOSIS — J189 Pneumonia, unspecified organism: Secondary | ICD-10-CM | POA: Diagnosis not present

## 2021-02-11 DIAGNOSIS — Z6841 Body Mass Index (BMI) 40.0 and over, adult: Secondary | ICD-10-CM | POA: Diagnosis not present

## 2021-02-11 MED ORDER — VITAMIN D (ERGOCALCIFEROL) 1.25 MG (50000 UNIT) PO CAPS: 50000.0000 [IU] | ORAL_CAPSULE | ORAL | 0 refills | Status: DC

## 2021-02-12 ENCOUNTER — Encounter: Payer: Self-pay | Admitting: Family Medicine

## 2021-02-12 ENCOUNTER — Telehealth (INDEPENDENT_AMBULATORY_CARE_PROVIDER_SITE_OTHER): Payer: Managed Care, Other (non HMO) | Admitting: Family Medicine

## 2021-02-12 VITALS — Temp 98.3°F | Wt 362.0 lb

## 2021-02-12 DIAGNOSIS — R0781 Pleurodynia: Secondary | ICD-10-CM | POA: Diagnosis not present

## 2021-02-12 DIAGNOSIS — J189 Pneumonia, unspecified organism: Secondary | ICD-10-CM | POA: Diagnosis not present

## 2021-02-12 DIAGNOSIS — R0602 Shortness of breath: Secondary | ICD-10-CM | POA: Diagnosis not present

## 2021-02-12 NOTE — Progress Notes (Signed)
TeleHealth Visit:  Due to the COVID-19 pandemic, this visit was completed with telemedicine (audio/video) technology to reduce patient and provider exposure as well as to preserve personal protective equipment.   Ann Woods has verbally consented to this TeleHealth visit. The patient is located at home, the provider is located at the [facility] office. The participants in this visit include the listed provider and patient. The visit was conducted today via telephone.  Ann Woods was unable to use realtime audiovisual technology today and the telehealth visit was conducted via telephone.  Chief Complaint: OBESITY Ann Woods is here to discuss her progress with her obesity treatment plan along with follow-up of her obesity related diagnoses. Ann Woods is on keeping a food journal and adhering to recommended goals of 1800-1900 calories and 90+ grams of protein and states she is following her eating plan approximately 90% of the time. Ann Woods states she is not exercising at this time.  Today's visit was #: 82 Starting weight: 403 lbs Starting date: 08/31/2018  Interim History: Ann Woods has pneumonia, so we are doing a phone call today.  Unable to make video work.  She weighs 362.5 pounds at home today.  She was journaling before she got sick and protein intake was good (85 grams daily).  Calories were around 1600 per day.  Subjective:   1. Vitamin D deficiency Vitamin D not at goal (36.7 at last check).  On prescription 50,000 IU every 3 days.  2. Pneumonia due to infectious organism, unspecified laterality, unspecified part of lung She is on amoxicillin for reported left lung pneumonia.  She notes shortness of breath with speech.  Assessment/Plan:   1. Vitamin D deficiency Refill vitamin D 50,000 IU weekly today, as per below.  - Refill Vitamin D, Ergocalciferol, (DRISDOL) 1.25 MG (50000 UNIT) CAPS capsule; Take 1 capsule (50,000 Units total) by mouth every 3 (three) days. (Patient  not taking: Reported on 02/12/2021)  Dispense: 30 capsule; Refill: 0  2. Pneumonia due to infectious organism, unspecified laterality, unspecified part of lung She will follow-up with primary care on Friday.  Continue amoxicillin.  3. Obesity: Current BMI 59.74  Ann Woods is currently in the action stage of change. As such, her goal is to continue with weight loss efforts. She has agreed to keeping a food journal and adhering to recommended goals of 1800-2000 calories and 90 grams of protein.   Exercise goals: No exercise has been prescribed at this time.  Behavioral modification strategies: increasing lean protein intake.  Ann Woods has agreed to follow-up with our clinic in 3 weeks.  Objective:   VITALS: Per patient if applicable, see vitals. GENERAL: Alert and in no acute distress. CARDIOPULMONARY: No increased WOB. Speaking in clear sentences.  PSYCH: Pleasant and cooperative. Speech normal rate and rhythm. Affect is appropriate. Insight and judgement are appropriate. Attention is focused, linear, and appropriate.  NEURO: Oriented as arrived to appointment on time with no prompting.   Lab Results  Component Value Date   CREATININE 0.70 08/06/2020   BUN 6 08/06/2020   NA 143 08/06/2020   K 4.2 08/06/2020   CL 104 08/06/2020   CO2 26 08/06/2020   Lab Results  Component Value Date   ALT 16 08/06/2020   AST 19 08/06/2020   GGT 28 06/17/2018   ALKPHOS 111 08/06/2020   BILITOT 0.9 08/06/2020   Lab Results  Component Value Date   HGBA1C 5.4 08/06/2020   HGBA1C 5.5 01/30/2020   HGBA1C 5.8 (H) 10/10/2019   HGBA1C  6.0 (H) 06/23/2019   HGBA1C 5.8 (H) 02/03/2019   Lab Results  Component Value Date   INSULIN 9.2 08/06/2020   INSULIN 13.2 01/30/2020   INSULIN 20.8 10/10/2019   INSULIN 16.0 06/23/2019   INSULIN 12.7 02/03/2019   Lab Results  Component Value Date   TSH 1.520 06/23/2019   Lab Results  Component Value Date   CHOL 188 08/06/2020   HDL 41 08/06/2020    LDLCALC 131 (H) 08/06/2020   TRIG 89 08/06/2020   CHOLHDL 3.3 03/02/2018   Lab Results  Component Value Date   WBC 3.4 08/10/2019   HGB 11.9 08/10/2019   HCT 36.9 08/10/2019   MCV 79 08/10/2019   PLT 239 08/10/2019   Lab Results  Component Value Date   IRON 34 02/03/2019   TIBC 355 02/03/2019   FERRITIN 23 02/03/2019    Attestation Statements:   Reviewed by clinician on day of visit: allergies, medications, problem list, medical history, surgical history, family history, social history, and previous encounter notes.  Time spent on visit including pre-visit chart review and post-visit charting and care was 25 minutes.   I, Water quality scientist, CMA, am acting as Location manager for [employer], [place].  I have reviewed the above documentation for accuracy and completeness, and I agree with the above. - Georgianne Fick, FNP

## 2021-02-12 NOTE — Progress Notes (Signed)
   Subjective:  Documentation for virtual audio and video telecommunications through [name] encounter:  The patient was located at home. 2 patient identifiers used.  The provider was located in the office. The patient did consent to this visit and is aware of possible charges through their insurance for this visit.  The other persons participating in this telemedicine service were none. Time spent on call was 16 minutes and in review of previous records >20 minutes total.  This virtual service is not related to other E/M service within previous 7 days.   Patient ID: Ann Woods, female    DOB: 1976/08/06, 45 y.o.   MRN: [id number]  HPI Chief Complaint  Patient presents with   other    Pneumonia pain under lt. Breast, low grade fever 99.5, little SOB    States 4 days ago she was diagnosed with pneumonia   States she only felt tired initially but then later started having left lower rib pain and shortness of breath.  States she had several negative Covid tests last week.   States she ended up going to urgent care to be evaluated and was diagnosed with left lower lobe pneumonia.  She is taking Augmentin.  States she is still having some mild shortness of breath but better. 60% improved.  No longer having sharp pain. Dull pain now along the lower left ribs She is not coughing very much.  Has not needed albuterol inhaler   Drinking plenty of water.   She works at [employer]. States she was given an out of work note by urgent care but stated she needed to go back to work today.  States she does not feel that she can return to work today.  Request an out of work note from me   Review of Systems Pertinent positives and negatives in the history of present illness.     Objective:   Physical Exam Temp 98.3 F (36.8 C)   Wt (!) 362 lb (164.2 kg)   BMI 60.24 kg/m   Alert and oriented and in no acute distress.  Respirations unlabored.  She is speaking in  complete sentences without difficulty.      Assessment & Plan:  Pneumonia due to infectious organism, unspecified laterality, unspecified part of lung  Shortness of breath  Rib pain on left side  She reports feeling approximately 60% improved from 5 days ago at symptom onset and then later being diagnosed with left lower lobe pneumonia.  She is taking Augmentin and was advised to complete the antibiotic.  She is not needing her rescue inhaler.  Continue with symptomatic treatment including Mucinex.  I will provide her with an out of work note for today and tomorrow.  Advised that she would need to be seen if she is still unable to return to work after that.  Advised her to follow-up if worsening or not continuing to improve over the next week.  Discussed a repeat chest x-ray in approximately 1 to 2 months.

## 2021-02-13 ENCOUNTER — Encounter (INDEPENDENT_AMBULATORY_CARE_PROVIDER_SITE_OTHER): Payer: Self-pay | Admitting: Family Medicine

## 2021-02-15 ENCOUNTER — Inpatient Hospital Stay: Payer: Managed Care, Other (non HMO) | Admitting: Family Medicine

## 2021-02-15 ENCOUNTER — Encounter: Payer: Self-pay | Admitting: Family Medicine

## 2021-02-15 NOTE — Telephone Encounter (Signed)
Pt called and states that she was diagnosed with pneumonia and had a visit with Vickie. She states that today her chest feels really tight and she is having pain with breathing. Forwarding this message to JClL as Vickie has already left for the day. Pt can be reached at [phone] and uses [facility] on [address]

## 2021-03-04 ENCOUNTER — Ambulatory Visit (INDEPENDENT_AMBULATORY_CARE_PROVIDER_SITE_OTHER): Payer: Managed Care, Other (non HMO) | Admitting: Family Medicine

## 2021-03-04 ENCOUNTER — Encounter (INDEPENDENT_AMBULATORY_CARE_PROVIDER_SITE_OTHER): Payer: Self-pay | Admitting: Family Medicine

## 2021-03-04 ENCOUNTER — Other Ambulatory Visit: Payer: Self-pay

## 2021-03-04 VITALS — BP 146/87 | HR 63 | Temp 98.0°F | Ht 65.0 in | Wt 362.0 lb

## 2021-03-04 DIAGNOSIS — I10 Essential (primary) hypertension: Secondary | ICD-10-CM | POA: Diagnosis not present

## 2021-03-04 DIAGNOSIS — E7849 Other hyperlipidemia: Secondary | ICD-10-CM

## 2021-03-04 DIAGNOSIS — R7303 Prediabetes: Secondary | ICD-10-CM

## 2021-03-04 DIAGNOSIS — E8881 Metabolic syndrome: Secondary | ICD-10-CM | POA: Diagnosis not present

## 2021-03-04 DIAGNOSIS — E559 Vitamin D deficiency, unspecified: Secondary | ICD-10-CM

## 2021-03-04 DIAGNOSIS — Z9189 Other specified personal risk factors, not elsewhere classified: Secondary | ICD-10-CM | POA: Diagnosis not present

## 2021-03-04 DIAGNOSIS — E538 Deficiency of other specified B group vitamins: Secondary | ICD-10-CM

## 2021-03-04 DIAGNOSIS — Z6841 Body Mass Index (BMI) 40.0 and over, adult: Secondary | ICD-10-CM

## 2021-03-04 NOTE — Progress Notes (Signed)
The 10-year ASCVD risk score [name] [name]., et al., 2013) is: 6.7%   Values used to calculate the score:     Age: 45 years     Sex: Female     Is Non-Hispanic African American: Yes     Diabetic: No     Tobacco smoker: No     Systolic Blood Pressure: 993 mmHg     Is BP treated: Yes     HDL Cholesterol: 41 mg/dL     Total Cholesterol: 188 mg/dL

## 2021-03-05 LAB — LIPID PANEL WITH LDL/HDL RATIO
Cholesterol, Total: 157 mg/dL (ref 100–199)
HDL: 40 mg/dL (ref >39)
LDL Chol Calc (NIH): 102 mg/dL — ABNORMAL HIGH (ref 0–99)
LDL/HDL Ratio: 2.6 ratio (ref 0.0–3.2)
Triglycerides: 80 mg/dL (ref 0–149)
VLDL Cholesterol Cal: 15 mg/dL (ref 5–40)

## 2021-03-05 LAB — COMPREHENSIVE METABOLIC PANEL
ALT: 15 IU/L (ref 0–32)
AST: 24 IU/L (ref 0–40)
Albumin/Globulin Ratio: 1.3 (ref 1.2–2.2)
Albumin: 4 g/dL (ref 3.8–4.8)
Alkaline Phosphatase: 98 IU/L (ref 44–121)
BUN/Creatinine Ratio: 6 — ABNORMAL LOW (ref 9–23)
BUN: 4 mg/dL — ABNORMAL LOW (ref 6–24)
Bilirubin Total: 0.8 mg/dL (ref 0.0–1.2)
CO2: 25 mmol/L (ref 20–29)
Calcium: 9.2 mg/dL (ref 8.7–10.2)
Chloride: 105 mmol/L (ref 96–106)
Creatinine, Ser: 0.69 mg/dL (ref 0.57–1.00)
Globulin, Total: 3.2 g/dL (ref 1.5–4.5)
Glucose: 83 mg/dL (ref 65–99)
Potassium: 4.5 mmol/L (ref 3.5–5.2)
Sodium: 143 mmol/L (ref 134–144)
Total Protein: 7.2 g/dL (ref 6.0–8.5)
eGFR: 110 mL/min/{1.73_m2} (ref >59)

## 2021-03-05 LAB — HEMOGLOBIN A1C
Est. average glucose Bld gHb Est-mCnc: 108 mg/dL
Hgb A1c MFr Bld: 5.4 % (ref 4.8–5.6)

## 2021-03-05 LAB — VITAMIN B12: Vitamin B-12: 240 pg/mL (ref 232–1245)

## 2021-03-05 LAB — VITAMIN D 25 HYDROXY (VIT D DEFICIENCY, FRACTURES): Vit D, 25-Hydroxy: 27.8 ng/mL — ABNORMAL LOW (ref 30.0–100.0)

## 2021-03-05 LAB — INSULIN, RANDOM: INSULIN: 8.7 u[IU]/mL (ref 2.6–24.9)

## 2021-03-11 ENCOUNTER — Other Ambulatory Visit: Payer: Self-pay

## 2021-03-11 ENCOUNTER — Other Ambulatory Visit: Payer: Managed Care, Other (non HMO)

## 2021-03-11 ENCOUNTER — Other Ambulatory Visit: Payer: Self-pay | Admitting: Internal Medicine

## 2021-03-11 ENCOUNTER — Encounter (INDEPENDENT_AMBULATORY_CARE_PROVIDER_SITE_OTHER): Payer: Self-pay | Admitting: Family Medicine

## 2021-03-11 DIAGNOSIS — B181 Chronic viral hepatitis B without delta-agent: Secondary | ICD-10-CM

## 2021-03-11 NOTE — Progress Notes (Signed)
Chief Complaint:   OBESITY Ann Woods is here to discuss her progress with her obesity treatment plan along with follow-up of her obesity related diagnoses. [name] is on keeping a food journal and adhering to recommended goals of 1800-2000 calories and 90 grams of protein daily and states she is following her eating plan approximately 50% of the time. [name] states she is walking some.   Today's visit was #: 40 Starting weight: 403 lbs Starting date: 08/31/2018 Today's weight: 362 lbs Today's date: 03/04/2021 Total lbs lost to date: 41 Total lbs lost since last in-office visit: 0  Interim History: [name] is just getting over pneumonia, but she is feeling better. She did not journal while she was sick, but she started back yesterday. She notes yesterday's protein was low at 60 grams. She says she is out of protein shakes which she depends on to obtain her protein goal.. Her calories averaged 1500. Her appetite has been low. She works 10-12 hour days and she has no time for exercise.  Subjective:   1. Vitamin D deficiency [name]'s  last Vit D level was slightly low at 36.7. She is on Vit D every 3 days.  Lab Results  Component Value Date   VD25OH 27.8 (L) 03/04/2021   VD25OH 36.7 08/06/2020   VD25OH 23.8 (L) 01/30/2020   2. Insulin resistance [name] denies polyphagia, and she is not on metformin.  Lab Results  Component Value Date   INSULIN 8.7 03/04/2021   INSULIN 9.2 08/06/2020   INSULIN 13.2 01/30/2020   INSULIN 20.8 10/10/2019   INSULIN 16.0 06/23/2019   Lab Results  Component Value Date   HGBA1C 5.4 03/04/2021   3. Other hyperlipidemia [name]'s last LDL was high at 131. Her triglycerides were within normal limits and HDL was low at 41. She is not on statin. Her mother has heart disease at 39. Her 10 year ASCVD risk score is 6.7%.  Lab Results  Component Value Date   ALT 15 03/04/2021   AST 24 03/04/2021   GGT 28 06/17/2018   ALKPHOS 98 03/04/2021   BILITOT  0.8 03/04/2021   Lab Results  Component Value Date   CHOL 157 03/04/2021   HDL 40 03/04/2021   LDLCALC 102 (H) 03/04/2021   TRIG 80 03/04/2021   CHOLHDL 3.3 03/02/2018   4. Essential hypertension [name]'s blood pressure is high today. BP has been borderline for a while.  BP Readings from Last 3 Encounters:  03/04/21 (!) 146/87  02/08/21 (!) 144/91  12/31/20 (!) 142/74   Lab Results  Component Value Date   CREATININE 0.69 03/04/2021   CREATININE 0.70 08/06/2020   CREATININE 0.65 03/12/2020   5. B12 deficiency [name] is on OTC B12 supplementation. Her last B12 was low normal at 335. She is vegan.  Lab Results  Component Value Date   VITAMINB12 240 03/04/2021   6. At risk for impaired metabolic function [name] is at increased risk for impaired metabolic function due to insufficient protein intake.  Assessment/Plan:   1. Vitamin D deficiency We will check labs today. [name] will follow-up for routine testing of Vitamin D, at least 2-3 times per year to avoid over-replacement.  - VITAMIN D 25 Hydroxy (Vit-D Deficiency, Fractures)  2. Insulin resistance We will check labs today. [name] agreed to follow-up with [place] as directed to closely monitor her progress.  - Comprehensive metabolic panel - Hemoglobin A1c - Insulin, random  3. Other hyperlipidemia Cardiovascular risk and specific lipid/LDL goals reviewed. We  discussed several lifestyle modifications today. We will check labs today.  - Lipid Panel With LDL/HDL Ratio  4. Essential hypertension [name] will speak with her primary care physician in regard to elevated blood pressures at her next office visit.   5. B12 deficiency  We will check labs today, and will continue to monitor.  - Vitamin B12  6. At risk for impaired metabolic function [name] was given approximately 15 minutes of impaired  metabolic function prevention counseling today. We discussed intensive lifestyle modifications today with an  emphasis on specific nutrition and exercise instructions and strategies.   Repetitive spaced learning was employed today to elicit superior memory formation and behavioral change.  7. Obesity: Current BMI 60.24 [name] is currently in the action stage of change. As such, her goal is to continue with weight loss efforts. She has agreed to keeping a food journal and adhering to recommended goals of 1800-2000 calories and 90 grams of protein daily.   Exercise goals: All adults should avoid inactivity. Some physical activity is better than none, and adults who participate in any amount of physical activity gain some health benefits.  Behavioral modification strategies: increasing lean protein intake and planning for success.  [name] has agreed to follow-up with our clinic in 4 weeks.  [name] was informed we would discuss her lab results at her next visit unless there is a critical issue that needs to be addressed sooner. [name] agreed to keep her next visit at the agreed upon time to discuss these results.  Objective:   Blood pressure (!) 146/87, pulse 63, temperature 98 F (36.7 C), height 5\' 5"  (1.651 m), weight (!) 362 lb (164.2 kg), last menstrual period 02/15/2021, SpO2 99 %. Body mass index is 60.24 kg/m.  General: Cooperative, alert, well developed, in no acute distress. HEENT: Conjunctivae and lids unremarkable. Cardiovascular: Regular rhythm.  Lungs: Normal work of breathing. Neurologic: No focal deficits.   Lab Results  Component Value Date   CREATININE 0.69 03/04/2021   BUN 4 (L) 03/04/2021   NA 143 03/04/2021   K 4.5 03/04/2021   CL 105 03/04/2021   CO2 25 03/04/2021   Lab Results  Component Value Date   ALT 15 03/04/2021   AST 24 03/04/2021   GGT 28 06/17/2018   ALKPHOS 98 03/04/2021   BILITOT 0.8 03/04/2021   Lab Results  Component Value Date   HGBA1C 5.4 03/04/2021   HGBA1C 5.4 08/06/2020   HGBA1C 5.5 01/30/2020   HGBA1C 5.8 (H) 10/10/2019   HGBA1C 6.0  (H) 06/23/2019   Lab Results  Component Value Date   INSULIN 8.7 03/04/2021   INSULIN 9.2 08/06/2020   INSULIN 13.2 01/30/2020   INSULIN 20.8 10/10/2019   INSULIN 16.0 06/23/2019   Lab Results  Component Value Date   TSH 1.520 06/23/2019   Lab Results  Component Value Date   CHOL 157 03/04/2021   HDL 40 03/04/2021   LDLCALC 102 (H) 03/04/2021   TRIG 80 03/04/2021   CHOLHDL 3.3 03/02/2018   Lab Results  Component Value Date   VD25OH 27.8 (L) 03/04/2021   VD25OH 36.7 08/06/2020   VD25OH 23.8 (L) 01/30/2020   Lab Results  Component Value Date   WBC 3.4 08/10/2019   HGB 11.9 08/10/2019   HCT 36.9 08/10/2019   MCV 79 08/10/2019   PLT 239 08/10/2019   Lab Results  Component Value Date   IRON 34 02/03/2019   TIBC 355 02/03/2019   FERRITIN 23 02/03/2019   Attestation Statements:  Reviewed by clinician on day of visit: allergies, medications, problem list, medical history, surgical history, family history, social history, and previous encounter notes.   Wilhemena Durie, am acting as Location manager for [employer], FNP-C.  I have reviewed the above documentation for accuracy and completeness, and I agree with the above. -  Georgianne Fick, FNP

## 2021-03-14 LAB — COMPLETE METABOLIC PANEL WITH GFR
AG Ratio: 1.3 (calc) (ref 1.0–2.5)
ALT: 13 U/L (ref 6–29)
AST: 21 U/L (ref 10–30)
Albumin: 3.9 g/dL (ref 3.6–5.1)
Alkaline phosphatase (APISO): 81 U/L (ref 31–125)
BUN: 11 mg/dL (ref 7–25)
CO2: 26 mmol/L (ref 20–32)
Calcium: 8.9 mg/dL (ref 8.6–10.2)
Chloride: 104 mmol/L (ref 98–110)
Creat: 0.76 mg/dL (ref 0.50–0.99)
Globulin: 3.1 g/dL (calc) (ref 1.9–3.7)
Glucose, Bld: 100 mg/dL — ABNORMAL HIGH (ref 65–99)
Potassium: 4.3 mmol/L (ref 3.5–5.3)
Sodium: 139 mmol/L (ref 135–146)
Total Bilirubin: 0.7 mg/dL (ref 0.2–1.2)
Total Protein: 7 g/dL (ref 6.1–8.1)
eGFR: 99 mL/min/{1.73_m2} (ref >=60)

## 2021-03-14 LAB — HEPATITIS B DNA, ULTRAQUANTITATIVE, PCR
Hepatitis B DNA (Calc): 3.39 Log IU/mL — ABNORMAL HIGH
Hepatitis B DNA: 2470 IU/mL — ABNORMAL HIGH

## 2021-03-25 ENCOUNTER — Other Ambulatory Visit: Payer: Self-pay

## 2021-03-25 ENCOUNTER — Ambulatory Visit (INDEPENDENT_AMBULATORY_CARE_PROVIDER_SITE_OTHER): Payer: Managed Care, Other (non HMO) | Admitting: Internal Medicine

## 2021-03-25 ENCOUNTER — Encounter: Payer: Self-pay | Admitting: Internal Medicine

## 2021-03-25 VITALS — BP 149/86 | HR 84 | Temp 98.5°F | Wt 368.0 lb

## 2021-03-25 DIAGNOSIS — K760 Fatty (change of) liver, not elsewhere classified: Secondary | ICD-10-CM | POA: Diagnosis not present

## 2021-03-25 DIAGNOSIS — Z9189 Other specified personal risk factors, not elsewhere classified: Secondary | ICD-10-CM | POA: Diagnosis not present

## 2021-03-25 DIAGNOSIS — B181 Chronic viral hepatitis B without delta-agent: Secondary | ICD-10-CM | POA: Diagnosis not present

## 2021-03-25 DIAGNOSIS — K7400 Hepatic fibrosis, unspecified: Secondary | ICD-10-CM | POA: Diagnosis not present

## 2021-03-25 NOTE — Progress Notes (Signed)
   Subjective:    Patient ID: Ann Woods, female    DOB: [birth year], 45 y.o.   MRN: [id number]  HPI Here for follow up of chronic inactive carrier state of hepatitis B.   She is hepatitis B E Ag negative with no transaminitis and though the viral load is just above 2,000 IU, it has remained inactive.  No new complaints.   She does have fatty liver.   Labs prior to the visit with a DNA of 2,470 and AST/ALT 21/13.   No new issues otherwise.    Review of Systems  Constitutional:  Negative for fatigue.  Gastrointestinal:  Negative for abdominal pain and diarrhea.  Skin:  Negative for rash.      Objective:   Physical Exam Eyes:     General: No scleral icterus. Pulmonary:     Effort: Pulmonary effort is normal.  Neurological:     General: No focal deficit present.     Mental Status: She is alert.  Psychiatric:        Mood and Affect: Mood normal.   SH: occasional alcohol       Assessment & Plan:

## 2021-03-25 NOTE — Assessment & Plan Note (Signed)
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  rtc 1 year

## 2021-03-25 NOTE — Assessment & Plan Note (Signed)
Discussed this and efforts at weight loss, increasing exercise.

## 2021-03-25 NOTE — Assessment & Plan Note (Signed)
Will continue with [facility] screening with ultrasound every 6 months.  r

## 2021-04-01 ENCOUNTER — Encounter (INDEPENDENT_AMBULATORY_CARE_PROVIDER_SITE_OTHER): Payer: Self-pay | Admitting: Family Medicine

## 2021-04-01 ENCOUNTER — Ambulatory Visit (INDEPENDENT_AMBULATORY_CARE_PROVIDER_SITE_OTHER): Payer: Managed Care, Other (non HMO) | Admitting: Family Medicine

## 2021-04-01 ENCOUNTER — Other Ambulatory Visit: Payer: Self-pay

## 2021-04-01 VITALS — BP 138/83 | HR 66 | Temp 97.6°F | Ht 65.0 in | Wt 370.0 lb

## 2021-04-01 DIAGNOSIS — E538 Deficiency of other specified B group vitamins: Secondary | ICD-10-CM | POA: Diagnosis not present

## 2021-04-01 DIAGNOSIS — Z6841 Body Mass Index (BMI) 40.0 and over, adult: Secondary | ICD-10-CM

## 2021-04-01 DIAGNOSIS — E559 Vitamin D deficiency, unspecified: Secondary | ICD-10-CM | POA: Diagnosis not present

## 2021-04-01 DIAGNOSIS — E7849 Other hyperlipidemia: Secondary | ICD-10-CM

## 2021-04-02 NOTE — Progress Notes (Signed)
Chief Complaint:   OBESITY Ann Woods is here to discuss her progress with her obesity treatment plan along with follow-up of her obesity related diagnoses. Ann Woods is on keeping a food journal and adhering to recommended goals of 1800-2000 calories and 90 grams of protein and states she is following her eating plan approximately 50% of the time. Ann Woods states she is doing 0 minutes 0 times per week.  Today's visit was #: 19 Starting weight: 403 lbs Starting date: 08/31/2018 Today's weight: 370 lbs Today's date: 04/01/2021 Total lbs lost to date: 33 lbs Total lbs lost since last in-office visit: 0  Interim History: Ann Woods has not been journaling recently. She notes excessive stress from work. She works 6-7 days per week. She has been overeating some things like pasta salad. She is vegan and really struggles to get in adequate protein. She is considering adding in eggs to assist with getting in more protein.. Subjective:   1. Other hyperlipidemia Ann Woods's LDL improved from 131 to 102. Her HDL was low. Her triglycerides were within normal limits.  Lab Results  Component Value Date   CHOL 157 03/04/2021   HDL 40 03/04/2021   LDLCALC 102 (H) 03/04/2021   TRIG 80 03/04/2021   CHOLHDL 3.3 03/02/2018   Lab Results  Component Value Date   ALT 13 03/11/2021   AST 21 03/11/2021   GGT 28 06/17/2018   ALKPHOS 98 03/04/2021   BILITOT 0.7 03/11/2021   The 10-year ASCVD risk score [name] DC Jr., et al., 2013) is: 4.6%   Values used to calculate the score:     Age: 45 years     Sex: Female     Is Non-Hispanic African American: Yes     Diabetic: No     Tobacco smoker: No     Systolic Blood Pressure: 0000000 mmHg     Is BP treated: Yes     HDL Cholesterol: 40 mg/dL     Total Cholesterol: 157 mg/dL   2. Vitamin D deficiency Ann Woods's Vitamin D is low at 27.8 despite good compliance with Vitamin D. We will refill Vitamin D.  Lab Results  Component Value Date   VD25OH 27.8 (L)  03/04/2021   VD25OH 36.7 08/06/2020   VD25OH 23.8 (L) 01/30/2020    3. B12 deficiency Ann Woods's B12 was low normal. She has not been consistent with taking supplement. She is vegan.  CBC Latest Ref Rng & Units 08/10/2019 02/03/2019 05/27/2018  WBC 3.4 - 10.8 x10E3/uL 3.4 3.6 3.6(L)  Hemoglobin 11.1 - 15.9 g/dL 11.9 10.4(L) 12.1  Hematocrit 34.0 - 46.6 % 36.9 31.9(L) 39.1  Platelets 150 - 450 x10E3/uL 239 - 326.0   Lab Results  Component Value Date   IRON 34 02/03/2019   TIBC 355 02/03/2019   FERRITIN 23 02/03/2019   Lab Results  Component Value Date   VITAMINB12 240 03/04/2021     Assessment/Plan:   1. Other hyperlipidemia Ann Woods will continue meal plan. Cardiovascular risk and specific lipid/LDL goals reviewed.  We discussed several lifestyle modifications today and Ann Woods will continue to work on diet and weight loss efforts.   2. Vitamin D deficiency Ann Woods agrees to add OTC Vitamin D. She has some at home but unsure of dose. Ann Woods will continue prescription Vitamin D 50,000 IU every week and will follow-up for routine testing of Vitamin D, at least 2-3 times per year to avoid over-replacement.   3. B12 deficiency Ann Woods will work on taking B12 consistently. I discussed with  Ann Woods using a pill box.  4. Obesity: Current BMI 61.57 Ann Woods is currently in the action stage of change. As such, her goal is to continue with weight loss efforts. She has agreed to keeping a food journal and adhering to recommended goals of 1800-2000 calories and 90 grams of protein daily.   Ann Woods will work on journaling more consistently.  Exercise goals: No exercise has been prescribed at this time.  Behavioral modification strategies: increasing lean protein intake, meal planning and cooking strategies, and keeping a strict food journal.  Ann Woods has agreed to follow-up with our clinic in 4 weeks.  Objective:   Blood pressure 138/83, pulse 66, temperature 97.6 F (36.4 C), height  '5\' 5"'$  (1.651 m), weight (!) 370 lb (167.8 kg), SpO2 99 %. Body mass index is 61.57 kg/m.  General: Cooperative, alert, well developed, in no acute distress. HEENT: Conjunctivae and lids unremarkable. Cardiovascular: Regular rhythm.  Lungs: Normal work of breathing. Neurologic: No focal deficits.   Lab Results  Component Value Date   CREATININE 0.76 03/11/2021   BUN 11 03/11/2021   NA 139 03/11/2021   K 4.3 03/11/2021   CL 104 03/11/2021   CO2 26 03/11/2021   Lab Results  Component Value Date   ALT 13 03/11/2021   AST 21 03/11/2021   GGT 28 06/17/2018   ALKPHOS 98 03/04/2021   BILITOT 0.7 03/11/2021   Lab Results  Component Value Date   HGBA1C 5.4 03/04/2021   HGBA1C 5.4 08/06/2020   HGBA1C 5.5 01/30/2020   HGBA1C 5.8 (H) 10/10/2019   HGBA1C 6.0 (H) 06/23/2019   Lab Results  Component Value Date   INSULIN 8.7 03/04/2021   INSULIN 9.2 08/06/2020   INSULIN 13.2 01/30/2020   INSULIN 20.8 10/10/2019   INSULIN 16.0 06/23/2019   Lab Results  Component Value Date   TSH 1.520 06/23/2019   Lab Results  Component Value Date   CHOL 157 03/04/2021   HDL 40 03/04/2021   LDLCALC 102 (H) 03/04/2021   TRIG 80 03/04/2021   CHOLHDL 3.3 03/02/2018   Lab Results  Component Value Date   VD25OH 27.8 (L) 03/04/2021   VD25OH 36.7 08/06/2020   VD25OH 23.8 (L) 01/30/2020   Lab Results  Component Value Date   WBC 3.4 08/10/2019   HGB 11.9 08/10/2019   HCT 36.9 08/10/2019   MCV 79 08/10/2019   PLT 239 08/10/2019   Lab Results  Component Value Date   IRON 34 02/03/2019   TIBC 355 02/03/2019   FERRITIN 23 02/03/2019   Attestation Statements:   Reviewed by clinician on day of visit: allergies, medications, problem list, medical history, surgical history, family history, social history, and previous encounter notes.  I, Lizbeth Bark, RMA, am acting as Location manager for [employer], [place].   I have reviewed the above documentation for accuracy and completeness, and I  agree with the above. -  Georgianne Fick, FNP

## 2021-05-06 ENCOUNTER — Ambulatory Visit (INDEPENDENT_AMBULATORY_CARE_PROVIDER_SITE_OTHER): Payer: Managed Care, Other (non HMO) | Admitting: Family Medicine

## 2021-05-14 ENCOUNTER — Ambulatory Visit (INDEPENDENT_AMBULATORY_CARE_PROVIDER_SITE_OTHER): Payer: Managed Care, Other (non HMO) | Admitting: Family Medicine

## 2021-05-23 ENCOUNTER — Telehealth: Payer: Self-pay | Admitting: Family Medicine

## 2021-05-23 NOTE — Telephone Encounter (Signed)
..   Pt understands that although there may be some limitations with this type of visit, we will take all precautions to reduce any security or privacy concerns.  Pt understands that this will be treated like an in office visit and we will file with pt's insurance, and there may be a patient responsible charge related to this service. ? ?

## 2021-05-23 NOTE — Progress Notes (Signed)
   PATIENT: Ann Woods DOB: 05-19-1976  REASON FOR VISIT: follow up HISTORY FROM: patient  Virtual Visit via Telephone Note  I connected with Ann Woods on 05/27/21 at  8:30 AM EDT by telephone and verified that I am speaking with the correct person using two identifiers.   I discussed the limitations, risks, security and privacy concerns of performing an evaluation and management service by telephone and the availability of in person appointments. I also discussed with the patient that there may be a patient responsible charge related to this service. The patient expressed understanding and agreed to proceed.   History of Present Illness:  05/27/21 ALL:  Ann Woods returns for follow up for OSA on CPAP. She has been struggling to meet compliance goals of at least 70%. Last visit 10/2020, she was doing fairly well and compliance was better. Since, she reports that compliance has fallen off a bit. She denies concerns with machine or supplies. She reports that she falls asleep before starting therapy. She does feel better when using CPAP consistently. She reports headaches are significantly better.     11/19/2020 ALL:  She returns for CPAP follow up. She is doing well. She has improved compliance and feels she is sleeping better. She has noted air leaking around the top of her full face mask but continues to adjust at home. Leak has been acceptable. She denies concerns today.      Observations/Objective:  Generalized: Well developed, in no acute distress  Mentation: Alert oriented to time, place, history taking. Follows all commands speech and language fluent   Assessment and Plan:  45 y.o. year old female  has a past medical history of Allergies, Anemia, Hepatitis B, High cholesterol, Hypertension, Infertility, female, Lactose intolerance, Obesity, Obesity, Prediabetes (04/01/2018), Seasonal allergies, Shortness of breath, Swelling of both lower extremities, and Urticaria.  here with    ICD-10-CM   1. OSA on CPAP  G47.33    Z99.89       Doreene continues to work on compliance. We have reviewed ways to help increase compliance with starting therapy as soon as she gets in her bed or setting an alarm to remind her to start therapy should she fall asleep. Most recent 90 day report shows 56% daily compliance and 48% 4 hour compliance. She is motivated to continue using CPAP. She was encouraged to use CPAP nightly for at least 4 hours. She will return for compliance review in 4-5 months. She verbalizes understanding and wishes to call me back to schedule appt.   No orders of the defined types were placed in this encounter.   No orders of the defined types were placed in this encounter.    Follow Up Instructions:  I discussed the assessment and treatment plan with the patient. The patient was provided an opportunity to ask questions and all were answered. The patient agreed with the plan and demonstrated an understanding of the instructions.   The patient was advised to call back or seek an in-person evaluation if the symptoms worsen or if the condition fails to improve as anticipated.  I provided 15 minutes of non-face-to-face time during this encounter. Patient located at their place of residence during [place] visit. Provider is in the office.    Debbora Presto, NP

## 2021-05-23 NOTE — Patient Instructions (Signed)
Please continue using your CPAP regularly. While your insurance requires that you use CPAP at least 4 hours each night on 70% of the nights, I recommend, that you not skip any nights and use it throughout the night if you can. Getting used to CPAP and staying with the treatment long term does take time and patience and discipline. Untreated obstructive sleep apnea when it is moderate to severe can have an adverse impact on cardiovascular health and raise her risk for heart disease, arrhythmias, hypertension, congestive heart failure, stroke and diabetes. Untreated obstructive sleep apnea causes sleep disruption, nonrestorative sleep, and sleep deprivation. This can have an impact on your day to day functioning and cause daytime sleepiness and impairment of cognitive function, memory loss, mood disturbance, and problems focussing. Using CPAP regularly can improve these symptoms.   Follow up in 4-5 months

## 2021-05-27 ENCOUNTER — Encounter: Payer: Self-pay | Admitting: Family Medicine

## 2021-05-27 ENCOUNTER — Telehealth (INDEPENDENT_AMBULATORY_CARE_PROVIDER_SITE_OTHER): Payer: Managed Care, Other (non HMO) | Admitting: Family Medicine

## 2021-05-27 DIAGNOSIS — G4733 Obstructive sleep apnea (adult) (pediatric): Secondary | ICD-10-CM | POA: Diagnosis not present

## 2021-05-27 DIAGNOSIS — Z9989 Dependence on other enabling machines and devices: Secondary | ICD-10-CM | POA: Diagnosis not present

## 2021-07-26 ENCOUNTER — Other Ambulatory Visit: Payer: Managed Care, Other (non HMO)

## 2021-07-29 ENCOUNTER — Other Ambulatory Visit: Payer: Managed Care, Other (non HMO)

## 2021-08-21 ENCOUNTER — Ambulatory Visit
Admission: RE | Admit: 2021-08-21 | Discharge: 2021-08-21 | Disposition: A | Discharge status: Home / Self Care | Payer: Managed Care, Other (non HMO) | Source: Ambulatory Visit | Attending: Internal Medicine | Admitting: Internal Medicine

## 2021-08-21 DIAGNOSIS — K7400 Hepatic fibrosis, unspecified: Secondary | ICD-10-CM

## 2021-08-21 DIAGNOSIS — B181 Chronic viral hepatitis B without delta-agent: Secondary | ICD-10-CM

## 2022-01-24 ENCOUNTER — Other Ambulatory Visit: Payer: Managed Care, Other (non HMO)

## 2022-01-27 ENCOUNTER — Ambulatory Visit: Payer: Managed Care, Other (non HMO) | Admitting: Family Medicine

## 2022-01-27 ENCOUNTER — Other Ambulatory Visit: Payer: Managed Care, Other (non HMO)

## 2022-02-03 ENCOUNTER — Ambulatory Visit
Admission: RE | Admit: 2022-02-03 | Discharge: 2022-02-03 | Disposition: A | Discharge status: Home / Self Care | Payer: Managed Care, Other (non HMO) | Source: Ambulatory Visit | Attending: Internal Medicine | Admitting: Internal Medicine

## 2022-02-03 ENCOUNTER — Ambulatory Visit: Payer: Managed Care, Other (non HMO) | Admitting: Family Medicine

## 2022-02-03 ENCOUNTER — Encounter: Payer: Self-pay | Admitting: Family Medicine

## 2022-02-03 VITALS — BP 120/74 | HR 87 | Temp 98.4°F | Wt 363.2 lb

## 2022-02-03 DIAGNOSIS — K7400 Hepatic fibrosis, unspecified: Secondary | ICD-10-CM

## 2022-02-03 DIAGNOSIS — L309 Dermatitis, unspecified: Secondary | ICD-10-CM

## 2022-02-03 DIAGNOSIS — M65311 Trigger thumb, right thumb: Secondary | ICD-10-CM | POA: Diagnosis not present

## 2022-02-03 DIAGNOSIS — B181 Chronic viral hepatitis B without delta-agent: Secondary | ICD-10-CM

## 2022-02-03 NOTE — Progress Notes (Signed)
   Subjective:    Patient ID: Ann Woods, female    DOB: 1975/09/07, 46 y.o.   MRN: [id number]  HPI She complains of a 1 month history of difficulty with triggering sensation in the right thumb.  She can mechanically unlock this.  She is also having some tenderness of the proximal joint as well.  She also complains of a rash present on the foot and the Achilles tendon area.   Review of Systems     Objective:   Physical Exam Exam of the left thumb does show some tenderness palpation over the PIP joint.  Normal motion of the thumb.  Normal strength. Exam of both feet does show some thickening and hyperpigmentation just proximal to the plantar surface of both feet.     Assessment & Plan:  Trigger finger of right thumb - Plan: Ambulatory referral to Orthopedic Surgery  Dermatitis - Plan: Ambulatory referral to Dermatology

## 2022-02-10 ENCOUNTER — Ambulatory Visit (INDEPENDENT_AMBULATORY_CARE_PROVIDER_SITE_OTHER): Payer: Managed Care, Other (non HMO) | Admitting: Orthopedic Surgery

## 2022-02-10 DIAGNOSIS — M65311 Trigger thumb, right thumb: Secondary | ICD-10-CM | POA: Diagnosis not present

## 2022-02-10 DIAGNOSIS — M65341 Trigger finger, right ring finger: Secondary | ICD-10-CM | POA: Diagnosis not present

## 2022-02-10 MED ORDER — BETAMETHASONE SOD PHOS & ACET 6 (3-3) MG/ML IJ SUSP
6.0000 mg | INTRAMUSCULAR | Status: AC | PRN
  Administered 2022-02-10: 6 mg via INTRA_ARTICULAR

## 2022-02-10 MED ORDER — LIDOCAINE HCL 1 % IJ SOLN
1.0000 mL | INTRAMUSCULAR | Status: AC | PRN
  Administered 2022-02-10: 1 mL

## 2022-02-10 NOTE — Progress Notes (Signed)
Office Visit Note   Patient: Ann [facility]           Date of Birth: 1976/08/17           MRN: [id number] Visit Date: 02/10/2022              Requested by: Denita Lung, MD [place],  [place] [address] PCP: Girtha Rm, NP-C   Assessment & Plan: Visit Diagnoses:  1. Trigger thumb, right thumb   2. Trigger finger, right ring finger     Plan: We reviewed the nature of trigger finger as well as his diagnosis, prognosis, and both conservative and surgical treatment options.  After our discussion, she would like to try corticosteroid injection into the right thumb and right ring finger A1 pulleys.  I can see her back in a few months if she remains symptomatic.  Follow-Up Instructions: No follow-ups on file.   Orders:  No orders of the defined types were placed in this encounter.  No orders of the defined types were placed in this encounter.     Procedures: Hand/UE Inj: R thumb A1 for trigger finger on 02/10/2022 11:14 AM Indications: tendon swelling and therapeutic Details: 25 G needle, volar approach Medications: 1 mL lidocaine 1 %; 6 mg betamethasone acetate-betamethasone sodium phosphate 6 (3-3) MG/ML Procedure, treatment alternatives, risks and benefits explained, specific risks discussed. Consent was given by the patient. Immediately prior to procedure a time out was called to verify the correct patient, procedure, equipment, support staff and site/side marked as required. Patient was prepped and draped in the usual sterile fashion.    Hand/UE Inj: R ring A1 for trigger finger on 02/10/2022 11:14 AM Indications: tendon swelling and therapeutic Details: 25 G needle, volar approach Medications: 1 mL lidocaine 1 %; 6 mg betamethasone acetate-betamethasone sodium phosphate 6 (3-3) MG/ML Procedure, treatment alternatives, risks and benefits explained, specific risks discussed. Consent was given by the patient. Immediately prior to procedure a time out  was called to verify the correct patient, procedure, equipment, support staff and site/side marked as required. Patient was prepped and draped in the usual sterile fashion.       Clinical Data: No additional findings.   Subjective: Chief Complaint  Patient presents with   Right Thumb - Pain    RIGHT handed,     This is a 46 year old right-hand-dominant female presents with painful triggering of the right thumb and right ring finger.  The thumb started about a month ago.  She reports that with certain activities that the thumb will be stuck in flexion at the IP joint during activities involve a tight grip.  She is able to unlock the IP joint but this is painful.  This has worsened over the last month or so.  She also noticed over the last week new onset triggering of the ring finger.  This is much less painful but still bothersome for her.  She had no treatment for this so far.    Review of Systems   Objective: Vital Signs: There were no vitals taken for this visit.  Physical Exam Constitutional:      Appearance: Normal appearance.  Cardiovascular:     Rate and Rhythm: Normal rate.     Pulses: Normal pulses.  Pulmonary:     Effort: Pulmonary effort is normal.  Skin:    General: Skin is warm and dry.     Capillary Refill: Capillary refill takes less than 2 seconds.  Neurological:  Mental Status: She is alert.     Right Hand Exam   Tenderness  Right hand tenderness location: TTP at thumb and ring fingers over A1 pulley.  Other  Erythema: absent Sensation: normal Pulse: present  Comments:  Palpable and visible triggering of the right thumb and ring fingers.       Specialty Comments:  No specialty comments available.  Imaging: No results found.   PMFS History: Patient Active Problem List   Diagnosis Date Noted   Trigger thumb, right thumb 02/10/2022   Trigger finger, right ring finger 02/10/2022   At risk for cancer 03/25/2021   Insulin resistance  02/22/2020   OSA on CPAP 01/30/2020   Low HDL (under 40) 11/01/2019   Other hyperlipidemia 10/10/2019   B12 deficiency 10/10/2019   Hepatic steatosis 08/29/2019   Need for prophylactic vaccination and inoculation against influenza 08/29/2019   Vitamin D deficiency 10/25/2018   Essential hypertension 10/13/2018   Depression 10/13/2018   History of iron deficiency anemia 08/31/2018   Class 3 severe obesity with serious comorbidity and body mass index (BMI) of 60.0 to 69.9 in adult ([place]) 08/31/2018   Liver fibrosis 07/15/2018   Prediabetes 04/01/2018   Anemia 02/17/2018   Dyspnea on exertion 02/17/2018   Cardiomegaly 02/17/2018   Morbid obesity ([place]) 02/17/2018   Abnormal PFT 02/17/2018   Sterilization 05/05/2014   Chronic Hypertension in pregnancy 12/01/2013   Chronic viral hepatitis B without delta-agent ([place]) 12/01/2013   Allergic rhinitis 05/31/2007   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   ANEMIA-IRON DEFICIENCY 04/28/2007   PAP SMEAR, ABNORMAL 04/28/2007   Past Medical History:  Diagnosis Date   Allergies    reports allergies to dust, pollen, grass and trees   Anemia    Hepatitis B    High cholesterol    Hypertension    G2 - preeclampsia, CHTN   Infertility, female    Lactose intolerance    Obesity    Obesity    Prediabetes 04/01/2018   Seasonal allergies    Shortness of breath    Swelling of both lower extremities    Urticaria     Family History  Problem Relation Age of Onset   Heart disease Mother    Thyroid disease Mother    High blood pressure Mother    Obesity Mother    COPD Father    Allergic rhinitis Neg Hx    Angioedema Neg Hx    Asthma Neg Hx    Eczema Neg Hx    Immunodeficiency Neg Hx    Urticaria Neg Hx     Past Surgical History:  Procedure Laterality Date   CESAREAN SECTION     HIP SURGERY     TUBAL LIGATION Bilateral 05/05/2014   Procedure: POST PARTUM TUBAL LIGATION;  Surgeon: Donnamae Jude, MD;  Location: [facility];  Service: Gynecology;  Laterality: Bilateral;    Social History   Occupational History   Occupation: Glass blower/designer  Tobacco Use   Smoking status: Former    Years: 0.50    Types: Cigarettes    Quit date: 08/25/1993    Years since quitting: 28.4   Smokeless tobacco: Never   Tobacco comments:    one pack of cigarettes weekly- 05/27/18  Vaping Use   Vaping Use: Never used  Substance and Sexual Activity   Alcohol use: Not Currently   Drug use: No   Sexual activity: Yes    Birth control/protection: None

## 2022-03-26 ENCOUNTER — Other Ambulatory Visit: Payer: Self-pay

## 2022-03-26 ENCOUNTER — Encounter: Payer: Self-pay | Admitting: Internal Medicine

## 2022-03-26 ENCOUNTER — Ambulatory Visit (INDEPENDENT_AMBULATORY_CARE_PROVIDER_SITE_OTHER): Payer: Managed Care, Other (non HMO) | Admitting: Internal Medicine

## 2022-03-26 VITALS — BP 142/94 | HR 72 | Temp 98.0°F | Ht 66.0 in | Wt 360.0 lb

## 2022-03-26 DIAGNOSIS — B181 Chronic viral hepatitis B without delta-agent: Secondary | ICD-10-CM

## 2022-03-26 DIAGNOSIS — Z9189 Other specified personal risk factors, not elsewhere classified: Secondary | ICD-10-CM

## 2022-03-26 DIAGNOSIS — K760 Fatty (change of) liver, not elsewhere classified: Secondary | ICD-10-CM

## 2022-03-26 DIAGNOSIS — K8020 Calculus of gallbladder without cholecystitis without obstruction: Secondary | ICD-10-CM

## 2022-03-26 NOTE — Assessment & Plan Note (Signed)
Non-obstructive gallstones, no issues.  Discussed risks and symptoms if they become obstructive.

## 2022-03-26 NOTE — Assessment & Plan Note (Signed)
Some changes noted on ultrasound and is stable.  Discussed findings.

## 2022-03-26 NOTE — Assessment & Plan Note (Signed)
She will continue with twice yearly ultrasounds for [facility] screening.  Next due in December.

## 2022-03-26 NOTE — Progress Notes (Signed)
   Subjective:    Patient ID: Ann Woods, female    DOB: 11/04/1975, 46 y.o.   MRN: [id number]  HPI Here for follow up of chronic, inactive hepatitis B.  No new concerns.  She continues with [place] screening and last ultrasound in June without issues.  She is E Ag negative with normal transaminases.     Review of Systems  Constitutional:  Negative for fatigue.  Gastrointestinal:  Negative for diarrhea and nausea.  Skin:  Negative for rash.       Objective:   Physical Exam Eyes:     General: No scleral icterus. Pulmonary:     Effort: Pulmonary effort is normal.  Neurological:     Mental Status: She is alert.   SH: occasional alcohol        Assessment & Plan:

## 2022-03-26 NOTE — Assessment & Plan Note (Signed)
Doing well, no concerns.  Will check her labs today and if they remain stable, she will return again in 1 year.

## 2022-03-29 LAB — HEPATIC FUNCTION PANEL
AG Ratio: 1.2 (calc) (ref 1.0–2.5)
ALT: 13 U/L (ref 6–29)
AST: 18 U/L (ref 10–35)
Albumin: 3.7 g/dL (ref 3.6–5.1)
Alkaline phosphatase (APISO): 86 U/L (ref 31–125)
Bilirubin, Direct: 0.1 mg/dL (ref 0.0–0.2)
Globulin: 3.1 g/dL (calc) (ref 1.9–3.7)
Indirect Bilirubin: 0.6 mg/dL (calc) (ref 0.2–1.2)
Total Bilirubin: 0.7 mg/dL (ref 0.2–1.2)
Total Protein: 6.8 g/dL (ref 6.1–8.1)

## 2022-03-29 LAB — HEPATITIS B DNA, ULTRAQUANTITATIVE, PCR
Hepatitis B DNA: 5200 IU/mL — ABNORMAL HIGH
Hepatitis B virus DNA: 3.72 Log IU/mL — ABNORMAL HIGH

## 2022-04-01 ENCOUNTER — Telehealth: Payer: Self-pay

## 2022-04-01 NOTE — Telephone Encounter (Signed)
-----   Message from Mignon Pine, DO sent at 04/01/2022  8:23 AM EDT ----- Hep B labs are stable.  Follow up with Dr Linus Salmons as scheduled.

## 2022-04-02 ENCOUNTER — Encounter (INDEPENDENT_AMBULATORY_CARE_PROVIDER_SITE_OTHER): Payer: Self-pay

## 2022-07-09 ENCOUNTER — Encounter: Payer: Self-pay | Admitting: Internal Medicine

## 2022-07-28 ENCOUNTER — Other Ambulatory Visit: Payer: Managed Care, Other (non HMO)

## 2022-07-30 ENCOUNTER — Other Ambulatory Visit: Payer: Managed Care, Other (non HMO)

## 2022-08-07 ENCOUNTER — Ambulatory Visit
Admission: RE | Admit: 2022-08-07 | Discharge: 2022-08-07 | Discharge status: Home / Self Care | Payer: Managed Care, Other (non HMO) | Source: Ambulatory Visit | Attending: Internal Medicine | Admitting: Internal Medicine

## 2022-08-07 DIAGNOSIS — B181 Chronic viral hepatitis B without delta-agent: Secondary | ICD-10-CM

## 2022-08-26 ENCOUNTER — Telehealth: Payer: Managed Care, Other (non HMO) | Admitting: Physician Assistant

## 2022-08-26 DIAGNOSIS — K529 Noninfective gastroenteritis and colitis, unspecified: Secondary | ICD-10-CM | POA: Diagnosis not present

## 2022-08-26 NOTE — Progress Notes (Signed)
Virtual Visit Consent   Ann Woods, you are scheduled for a virtual visit with a [facility] provider today. Just as with appointments in the office, your consent must be obtained to participate. Your consent will be active for this visit and any virtual visit you may have with one of our providers in the next 365 days. If you have a MyChart account, a copy of this consent can be sent to you electronically.  As this is a virtual visit, video technology does not allow for your provider to perform a traditional examination. This may limit your provider's ability to fully assess your condition. If your provider identifies any concerns that need to be evaluated in person or the need to arrange testing (such as labs, EKG, etc.), we will make arrangements to do so. Although advances in technology are sophisticated, we cannot ensure that it will always work on either your end or our end. If the connection with a video visit is poor, the visit may have to be switched to a telephone visit. With either a video or telephone visit, we are not always able to ensure that we have a secure connection.  By engaging in this virtual visit, you consent to the provision of healthcare and authorize for your insurance to be billed (if applicable) for the services provided during this visit. Depending on your insurance coverage, you may receive a charge related to this service.  I need to obtain your verbal consent now. Are you willing to proceed with your visit today? Ann Woods has provided verbal consent on 08/26/2022 for a virtual visit (video or telephone). [name], [place]  Date: 08/26/2022 6:38 PM  Virtual Visit via Video Note   I, [name], connected with  Ann Woods  ([id number], November 03, 1975) on 08/26/22 at  6:30 PM EST by a video-enabled telemedicine application and verified that I am speaking with the correct person using two identifiers.  Location: Patient: Virtual Visit  Location Patient: Home Provider: Virtual Visit Location Provider: Home Office   I discussed the limitations of evaluation and management by telemedicine and the availability of in person appointments. The patient expressed understanding and agreed to proceed.    History of Present Illness: Ann Woods is a 47 y.o. who identifies as a female who was assigned female at birth, and is being seen today for 3 to 4 days of loose stool, worse after meals.  Notes symptoms started after she ate some chicken nuggets Friday night.  Notes she is usually 100% vegan so has not had an animal product in quite some time.  Saturday morning noted some abdominal cramping and diarrhea.  Notes stools are liquid and more frequent.  Denies melena, hematochezia or tenesmus.  Initially with a fever that resolved after the first night.  Is trying to stay well-hydrated but has not eat much.  Had some soup and rice today thankfully denies nausea or vomiting.  Denies heartburn or indigestion.  Denies recent travel or known sick contact.[name]  HPI: HPI  Problems:  Patient Active Problem List   Diagnosis Date Noted   Gallstones 03/26/2022   Trigger thumb, right thumb 02/10/2022   Trigger finger, right ring finger 02/10/2022   At risk for cancer 03/25/2021   Insulin resistance 02/22/2020   OSA on CPAP 01/30/2020   Low HDL (under 40) 11/01/2019   Other hyperlipidemia 10/10/2019   B12 deficiency 10/10/2019   Hepatic steatosis 08/29/2019   Need for prophylactic vaccination and inoculation  against influenza 08/29/2019   Vitamin D deficiency 10/25/2018   Essential hypertension 10/13/2018   Depression 10/13/2018   History of iron deficiency anemia 08/31/2018   Class 3 severe obesity with serious comorbidity and body mass index (BMI) of 60.0 to 69.9 in adult [facility]) 08/31/2018   Liver fibrosis 07/15/2018   Prediabetes 04/01/2018   Anemia 02/17/2018   Dyspnea on exertion 02/17/2018   Cardiomegaly 02/17/2018   Morbid obesity  ([place]) 02/17/2018   Abnormal PFT 02/17/2018   Sterilization 05/05/2014   Chronic Hypertension in pregnancy 12/01/2013   Chronic viral hepatitis B without delta-agent ([place]) 12/01/2013   Allergic rhinitis 05/31/2007   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   ANEMIA-IRON DEFICIENCY 04/28/2007   PAP SMEAR, ABNORMAL 04/28/2007    Allergies: No Known Allergies Medications:  Current Outpatient Medications:    albuterol (VENTOLIN HFA) 108 (90 Base) MCG/ACT inhaler, Inhale 2 puffs into the lungs every 6 (six) hours as needed for wheezing or shortness of breath., Disp: 18 g, Rfl: 0   amLODipine-benazepril (LOTREL) 5-10 MG capsule, Take 1 capsule by mouth daily., Disp: 30 capsule, Rfl: 0   azelastine (ASTELIN) 0.1 % nasal spray, Use 1 spray in each nostril twice daily, Disp: 30 mL, Rfl: 5   azelastine (OPTIVAR) 0.05 % ophthalmic solution, Use 1 drop in each eye twice daily as needed (Patient not taking: Reported on 02/03/2022), Disp: 6 mL, Rfl: 5   cetirizine (ZYRTEC ALLERGY) 10 MG tablet, Take 1 tablet (10 mg total) by mouth 2 (two) times daily., Disp: 60 tablet, Rfl: 5   famotidine (PEPCID) 20 MG tablet, Take 1 tablet (20 mg total) by mouth 2 (two) times daily., Disp: 60 tablet, Rfl: 5   ferrous sulfate 325 (65 FE) MG tablet, Take 1 tablet (325 mg total) by mouth daily with breakfast. (Patient not taking: Reported on 02/03/2022), Disp: 30 tablet, Rfl: 0   fluticasone (FLONASE) 50 MCG/ACT nasal spray, Place 2 sprays into both nostrils daily., Disp: 16 mL, Rfl: 0   levocetirizine (XYZAL) 5 MG tablet, Take 5 mg by mouth every evening. (Patient not taking: Reported on 02/03/2022), Disp: , Rfl:    methocarbamol (ROBAXIN) 500 MG tablet, Take 1 tablet (500 mg total) by mouth 4 (four) times daily. (Patient not taking: Reported on 02/03/2022), Disp: 30 tablet, Rfl: 0   montelukast (SINGULAIR) 10 MG tablet, Take 1 tablet (10 mg total) by mouth at bedtime. (Patient not taking: Reported on 02/03/2022), Disp: 30 tablet, Rfl: 5   Olopatadine  HCl (PAZEO) 0.7 % SOLN, Place 1 drop into both eyes daily. (Patient not taking: Reported on 02/03/2022), Disp: 2.5 mL, Rfl: 5   [name] (CYANOCOBALAMIN) 500 MCG tablet, Take 500 mcg by mouth daily., Disp: , Rfl:    Vitamin D, Ergocalciferol, (DRISDOL) 1.25 MG (50000 UNIT) CAPS capsule, Take 1 capsule (50,000 Units total) by mouth every 3 (three) days. (Patient not taking: Reported on 02/03/2022), Disp: 30 capsule, Rfl: 0   zinc gluconate 50 MG tablet, Take 50 mg by mouth daily., Disp: , Rfl:   Observations/Objective: Patient is well-developed, well-nourished in no acute distress.  Resting comfortably at home.  Head is normocephalic, atraumatic.  No labored breathing. Speech is clear and coherent with logical content.  Patient is alert and oriented at baseline.   Assessment and Plan: 1. Gastroenteritis  Mild but persistent.  No alarm symptoms present.  Supportive measures and OTC medications reviewed.  Recommend she start a daily probiotic.  Brat diet reviewed and handout given.  Imodium OTC.  Will need  in person follow-up if not improving/resolving over the next few days.  Follow Up Instructions: I discussed the assessment and treatment plan with the patient. The patient was provided an opportunity to ask questions and all were answered. The patient agreed with the plan and demonstrated an understanding of the instructions.  A copy of instructions were sent to the patient via MyChart unless otherwise noted below.   The patient was advised to call back or seek an in-person evaluation if the symptoms worsen or if the condition fails to improve as anticipated.  Time:  I spent 10 minutes with the patient via telehealth technology discussing the above problems/concerns.    [name], PA-C

## 2022-08-26 NOTE — Patient Instructions (Signed)
[name], thank you for joining [name], PA-C for today's virtual visit.  While this provider is not your primary care provider (PCP), if your PCP is located in our provider database this encounter information will be shared with them immediately following your visit.   [facility] account gives you access to today's visit and all your visits, tests, and labs performed at [facility] " click here if you don't have a [facility] account or go to [email or web address]  Consent: (Patient) Ann Woods provided verbal consent for this virtual visit at the beginning of the encounter.  Current Medications:  Current Outpatient Medications:    albuterol (VENTOLIN HFA) 108 (90 Base) MCG/ACT inhaler, Inhale 2 puffs into the lungs every 6 (six) hours as needed for wheezing or shortness of breath., Disp: 18 g, Rfl: 0   amLODipine-benazepril (LOTREL) 5-10 MG capsule, Take 1 capsule by mouth daily., Disp: 30 capsule, Rfl: 0   azelastine (ASTELIN) 0.1 % nasal spray, Use 1 spray in each nostril twice daily, Disp: 30 mL, Rfl: 5   azelastine (OPTIVAR) 0.05 % ophthalmic solution, Use 1 drop in each eye twice daily as needed (Patient not taking: Reported on 02/03/2022), Disp: 6 mL, Rfl: 5   cetirizine (ZYRTEC ALLERGY) 10 MG tablet, Take 1 tablet (10 mg total) by mouth 2 (two) times daily., Disp: 60 tablet, Rfl: 5   famotidine (PEPCID) 20 MG tablet, Take 1 tablet (20 mg total) by mouth 2 (two) times daily., Disp: 60 tablet, Rfl: 5   ferrous sulfate 325 (65 FE) MG tablet, Take 1 tablet (325 mg total) by mouth daily with breakfast. (Patient not taking: Reported on 02/03/2022), Disp: 30 tablet, Rfl: 0   fluticasone (FLONASE) 50 MCG/ACT nasal spray, Place 2 sprays into both nostrils daily., Disp: 16 mL, Rfl: 0   levocetirizine (XYZAL) 5 MG tablet, Take 5 mg by mouth every evening. (Patient not taking: Reported on 02/03/2022), Disp: , Rfl:    methocarbamol  (ROBAXIN) 500 MG tablet, Take 1 tablet (500 mg total) by mouth 4 (four) times daily. (Patient not taking: Reported on 02/03/2022), Disp: 30 tablet, Rfl: 0   montelukast (SINGULAIR) 10 MG tablet, Take 1 tablet (10 mg total) by mouth at bedtime. (Patient not taking: Reported on 02/03/2022), Disp: 30 tablet, Rfl: 5   Olopatadine HCl (PAZEO) 0.7 % SOLN, Place 1 drop into both eyes daily. (Patient not taking: Reported on 02/03/2022), Disp: 2.5 mL, Rfl: 5   [name] (CYANOCOBALAMIN) 500 MCG tablet, Take 500 mcg by mouth daily., Disp: , Rfl:    Vitamin D, Ergocalciferol, (DRISDOL) 1.25 MG (50000 UNIT) CAPS capsule, Take 1 capsule (50,000 Units total) by mouth every 3 (three) days. (Patient not taking: Reported on 02/03/2022), Disp: 30 capsule, Rfl: 0   zinc gluconate 50 MG tablet, Take 50 mg by mouth daily., Disp: , Rfl:    Medications ordered in this encounter:  No orders of the defined types were placed in this encounter.    *If you need refills on other medications prior to your next appointment, please contact your pharmacy*  Follow-Up: Call back or seek an in-person evaluation if the symptoms worsen or if the condition fails to improve as anticipated.  [place] [phone]  Other Instructions  Please keep well-hydrated and get plenty of rest. Follow the dietary recommendations below. You can use Imodium over-the-counter. Start a daily probiotic. If symptoms or not easing up over the next few days or you  note any new or worsening symptoms, you need to seek an in person evaluation. Please do not delay care.  Food Choices to Help Relieve Diarrhea, Adult Diarrhea can make you feel weak and cause you to become dehydrated. Dehydration is a condition in which there is not enough water or other fluids in the body. It is important to choose the right foods and drinks to: Relieve diarrhea. Replace lost fluids and nutrients. Prevent dehydration. What are tips for following this  plan? Relieving diarrhea Avoid foods that make your diarrhea worse. These may include: Foods and drinks that are sweetened with high-fructose corn syrup, honey, or sweeteners such as xylitol, sorbitol, and mannitol. Check food labels for these ingredients. Fried, greasy, or spicy foods. Raw fruits and vegetables. Eat foods that are rich in probiotics. These include foods such as yogurt and fermented milk products. Probiotics can help increase healthy bacteria in your stomach and intestines (gastrointestinal or GI tract). This may help digestion and stop diarrhea. If you have lactose intolerance, avoid dairy products. These may make your diarrhea worse. Take medicine to help stop diarrhea only as told by your health care provider. Replacing nutrients  Eat bland, easy-to-digest foods in small amounts as you are able, until your diarrhea starts to get better. These foods include bananas, applesauce, rice, toast, and crackers. Over time, add nutrient-rich foods as your body tolerates them or as told by your health care provider. These include: Well-cooked protein foods, such as eggs, lean meats like fish or chicken without skin, and tofu. Peeled, seeded, and soft-cooked fruits and vegetables. Low-fat dairy products. Whole grains. Take vitamin and mineral supplements as told by your health care provider. Preventing dehydration  Start by sipping water or a solution to prevent dehydration (oral rehydration solution, or ORS). This is a drink that helps replace fluids and minerals your body has lost. You can buy an ORS at pharmacies and retail stores. Try to drink at least 8-10 cups (2,000-2,500 mL) of fluid each day to help replace lost fluids. If your urine is pale yellow, you are getting enough fluids. You may drink other liquids in addition to water, such as fruit juice that you have added water to (diluted fruit juice) or low-calorie sports drinks, as tolerated or as told by your health care  provider. Avoid drinks with caffeine, such as coffee, tea, or soft drinks. Avoid alcohol. This information is not intended to replace advice given to you by your health care provider. Make sure you discuss any questions you have with your health care provider. Document Revised: 01/28/2022 Document Reviewed: 01/28/2022 Elsevier Patient Education  [place].    If you have been instructed to have an in-person evaluation today at a local Urgent Care facility, please use the link below. It will take you to a list of all of our available [facility], including address, phone number and hours of operation. Please do not delay care.  [facility]  If you or a family member do not have a primary care provider, use the link below to schedule a visit and establish care. When you choose a [facility] primary care physician or advanced practice provider, you gain a long-term partner in health. Find a Primary Care Provider  Learn more about [facility]'s in-office and virtual care options: [facility]

## 2022-09-08 ENCOUNTER — Emergency Department (HOSPITAL_COMMUNITY): Payer: Managed Care, Other (non HMO)

## 2022-09-08 ENCOUNTER — Emergency Department (HOSPITAL_COMMUNITY)
Admission: EM | Admit: 2022-09-08 | Discharge: 2022-09-09 | Disposition: A | Discharge status: Home / Self Care | Payer: Managed Care, Other (non HMO) | Attending: Emergency Medicine | Admitting: Emergency Medicine

## 2022-09-08 DIAGNOSIS — Z20822 Contact with and (suspected) exposure to covid-19: Secondary | ICD-10-CM | POA: Insufficient documentation

## 2022-09-08 DIAGNOSIS — Z87891 Personal history of nicotine dependence: Secondary | ICD-10-CM | POA: Insufficient documentation

## 2022-09-08 DIAGNOSIS — R059 Cough, unspecified: Secondary | ICD-10-CM | POA: Diagnosis present

## 2022-09-08 DIAGNOSIS — J101 Influenza due to other identified influenza virus with other respiratory manifestations: Secondary | ICD-10-CM | POA: Insufficient documentation

## 2022-09-08 MED ORDER — ACETAMINOPHEN 500 MG PO TABS
1000.0000 mg | ORAL_TABLET | ORAL | Status: AC
  Administered 2022-09-09: 1000 mg via ORAL
  Filled 2022-09-08: qty 2

## 2022-09-08 MED ORDER — IPRATROPIUM-ALBUTEROL 0.5-2.5 (3) MG/3ML IN SOLN: 3.0000 mL | Freq: Once | RESPIRATORY_TRACT | Status: DC

## 2022-09-08 NOTE — ED Provider Triage Note (Signed)
Emergency Medicine Provider Triage Evaluation Note  Ann Woods , a 47 y.o. female  was evaluated in triage.  Pt complains of cough, nasal congestion, shortness of breath, fever since Friday. 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 Endorses nausea and vomiting, diarrhea.  Review of Systems  Positive: As above Negative: As above  Physical Exam  BP (!) 186/113   Pulse 90   Temp 99.9 F (37.7 C) (Oral)   Resp (!) 24   SpO2 94%  Gen:   Awake, no distress   Resp:  Normal effort  MSK:   Moves extremities without difficulty  Other:    Medical Decision Making  Medically screening exam initiated at 6:11 PM.  Appropriate orders placed.  Ann Woods was informed that the remainder of the evaluation will be completed by another provider, this initial triage assessment does not replace that evaluation, and the importance of remaining in the ED until their evaluation is complete.    Rex Kras, [place] 09/08/22 2119

## 2022-09-08 NOTE — ED Triage Notes (Signed)
Patient complains of emesis, cough, diarrhea, dizziness, and shortness of breath that started Saturday night. Room air SpO2 94%. Patient coughing multiple times while trying to explain symptoms but when not coughing speaks in complete symptoms. Patient is alert, oriented, and in no apparent distress at this time.

## 2022-09-09 LAB — CBC WITH DIFFERENTIAL/PLATELET
Abs Immature Granulocytes: 0.01 10*3/uL (ref 0.00–0.07)
Basophils Absolute: 0 10*3/uL (ref 0.0–0.1)
Basophils Relative: 1 %
Eosinophils Absolute: 0 10*3/uL (ref 0.0–0.5)
Eosinophils Relative: 0 %
HCT: 37.6 % (ref 36.0–46.0)
Hemoglobin: 11.9 g/dL — ABNORMAL LOW (ref 12.0–15.0)
Immature Granulocytes: 0 %
Lymphocytes Relative: 20 %
Lymphs Abs: 0.7 10*3/uL (ref 0.7–4.0)
MCH: 27 pg (ref 26.0–34.0)
MCHC: 31.6 g/dL (ref 30.0–36.0)
MCV: 85.5 fL (ref 80.0–100.0)
Monocytes Absolute: 0.4 10*3/uL (ref 0.1–1.0)
Monocytes Relative: 11 %
Neutro Abs: 2.5 10*3/uL (ref 1.7–7.7)
Neutrophils Relative %: 68 %
Platelets: 186 10*3/uL (ref 150–400)
RBC: 4.4 MIL/uL (ref 3.87–5.11)
RDW: 15.7 % — ABNORMAL HIGH (ref 11.5–15.5)
WBC: 3.6 10*3/uL — ABNORMAL LOW (ref 4.0–10.5)
nRBC: 0 % (ref 0.0–0.2)

## 2022-09-09 LAB — RESP PANEL BY RT-PCR (RSV, FLU A&B, COVID)  RVPGX2
Influenza A by PCR: POSITIVE — AB
Influenza B by PCR: NEGATIVE
Resp Syncytial Virus by PCR: NEGATIVE
SARS Coronavirus 2 by RT PCR: NEGATIVE

## 2022-09-09 LAB — BASIC METABOLIC PANEL
Anion gap: 7 (ref 5–15)
BUN: 9 mg/dL (ref 6–20)
CO2: 27 mmol/L (ref 22–32)
Calcium: 8.6 mg/dL — ABNORMAL LOW (ref 8.9–10.3)
Chloride: 99 mmol/L (ref 98–111)
Creatinine, Ser: 0.85 mg/dL (ref 0.44–1.00)
GFR, Estimated: >60 mL/min (ref >60)
Glucose, Bld: 94 mg/dL (ref 70–99)
Potassium: 4 mmol/L (ref 3.5–5.1)
Sodium: 133 mmol/L — ABNORMAL LOW (ref 135–145)

## 2022-09-09 MED ORDER — BENZONATATE 100 MG PO CAPS: 100.0000 mg | ORAL_CAPSULE | Freq: Three times a day (TID) | ORAL | 0 refills | Status: AC | PRN

## 2022-09-09 MED ORDER — ONDANSETRON HCL 8 MG PO TABS: 8.0000 mg | ORAL_TABLET | Freq: Three times a day (TID) | ORAL | 0 refills | Status: AC | PRN

## 2022-09-09 MED ORDER — ONDANSETRON 4 MG PO TBDP
8.0000 mg | ORAL_TABLET | Freq: Once | ORAL | Status: AC
  Administered 2022-09-09: 8 mg via ORAL
  Filled 2022-09-09: qty 2

## 2022-09-09 MED ORDER — SODIUM CHLORIDE 0.9 % IV SOLN: Freq: Once | INTRAVENOUS | Status: AC

## 2022-09-09 NOTE — ED Provider Notes (Signed)
[place] EMERGENCY DEPARTMENT Provider Note   CSN: [id number] Arrival date & time: 09/08/22  1644     History  Chief Complaint  Patient presents with   Emesis   [facility] is a 47 y.o. female PMH of chronic viral hepatitis B, pre diabetes, anemia here for post-tussive emesis, nausea, cough, nasal drainage, lightheadedness that started Saturday night.  She says that on Saturday night she started feeling lightheaded when she is standing.  She says that she had episodes of emesis and diarrhea on Saturday.  She said her emesis had a light tinge of blood in it and she said it happened after large bouts of coughing.  She does feel nauseous currently and has had decreased p.o. intake in terms of eating and drinking.  She had a fever to 102 Tmax yesterday, has not had a fever today (last fever 100.5 at 2153 1/15).  She has been taking Robitussin and Tylenol at home for relief.  She feels like she is having more concentrated urine and less urine output than her usual.  She has a little bit of a headache in the front of her head.  She denies any history of COPD or asthma although she says that she is being worked up for COPD although she says she stopped smoking when she was very young at 47 years old she said her father had COPD. Per pulmonology notes appear DOE related to body habitus. She does have albuterol to use at home and she has been having to use it more often due to shortness of breath since Saturday.  She does complain of pleuritic chest pain when she is coughing and it is tender to palpation when she presses on her anterior chest.  Emesis Associated symptoms: cough and fever   Associated symptoms: no abdominal pain, no arthralgias and no sore throat        Home Medications Prior to Admission medications   Medication Sig Start Date End Date Taking? Authorizing Provider  benzonatate (TESSALON PERLES) 100 MG capsule Take 1 capsule (100 mg total) by mouth 3  (three) times daily as needed for cough. 09/09/22  Yes Gerrit Heck, MD  ondansetron (ZOFRAN) 8 MG tablet Take 1 tablet (8 mg total) by mouth every 8 (eight) hours as needed for nausea or vomiting. 09/09/22  Yes Gerrit Heck, MD  albuterol (VENTOLIN HFA) 108 (90 Base) MCG/ACT inhaler Inhale 2 puffs into the lungs every 6 (six) hours as needed for wheezing or shortness of breath. 07/31/20   Henson, Vickie L, NP-C  amLODipine-benazepril (LOTREL) 5-10 MG capsule Take 1 capsule by mouth daily. 09/01/19   Whitmire, Joneen Boers, FNP  azelastine (ASTELIN) 0.1 % nasal spray Use 1 spray in each nostril twice daily 01/21/19   Kennith Gain, MD  azelastine (OPTIVAR) 0.05 % ophthalmic solution Use 1 drop in each eye twice daily as needed Patient not taking: Reported on 02/03/2022 01/21/19   Kennith Gain, MD  cetirizine (ZYRTEC ALLERGY) 10 MG tablet Take 1 tablet (10 mg total) by mouth 2 (two) times daily. 08/10/19   Kennith Gain, MD  famotidine (PEPCID) 20 MG tablet Take 1 tablet (20 mg total) by mouth 2 (two) times daily. 08/10/19   Kennith Gain, MD  ferrous sulfate 325 (65 FE) MG tablet Take 1 tablet (325 mg total) by mouth daily with breakfast. Patient not taking: Reported on 02/03/2022 02/11/18 08/09/28  Elodia Florence., MD  fluticasone [facility]) 50 MCG/ACT nasal spray  Place 2 sprays into both nostrils daily. 02/08/21   Hughie Closs, PA-C  levocetirizine (XYZAL) 5 MG tablet Take 5 mg by mouth every evening. Patient not taking: Reported on 02/03/2022    [provider]  methocarbamol (ROBAXIN) 500 MG tablet Take 1 tablet (500 mg total) by mouth 4 (four) times daily. Patient not taking: Reported on 02/03/2022 09/01/19   Harland Dingwall L, NP-C  montelukast (SINGULAIR) 10 MG tablet Take 1 tablet (10 mg total) by mouth at bedtime. Patient not taking: Reported on 02/03/2022 03/10/19   Kennith Gain, MD  Olopatadine HCl (PAZEO) 0.7 % SOLN  Place 1 drop into both eyes daily. Patient not taking: Reported on 02/03/2022 03/10/19   Kennith Gain, MD  [name] (CYANOCOBALAMIN) 500 MCG tablet Take 500 mcg by mouth daily.    [provider]  Vitamin D, Ergocalciferol, (DRISDOL) 1.25 MG (50000 UNIT) CAPS capsule Take 1 capsule (50,000 Units total) by mouth every 3 (three) days. Patient not taking: Reported on 02/03/2022 02/11/21   Whitmire, Joneen Boers, FNP  zinc gluconate 50 MG tablet Take 50 mg by mouth daily.    [provider]      Allergies    Patient has no known allergies.    Review of Systems   Review of Systems  Constitutional:  Positive for activity change, appetite change and fever.  HENT:  Positive for ear pain and rhinorrhea. Negative for sore throat.   Eyes:  Negative for pain and visual disturbance.  Respiratory:  Positive for cough and shortness of breath.   Cardiovascular:  Negative for chest pain and palpitations.  Gastrointestinal:  Positive for vomiting. Negative for abdominal pain.  Genitourinary:  Negative for dysuria and hematuria.  Musculoskeletal:  Negative for arthralgias and back pain.  Skin:  Negative for color change and rash.  Neurological:  Negative for seizures and syncope.  All other systems reviewed and are negative.   Physical Exam Updated Vital Signs BP (!) 151/91   Pulse 76   Temp 98.2 F (36.8 C) (Oral)   Resp (!) 21   SpO2 99%  Physical Exam Vitals and nursing note reviewed.  Constitutional:      General: She is not in acute distress.    Appearance: She is well-developed.  HENT:     Head: Normocephalic and atraumatic.     Nose: Congestion present.  Eyes:     Conjunctiva/sclera: Conjunctivae normal.  Cardiovascular:     Rate and Rhythm: Normal rate and regular rhythm.     Pulses: Normal pulses.     Heart sounds: No murmur heard. Pulmonary:     Effort: Pulmonary effort is normal. No respiratory distress.     Breath sounds: Normal breath sounds.   Abdominal:     General: There is no distension.     Palpations: Abdomen is soft.     Tenderness: There is abdominal tenderness. There is no right CVA tenderness, left CVA tenderness, guarding or rebound.     Comments: Mild epigastric tenderness on exam  Musculoskeletal:        General: No swelling.     Cervical back: Neck supple.  Skin:    General: Skin is warm and dry.     Capillary Refill: Capillary refill takes less than 2 seconds.  Neurological:     General: No focal deficit present.     Mental Status: She is alert.  Psychiatric:        Mood and Affect: Mood normal.  ED Results / Procedures / Treatments   Labs  Labs Reviewed  RESP PANEL BY RT-PCR (RSV, FLU A&B, COVID)  RVPGX2 - Abnormal; Notable for the following components:      Result Value   Influenza A by PCR POSITIVE (*)    All other components within normal limits  BASIC METABOLIC PANEL - Abnormal; Notable for the following components:   Sodium 133 (*)    Calcium 8.6 (*)    All other components within normal limits  CBC WITH DIFFERENTIAL/PLATELET - Abnormal; Notable for the following components:   WBC 3.6 (*)    Hemoglobin 11.9 (*)    RDW 15.7 (*)    All other components within normal limits    EKG EKG Interpretation  Date/Time:  Monday September 08 2022 17:24:24 EST Ventricular Rate:  85 PR Interval:  152 QRS Duration: 80 QT Interval:  346 QTC Calculation: 411 R Axis:   74 Text Interpretation: Normal sinus rhythm Normal ECG Confirmed by Carmin Muskrat [phone]) on 09/09/2022 8:16:21 AM  Radiology DG Chest 2 View  Result Date: 09/08/2022 CLINICAL DATA:  Shortness of breath EXAM: CHEST - 2 VIEW COMPARISON:  02/08/2021 x-ray FINDINGS: Enlarged cardiopericardial silhouette. Diffuse vascular congestion. No consolidation, pneumothorax or effusion. Degenerative changes seen along the spine. Films are under penetrated. IMPRESSION: Enlarged cardiopericardial silhouette with vascular congestion. Electronically  Signed   By: Jill Side M.D.   On: 09/08/2022 19:11    Procedures Procedures   Medications Ordered in ED Medications  acetaminophen (TYLENOL) tablet 1,000 mg (1,000 mg Oral Given 09/09/22 0103)  0.9 %  sodium chloride infusion ( Intravenous New Bag/Given 09/09/22 0909)  ondansetron (ZOFRAN-ODT) disintegrating tablet 8 mg (8 mg Oral Given 09/09/22 0859)    ED Course/ Medical Decision Making/ A&P  :1}                          Medical Decision Making 47 year old female here with posttussive emesis, nausea, cough, lightheadedness that started since Saturday night likely related to positive Flu A swab here in ED. CBC with no anemia or leukocytosis. CMP without acute electrolyte derangements.  Chest x-ray obtained due to shortness of breath which showed some vascular congestion and cardiomegaly but no acute pneumonia.  Low likelihood of heart failure exacerbation given patient's last echo was normal in 2019, no noticeable lower extremity edema on examination. No increased work of breathing on my examination.   Chest pain is reproducible on examination, EKG normal on examination, less likely to be cardiac in nature.  Chest x-ray with no findings of pneumomediastinum.  Most likely musculoskeletal in the setting of emesis. Given patient had decreased p.o. intake, 500 mL normal saline bolus given to patient.  Zofran ODT for nausea control given as well.  P.o. trial in ED with success.  She is outside of Tamiflu window.  Discharged home with symptomatic treatment with Tessalon Perles, Zofran, as needed at home.       Final Clinical Impression(s) / ED Diagnoses Final diagnoses:  Influenza A    Rx / DC Orders ED Discharge Orders          Ordered    ondansetron (ZOFRAN) 8 MG tablet  Every 8 hours PRN        09/09/22 1032    benzonatate (TESSALON PERLES) 100 MG capsule  3 times daily PRN        09/09/22 1032  Gerrit Heck, MD 09/09/22 1101    Carmin Muskrat,  MD 09/18/22 [phone]

## 2022-09-09 NOTE — Discharge Instructions (Addendum)
You were diagnosed with influenza A, this is likely causing your problems with nausea/vomiting I recommend hydrating well and eating as tolerated to help with the dizziness. You were given some fluids here today I am sending zofran for nausea you may take this every 8 hours as needed I am sending tessalon perles for cough I recommend continuing nasal spray daily Take your albuterol as needed for shortness of breath Please follow up with PCP if not improving

## 2022-09-10 ENCOUNTER — Telehealth: Payer: Self-pay | Admitting: Nurse Practitioner

## 2022-09-10 NOTE — Telephone Encounter (Signed)
Transition Care Management Unsuccessful Follow-up Telephone Call  Date of discharge and from where:  09/09/20[facility]  Attempts:  1st Attempt  Reason for unsuccessful TCM follow-up call:  Left voice message

## 2022-09-11 ENCOUNTER — Telehealth: Payer: Self-pay | Admitting: Nurse Practitioner

## 2022-09-11 ENCOUNTER — Telehealth: Payer: Managed Care, Other (non HMO) | Admitting: Family Medicine

## 2022-09-11 ENCOUNTER — Encounter: Payer: Self-pay | Admitting: Family Medicine

## 2022-09-11 VITALS — Temp 98.0°F | Ht 66.0 in | Wt 368.0 lb

## 2022-09-11 DIAGNOSIS — R051 Acute cough: Secondary | ICD-10-CM

## 2022-09-11 DIAGNOSIS — J101 Influenza due to other identified influenza virus with other respiratory manifestations: Secondary | ICD-10-CM | POA: Diagnosis not present

## 2022-09-11 DIAGNOSIS — R197 Diarrhea, unspecified: Secondary | ICD-10-CM

## 2022-09-11 NOTE — Telephone Encounter (Signed)
Transition Care Management Unsuccessful Follow-up Telephone Call  Date of discharge and from where:  09/09/20[facility]  Attempts:  2nd Attempt  Reason for unsuccessful TCM follow-up call:  Left voice message

## 2022-09-11 NOTE — Telephone Encounter (Signed)
Transition Care Management Follow-up Telephone Call Date of discharge and from where: 09/09/2022 [facility]  How have you been since you were released from the hospital? Pt called and states she feels awful  Any questions or concerns? Yes pt states she was diagnosed with Flu A and is not feeling any better at all   Items Reviewed: Did the pt receive and understand the discharge instructions provided? Yes  Medications obtained and verified? Yes pt states she was given bensonatate and zofran Other? No  Any new allergies since your discharge? No  Dietary orders reviewed? No Do you have support at home? No   Home Care and Equipment/Supplies: Were home health services ordered? not applicable  Follow up appointments reviewed:  PCP Hospital f/u appt confirmed? Yes  Scheduled to see Virtual Dr. Tomi Bamberger on 09/11/2022 @ 3:00.  Are transportation arrangements needed? No  If their condition worsens, is the pt aware to call PCP or go to the Emergency Dept.? Yes Was the patient provided with contact information for the PCP's office or ED? Yes Was to pt encouraged to call back with questions or concerns? Yes

## 2022-09-11 NOTE — Progress Notes (Signed)
Start time: 3:13 End time:  3:35  Virtual Visit via Video Note  I connected with [facility] on 09/11/22 by a video enabled telemedicine application and verified that I am speaking with the correct person using two identifiers.  Location: Patient: home Provider: office   I discussed the limitations of evaluation and management by telemedicine and the availability of in person appointments. The patient expressed understanding and agreed to proceed.  History of Present Illness:  Chief Complaint  Patient presents with   Influenza    VIRTUAL positive flu on 09/09/22. Feeling a little bit better. Her body is still aching, she is still really dizzy. Walking really slowly because she is afraid to fall. Coughing and still congested. No fever, some chills.    Seen in ER on 1/15, diagnosed with influenza A Patient reports her illness started on 1/14.  According to ER notes, sx started Saturday (1/13) with fever, dizziness, vomiting and diarrhea. +post-tussive emesis. She was felt to be out of the window for treatment with tamiflu (d/c'd from ER on 1/16)  EKG, CXR--Enlarged cardiopericardial silhouette with vascular congestion.  No consolidation, pneumothorax or effusion (underpenetrated films) (Old x-rays reviewed, reports of borderline cardiomegaly noted as stable in 02/2018; pt recalls being told of enlarged heart with pneumonia in the past).  ER labs:  Lab Results  Component Value Date   WBC 3.6 (L) 09/09/2022   HGB 11.9 (L) 09/09/2022   HCT 37.6 09/09/2022   MCV 85.5 09/09/2022   PLT 186 09/09/2022     Chemistry      Component Value Date/Time   NA 133 (L) 09/09/2022 0042   NA 143 03/04/2021 0908   K 4.0 09/09/2022 0042   CL 99 09/09/2022 0042   CO2 27 09/09/2022 0042   BUN 9 09/09/2022 0042   BUN 4 (L) 03/04/2021 0908   CREATININE 0.85 09/09/2022 0042   CREATININE 0.76 03/11/2021 0938      Component Value Date/Time   CALCIUM 8.6 (L) 09/09/2022 0042   ALKPHOS 98  03/04/2021 0908   AST 18 03/26/2022 0943   ALT 13 03/26/2022 0943   ALT 15 06/17/2018 0939   BILITOT 0.7 03/26/2022 0943   BILITOT 0.8 03/04/2021 0908     She was discharged from the ED with rx for zofran '8mg'$  and tessalon perles.  Patient states today she feels "like someone's been boxing me upside the head." Fever broke yesterday. Still having chills. She reports some shortness of breath, cough. Hasn't tried albuterol (has at home)--no known asthma, rx'd with prior pna. She reports that Benzonatate helps some with her cough. Also taking Tussin DM.  Phlegm is clear, slightly yellow.  Sometimes cough is nonproductive. At one point it had a tinge of blood.  Some diarrhea--loose, not frequent. No vomiting since the ER.  She did have nausea, took zofran this morning. Denies nausea currently.  She did not get a flu shot. 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 She states that the work note she got from the ER said to RTW tomorrow. She is asking for more time off from work, she feels terrible.   PMH, PSH, SH reviewed  Outpatient Encounter Medications as of 09/11/2022  Medication Sig Note   acetaminophen (TYLENOL) 500 MG tablet Take 1,000 mg by mouth every 6 (six) hours as needed. 09/11/2022: Last dose 10:00   amLODipine-benazepril (LOTREL) 5-10 MG capsule Take 1 capsule by mouth daily.    benzonatate (TESSALON PERLES) 100 MG capsule Take  1 capsule (100 mg total) by mouth 3 (three) times daily as needed for cough. 09/11/2022: Last dose 10:00am   dextromethorphan 15 MG/5ML syrup Take 10 mLs by mouth 4 (four) times daily as needed for cough. 09/11/2022: Taking tussin DM (guaifenesin AND dextromethorphan)   ketoconazole (NIZORAL) 2 % cream Apply 1 Application topically as needed.    ondansetron (ZOFRAN) 8 MG tablet Take 1 tablet (8 mg total) by mouth every 8 (eight) hours as needed for nausea or vomiting. 09/11/2022: Last dose at 10:00am   triamcinolone cream (KENALOG) 0.1 % Apply  1 Application topically as needed.    albuterol (VENTOLIN HFA) 108 (90 Base) MCG/ACT inhaler Inhale 2 puffs into the lungs every 6 (six) hours as needed for wheezing or shortness of breath. (Patient not taking: Reported on 09/11/2022) 09/11/2022: prn   azelastine (ASTELIN) 0.1 % nasal spray Use 1 spray in each nostril twice daily (Patient not taking: Reported on 09/11/2022) 09/11/2022: As needed   fluticasone (FLONASE) 50 MCG/ACT nasal spray Place 2 sprays into both nostrils daily. (Patient not taking: Reported on 09/11/2022) 09/11/2022: prn   [name] (CYANOCOBALAMIN) 500 MCG tablet Take 500 mcg by mouth daily. (Patient not taking: Reported on 09/11/2022) 02/08/2021: As needed per pt   zinc gluconate 50 MG tablet Take 50 mg by mouth daily. (Patient not taking: Reported on 09/11/2022) 02/08/2021: As needed per pt   [DISCONTINUED] azelastine (OPTIVAR) 0.05 % ophthalmic solution Use 1 drop in each eye twice daily as needed (Patient not taking: Reported on 02/03/2022)    [DISCONTINUED] cetirizine (ZYRTEC ALLERGY) 10 MG tablet Take 1 tablet (10 mg total) by mouth 2 (two) times daily.    [DISCONTINUED] famotidine (PEPCID) 20 MG tablet Take 1 tablet (20 mg total) by mouth 2 (two) times daily.    [DISCONTINUED] ferrous sulfate 325 (65 FE) MG tablet Take 1 tablet (325 mg total) by mouth daily with breakfast. (Patient not taking: Reported on 02/03/2022)    [DISCONTINUED] levocetirizine (XYZAL) 5 MG tablet Take 5 mg by mouth every evening. (Patient not taking: Reported on 02/03/2022) 02/08/2021: Pt denies taking   [DISCONTINUED] methocarbamol (ROBAXIN) 500 MG tablet Take 1 tablet (500 mg total) by mouth 4 (four) times daily. (Patient not taking: Reported on 02/03/2022)    [DISCONTINUED] montelukast (SINGULAIR) 10 MG tablet Take 1 tablet (10 mg total) by mouth at bedtime. (Patient not taking: Reported on 02/03/2022)    [DISCONTINUED] Olopatadine HCl (PAZEO) 0.7 % SOLN Place 1 drop into both eyes daily. (Patient not taking:  Reported on 02/03/2022)    [DISCONTINUED] Vitamin D, Ergocalciferol, (DRISDOL) 1.25 MG (50000 UNIT) CAPS capsule Take 1 capsule (50,000 Units total) by mouth every 3 (three) days. (Patient not taking: Reported on 02/03/2022)    No facility-administered encounter medications on file as of 09/11/2022.   No Known Allergies  ROS: +f/c, n/v/d and URI symptoms per HPI. Currently with HA, cough, body aches, fatigue, dizziness. Fever broke.  Some shortness of breath.  No chest pain, palpitations.  No rash.  +fatigue. See HPI    Observations/Objective:  Temp 29 F (36.7 C) (Temporal)   Ht '5\' 6"'$  (1.676 m)   Wt (!) 368 lb (166.9 kg)   BMI 59.40 kg/m   Mildly-ill/fatigued appearing female, in no acute distress. No coughing during the visit She is alert and oriented. Cranial nerves are grossly intact. Exam is limited due to the virtual nature of the visit.  Assessment and Plan:  Influenza A - reviewed supportive measures. reviewed s/sx complications for  which she should be re-evaluated (in-person)  Diarrhea, unspecified type - reviewed BRAT diet, advance as tolerated, bland, avoid dairy  Acute cough - due to influenza. Cont tessalon prn; increase frequency of tussin DM (take as directed). Can use albuterol prn for wheezing/SOB.  Note to RTW Monday   Stay well hydrated, drink plenty of water (goal is to have clear or very pale yellow urine). Stick with a bland diet--avoid dairy, spicy and fried/greasy foods.   Chicken soup, bananas, rice, applesauce and toast are easy foods that shouldn't make the diarrhea worse, so try eating these if your appetite improves. Avoid all dairy until your stomach is better for at least 5 days. Use the tussin DM as directed on the package (every 4-6 hours)--this has a cough suppressant and an expectorant to help loosen up the mucus and phlegm. Continue with the benzonatate three times daily (if needed, for cough). Try using the albuterol if you feel tight in  the chest and short of breath. You may use tylenol and/or ibuprofen (400-600 mg, WITH FOOD) if needed for fever or pain.  Return to see [place] if you have persistent cough, discolored phlegm, recurrent fever, worsening shortness of breath, pain with breathing.   Follow Up Instructions:    I discussed the assessment and treatment plan with the patient. The patient was provided an opportunity to ask questions and all were answered. The patient agreed with the plan and demonstrated an understanding of the instructions.   The patient was advised to call back or seek an in-person evaluation if the symptoms worsen or if the condition fails to improve as anticipated.  I spent 25 minutes dedicated to the care of this patient, including pre-visit review of records, face to face time, post-visit ordering of testing and documentation.    Vikki Ports, MD

## 2022-09-11 NOTE — Patient Instructions (Signed)
Stay well hydrated, drink plenty of water (goal is to have clear or very pale yellow urine). Stick with a bland diet--avoid dairy, spicy and fried/greasy foods.   Chicken soup, bananas, rice, applesauce and toast are easy foods that shouldn't make the diarrhea worse, so try eating these if your appetite improves. Avoid all dairy until your stomach is better for at least 5 days. Use the tussin DM as directed on the package (every 4-6 hours)--this has a cough suppressant and an expectorant to help loosen up the mucus and phlegm. Continue with the benzonatate three times daily (if needed, for cough). Try using the albuterol if you feel tight in the chest and short of breath. You may use tylenol and/or ibuprofen (400-600 mg, WITH FOOD) if needed for fever or pain.  Return to see [place] if you have persistent cough, discolored phlegm, recurrent fever, worsening shortness of breath, pain with breathing.

## 2022-11-03 ENCOUNTER — Ambulatory Visit: Payer: Managed Care, Other (non HMO) | Admitting: Family Medicine

## 2023-02-02 ENCOUNTER — Ambulatory Visit
Admission: RE | Admit: 2023-02-02 | Discharge: 2023-02-02 | Disposition: A | Discharge status: Home / Self Care | Payer: Managed Care, Other (non HMO) | Source: Ambulatory Visit | Attending: Internal Medicine | Admitting: Internal Medicine

## 2023-02-02 ENCOUNTER — Other Ambulatory Visit: Payer: Managed Care, Other (non HMO)

## 2023-02-02 DIAGNOSIS — B181 Chronic viral hepatitis B without delta-agent: Secondary | ICD-10-CM

## 2023-02-04 ENCOUNTER — Other Ambulatory Visit: Payer: Managed Care, Other (non HMO)

## 2023-05-04 NOTE — Progress Notes (Unsigned)
PATIENT: Ann Woods DOB: 1976/04/29  REASON FOR VISIT: follow up HISTORY FROM: patient  No chief complaint on file.    HISTORY OF PRESENT ILLNESS:  05/04/23 ALL:  Ann Woods returns for follow up for OSA on CPAP. She was last seen 05/2021 and having difficulty meeting compliance due to falling asleep prior to starting therapy. She did note benefit of using therapy and was advised to follow up in 4-5 months. Since,     05/27/21 ALL (Mychart):  Ann Woods returns for follow up for OSA on CPAP. She has been struggling to meet compliance goals of at least 70%. Last visit 10/2020, she was doing fairly well and compliance was better. Since, she reports that compliance has fallen off a bit. She denies concerns with machine or supplies. She reports that she falls asleep before starting therapy. She does feel better when using CPAP consistently. She reports headaches are significantly better.     11/19/2020 ALL: She returns for CPAP follow up. She is doing well. She has improved compliance and feels she is sleeping better. She has noted air leaking around the top of her full face mask but continues to adjust at home. Leak has been acceptable. She denies concerns today.     05/24/2020 ALL:  Ann Woods is a 47 y.o. female here today for follow up for OSA on CPAP. She is doing better with CPAP therapy. She continues to have difficulty remembering to start therapy before going to sleep. She often drifts off to sleep while looking at her phone. She does recognize benefit of CPAP therapy. She has changed to a full face mask and feels it is a better fit for her.   Compliance report dated 04/23/2020 through 05/22/2020 reveals that she used CPAP 20 the past 30 days for compliance of 67%.  She used CPAP greater than 4 hours 16 of the past 30 days for compliance of 53%  Average usage on days used was 4 hours and 49 minutes.  Residual AHI was 2.0 on 7 to 14 cm of water and an EPR of 3.  There was a  leak noted in the 95th percentile of 23 L/min.  HISTORY: (copied from Dr Teofilo Pod note on 03/06/2020)  Ann Woods is a 47 year old right-handed woman with an underlying medical history of prediabetes, hypertension, hyperlipidemia, hepatitis B, anemia, seasonal allergies, lower extremity swelling and morbid obesity with a BMI of over 60, who Presents for follow-up consultation of his severe obstructive sleep apnea after interim home sleep testing and starting AutoPap therapy. The patient is unaccompanied today. I first met her on 11/10/2019 at the request of her primary care, at which time she reported snoring and excessive daytime somnolence. She was advised to proceed with sleep study testing. She had a home sleep test on 12/12/2019 which indicated severe obstructive sleep apnea with an AHI of 86.6/h, O2 nadir of 58%. She was advised to start AutoPap therapy.     Today, 02/20/2020: I reviewed her AutoPap compliance data from 01/17/2020 through 02/15/2020, which is a total of 30 days, during which time she used her machine only 16 days with percent use days greater than 4 hours at 13% only, indicating low compliance with an average usage of 3 hours and 10 minutes, residual AHI 2/h, 95th percentile pressure of 13 cm, range of 7 to 14 cm with EPR of 3, leak on the higher side with a 95th percentile at 17.9 L/min. Her set up date was 01/03/20. She reports  that she is still adjusting to treatment.  Her biggest challenge is to remember to put the machine on.  She has woken up and had pulled the mask off inadvertently as well.  She has noticed already some improvements including less daytime somnolence, less nocturia and less headaches when she uses the machine and in the past few days she has used it more consistently.  She tracks her usage through the app as well.  She is motivated to continue.  She is working on weight loss and is followed by the healthy weight and wellness clinic through [facility].   The patient's  allergies, current medications, family history, past medical history, past social history, past surgical history and problem list were reviewed and updated as appropriate.    Previously:    11/10/19: (She) reports snoring and excessive daytime somnolence.  I reviewed the office note from 10/31/2019.  Her Epworth sleepiness score is 15/24, fatigue severity score is 41/63. She has had occasional AM headaches. She has nocturia about 1/night. Bedtime is around 10, but she has difficulty falling asleep, may be awake till 1 AM. Rise time is 5:45 AM. She lives with her BF and 2 kids, ages 17 and 43. They have 1 dog in the the home. TV in the bedroom tends to stay on at night. She is a NS and does not drink alcohol, no daily caffeine.  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   REVIEW OF SYSTEMS: Out of a complete 14 system review of symptoms, the patient complains only of the following symptoms, fatigue and all other reviewed systems are negative.  ESS:14 FSS: 29  ALLERGIES: No Known Allergies  HOME MEDICATIONS: Outpatient Medications Prior to Visit  Medication Sig Dispense Refill   acetaminophen (TYLENOL) 500 MG tablet Take 1,000 mg by mouth every 6 (six) hours as needed.     albuterol (VENTOLIN HFA) 108 (90 Base) MCG/ACT inhaler Inhale 2 puffs into the lungs every 6 (six) hours as needed for wheezing or shortness of breath. (Patient not taking: Reported on 09/11/2022) 18 g 0   amLODipine-benazepril (LOTREL) 5-10 MG capsule Take 1 capsule by mouth daily. 30 capsule 0   azelastine (ASTELIN) 0.1 % nasal spray Use 1 spray in each nostril twice daily (Patient not taking: Reported on 09/11/2022) 30 mL 5   benzonatate (TESSALON PERLES) 100 MG capsule Take 1 capsule (100 mg total) by mouth 3 (three) times daily as needed for cough. 30 capsule 0   dextromethorphan 15 MG/5ML syrup Take 10 mLs by mouth 4 (four) times daily as needed for cough.     fluticasone (FLONASE) 50 MCG/ACT nasal spray Place 2  sprays into both nostrils daily. (Patient not taking: Reported on 09/11/2022) 16 mL 0   ketoconazole (NIZORAL) 2 % cream Apply 1 Application topically as needed.     ondansetron (ZOFRAN) 8 MG tablet Take 1 tablet (8 mg total) by mouth every 8 (eight) hours as needed for nausea or vomiting. 20 tablet 0   triamcinolone cream (KENALOG) 0.1 % Apply 1 Application topically as needed.     [name] (CYANOCOBALAMIN) 500 MCG tablet Take 500 mcg by mouth daily. (Patient not taking: Reported on 09/11/2022)     zinc gluconate 50 MG tablet Take 50 mg by mouth daily. (Patient not taking: Reported on 09/11/2022)     No facility-administered medications prior to visit.    PAST MEDICAL HISTORY: Past Medical History:  Diagnosis Date   Allergies    reports  allergies to dust, pollen, grass and trees   Anemia    Hepatitis B    High cholesterol    Hypertension    G2 - preeclampsia, CHTN   Infertility, female    Lactose intolerance    Obesity    Obesity    Prediabetes 04/01/2018   Seasonal allergies    Shortness of breath    Swelling of both lower extremities    Urticaria     PAST SURGICAL HISTORY: Past Surgical History:  Procedure Laterality Date   CESAREAN SECTION     HIP SURGERY     TUBAL LIGATION Bilateral 05/05/2014   Procedure: POST PARTUM TUBAL LIGATION;  Surgeon: Reva Bores, MD;  Location: [facility];  Service: Gynecology;  Laterality: Bilateral;    FAMILY HISTORY: Family History  Problem Relation Age of Onset   Heart disease Mother    Thyroid disease Mother    High blood pressure Mother    Obesity Mother    COPD Father    Allergic rhinitis Neg Hx    Angioedema Neg Hx    Asthma Neg Hx    Eczema Neg Hx    Immunodeficiency Neg Hx    Urticaria Neg Hx     SOCIAL HISTORY: Social History   Socioeconomic History   Marital status: Significant Other    Spouse name: Not on file   Number of children: Not on file   Years of education: Not on file   Highest education level: Not on  file  Occupational History   Occupation: Psychologist, counselling  Tobacco Use   Smoking status: Former    Current packs/day: 0.00    Types: Cigarettes    Start date: 02/23/1993    Quit date: 08/25/1993    Years since quitting: 29.7   Smokeless tobacco: Never   Tobacco comments:    one pack of cigarettes weekly- 05/27/18  Vaping Use   Vaping status: Never Used  Substance and Sexual Activity   Alcohol use: Not Currently   Drug use: No   Sexual activity: Yes    Birth control/protection: None  Other Topics Concern   Not on file  Social History Narrative   Not on file   Social Determinants of Health   Financial Resource Strain: Not on file  Food Insecurity: Not on file  Transportation Needs: Not on file  Physical Activity: Not on file  Stress: Not on file  Social Connections: Not on file  Intimate Partner Violence: Not on file      PHYSICAL EXAM  There were no vitals filed for this visit.  There is no height or weight on file to calculate BMI.  Generalized: Well developed, in no acute distress  Cardiology: normal rate and rhythm, no murmur noted Respiratory: clear to auscultation bilaterally  Neurological examination  Mentation: Alert oriented to time, place, history taking. Follows all commands speech and language fluent Cranial nerve II-XII: Pupils were equal round reactive to light. Extraocular movements were full, visual field were full  Motor: The motor testing reveals 5 over 5 strength of all 4 extremities. Good symmetric motor tone is noted throughout.  Gait and station: Gait is normal.   DIAGNOSTIC DATA (LABS, IMAGING, TESTING) - I reviewed patient records, labs, notes, testing and imaging myself where available.      No data to display           Lab Results  Component Value Date   WBC 3.6 (L) 09/09/2022   HGB 11.9 (L) 09/09/2022  HCT 37.6 09/09/2022   MCV 85.5 09/09/2022   PLT 186 09/09/2022      Component Value Date/Time   NA 133 (L) 09/09/2022  0042   NA 143 03/04/2021 0908   K 4.0 09/09/2022 0042   CL 99 09/09/2022 0042   CO2 27 09/09/2022 0042   GLUCOSE 94 09/09/2022 0042   BUN 9 09/09/2022 0042   BUN 4 (L) 03/04/2021 0908   CREATININE 0.85 09/09/2022 0042   CREATININE 0.76 03/11/2021 0938   CALCIUM 8.6 (L) 09/09/2022 0042   PROT 6.8 03/26/2022 0943   PROT 7.2 03/04/2021 0908   ALBUMIN 4.0 03/04/2021 0908   AST 18 03/26/2022 0943   ALT 13 03/26/2022 0943   ALT 15 06/17/2018 0939   ALKPHOS 98 03/04/2021 0908   BILITOT 0.7 03/26/2022 0943   BILITOT 0.8 03/04/2021 0908   GFRNONAA >60 09/09/2022 0042   GFRNONAA 109 03/12/2020 0924   GFRAA 122 08/06/2020 0950   GFRAA 126 03/12/2020 0924   Lab Results  Component Value Date   CHOL 157 03/04/2021   HDL 40 03/04/2021   LDLCALC 102 (H) 03/04/2021   TRIG 80 03/04/2021   CHOLHDL 3.3 03/02/2018   Lab Results  Component Value Date   HGBA1C 5.4 03/04/2021   Lab Results  Component Value Date   VITAMINB12 240 03/04/2021   Lab Results  Component Value Date   TSH 1.520 06/23/2019       ASSESSMENT AND PLAN 47 y.o. year old female  has a past medical history of Allergies, Anemia, Hepatitis B, High cholesterol, Hypertension, Infertility, female, Lactose intolerance, Obesity, Obesity, Prediabetes (04/01/2018), Seasonal allergies, Shortness of breath, Swelling of both lower extremities, and Urticaria. here with   No diagnosis found.    Lazaya continues to work on increasing CPAP compliance. Compliance now 80% daily and 63% 4 hour usage. She does note benefit with using CPAP therapy at home.  She was encouraged to use CPAP nightly and for greater than 4 hours each night.  She was advised to monitor for correct mask seal at home.  She will call me with any continued concerns.  Healthy lifestyle habits encouraged.  She will follow-up with me in 6 months, sooner if needed.  She verbalizes understanding and agreement with this plan.  No orders of the defined types were placed  in this encounter.    No orders of the defined types were placed in this encounter.      Shawnie Dapper, FNP-C 05/04/2023, 9:56 AM [name] [address] [place], [place] [address] [phone]

## 2023-05-04 NOTE — Patient Instructions (Incomplete)
Please continue using your CPAP regularly. While your insurance requires that you use CPAP at least 4 hours each night on 70% of the nights, I recommend, that you not skip any nights and use it throughout the night if you can. Getting used to CPAP and staying with the treatment long term does take time and patience and discipline. Untreated obstructive sleep apnea when it is moderate to severe can have an adverse impact on cardiovascular health and raise her risk for heart disease, arrhythmias, hypertension, congestive heart failure, stroke and diabetes. Untreated obstructive sleep apnea causes sleep disruption, nonrestorative sleep, and sleep deprivation. This can have an impact on your day to day functioning and cause daytime sleepiness and impairment of cognitive function, memory loss, mood disturbance, and problems focussing. Using CPAP regularly can improve these symptoms.  We will update supply orders, today. I will ask [facility] to do a mask refitting. Please focus on using CPAP every night for at least 4 hours. Keep an eye on your BP. Schedule follow with PCP.   Follow up in 6 months

## 2023-05-05 ENCOUNTER — Encounter: Payer: Self-pay | Admitting: Family Medicine

## 2023-05-05 ENCOUNTER — Ambulatory Visit (INDEPENDENT_AMBULATORY_CARE_PROVIDER_SITE_OTHER): Payer: Managed Care, Other (non HMO) | Admitting: Family Medicine

## 2023-05-05 VITALS — BP 161/98 | HR 84 | Ht 66.0 in | Wt 398.8 lb

## 2023-05-05 DIAGNOSIS — G4733 Obstructive sleep apnea (adult) (pediatric): Secondary | ICD-10-CM

## 2023-07-31 ENCOUNTER — Other Ambulatory Visit: Payer: Managed Care, Other (non HMO)

## 2023-07-31 NOTE — Progress Notes (Signed)
07/31/2023 Name: Ann Woods MRN: [id number] DOB: 30-Dec-1975  Chief Complaint  Patient presents with   Hypertension    Ann Woods is a 47 y.o. year old female who presented for a telephone visit.   They were referred to the pharmacist by their PCP for assistance in managing hypertension.    Subjective:  Care Team: Primary Care Provider: Tollie Eth, NP  Medication Access/Adherence  Current Pharmacy:  [facility] [phone] - [place], [place] - [address] AT [facility] [address] [place] [address] Phone: [phone] Fax: [phone]  [facility] [phone] [address] - [place], [place] - [address] [place] [address] Phone: [phone] Fax: [phone]   Patient reports affordability concerns with their medications: No  Patient reports access/transportation concerns to their pharmacy: No  Patient reports adherence concerns with their medications:  No     Hypertension:  Current medications: Amlodipine-Benazepril 5-10mg  Medications previously tried: HCTZ  Patient has a validated, automated, upper arm home BP cuff Current blood pressure readings readings: 138/79  Patient denies hypotensive s/sx including dizziness, lightheadedness.  Patient denies hypertensive symptoms including headache, chest pain, shortness of breath  Mindful of sodium intake  Current physical activity: Water Aquatherapy for PT   Objective:  Lab Results  Component Value Date   HGBA1C 5.4 03/04/2021    Lab Results  Component Value Date   CREATININE 0.85 09/09/2022   BUN 9 09/09/2022   NA 133 (L) 09/09/2022   K 4.0 09/09/2022   CL 99 09/09/2022   CO2 27 09/09/2022    Lab Results  Component Value Date   CHOL 157 03/04/2021   HDL 40 03/04/2021   LDLCALC 102 (H) 03/04/2021   TRIG 80 03/04/2021   CHOLHDL 3.3 03/02/2018    Medications Reviewed Today     Reviewed by Sherrill Raring, RPH (Pharmacist) on 07/31/23 at 1301  Med List Status: <None>   Medication Order Taking? Sig Documenting Provider Last Dose Status Informant  acetaminophen (TYLENOL) 500 MG tablet [id number] Yes Take 1,000 mg by mouth every 6 (six) hours as needed. [provider] Taking Active            Med Note Katrinka Blazing, Fredderick Severance Sep 11, 2022  2:52 PM) Last dose 10:00  albuterol (VENTOLIN HFA) 108 (90 Base) MCG/ACT inhaler [id number] Yes Inhale 2 puffs into the lungs every 6 (six) hours as needed for wheezing or shortness of breath. Avanell Shackleton, NP-C Taking Active            Med Note Sharman Crate Sep 11, 2022  2:49 PM) prn  amLODipine-benazepril (LOTREL) 5-10 MG capsule [id number] Yes Take 1 capsule by mouth daily. Whitmire, Dawn, FNP Taking Active   azelastine (ASTELIN) 0.1 % nasal spray [id number]  Use 1 spray in each nostril twice daily Marcelyn Bruins, MD  Active            Med Note Katrinka Blazing, Fredderick Severance Sep 11, 2022  2:50 PM) As needed  benzonatate (TESSALON PERLES) 100 MG capsule [id number]  Take 1 capsule (100 mg total) by mouth 3 (three) times daily as needed for cough. Levin Erp, MD  Active            Med Note Katrinka Blazing, Fredderick Severance Sep 11, 2022  2:50 PM) Last dose 10:00am  dextromethorphan 15 MG/5ML syrup  [id number]  Take 10 mLs by mouth 4 (four) times daily as needed for cough. [provider]  Active            Med Note Lynelle Doctor, EVE   Thu Sep 11, 2022  3:23 PM) Taking tussin DM (guaifenesin AND dextromethorphan)  fluticasone (FLONASE) 50 MCG/ACT nasal spray [id number]  Place 2 sprays into both nostrils daily. Rushie Chestnut, PA-C  Active            Med Note Katrinka Blazing, Fredderick Severance Sep 11, 2022  2:51 PM) prn  ketoconazole (NIZORAL) 2 % cream [id number]  Apply 1 Application topically as needed. [provider]  Active   ondansetron (ZOFRAN) 8 MG tablet [id number]  Take 1 tablet (8 mg total) by mouth every 8 (eight) hours  as needed for nausea or vomiting. Levin Erp, MD  Active            Med Note Katrinka Blazing, Fredderick Severance Sep 11, 2022  2:51 PM) Last dose at 10:00am  triamcinolone cream (KENALOG) 0.1 % [id number]  Apply 1 Application topically as needed. [provider]  Active   [name] (CYANOCOBALAMIN) 500 MCG tablet [id number] Yes Take 500 mcg by mouth daily. [provider] Taking Active            Med Note Quintella Reichert, Candie Chroman   Fri Feb 08, 2021  9:21 AM) As needed per pt  zinc gluconate 50 MG tablet [id number] Yes Take 50 mg by mouth daily. [provider] Taking Active            Med Note Quintella Reichert, Candie Chroman   Fri Feb 08, 2021  9:22 AM) As needed per pt              Assessment/Plan:   Hypertension: - Currently controlled - Reviewed long term cardiovascular and renal outcomes of uncontrolled blood pressure - Reviewed appropriate blood pressure monitoring technique and reviewed goal blood pressure. Recommended to check home blood pressure and heart rate once weekly - Recommend to continue medication therapy     Follow Up Plan: Schedule follow up with PCP  Sherrill Raring, PharmD Clinical Pharmacist [phone]

## 2023-09-10 ENCOUNTER — Telehealth: Payer: Managed Care, Other (non HMO) | Admitting: Nurse Practitioner

## 2023-09-10 ENCOUNTER — Other Ambulatory Visit (INDEPENDENT_AMBULATORY_CARE_PROVIDER_SITE_OTHER): Payer: Managed Care, Other (non HMO)

## 2023-09-10 DIAGNOSIS — I10 Essential (primary) hypertension: Secondary | ICD-10-CM | POA: Diagnosis not present

## 2023-09-10 DIAGNOSIS — R6883 Chills (without fever): Secondary | ICD-10-CM

## 2023-09-10 DIAGNOSIS — J069 Acute upper respiratory infection, unspecified: Secondary | ICD-10-CM | POA: Diagnosis not present

## 2023-09-10 DIAGNOSIS — R059 Cough, unspecified: Secondary | ICD-10-CM | POA: Diagnosis not present

## 2023-09-10 DIAGNOSIS — R52 Pain, unspecified: Secondary | ICD-10-CM

## 2023-09-10 LAB — POCT RESPIRATORY SYNCYTIAL VIRUS: RSV Rapid Ag: NEGATIVE

## 2023-09-10 LAB — POC COVID19 BINAXNOW: SARS Coronavirus 2 Ag: POSITIVE — AB

## 2023-09-10 LAB — POCT INFLUENZA A/B
Influenza A, POC: NEGATIVE
Influenza B, POC: NEGATIVE

## 2023-09-10 MED ORDER — PREDNISONE 10 MG PO TABS: 10.0000 mg | ORAL_TABLET | Freq: Every day | ORAL | 0 refills | Status: DC

## 2023-09-10 MED ORDER — HYDROCODONE BIT-HOMATROP MBR 5-1.5 MG/5ML PO SOLN: 5.0000 mL | Freq: Three times a day (TID) | ORAL | 0 refills | Status: AC | PRN

## 2023-09-10 NOTE — Progress Notes (Signed)
Virtual Visit Encounter mychart visit.   I connected with  Mercy Moore on 09/16/23 at  9:30 AM EST by secure video and audio telemedicine application. I verified that I am speaking with the correct person using two identifiers.   I introduced myself as a Publishing rights manager with the practice. The limitations of evaluation and management by telemedicine discussed with the patient and the availability of in person appointments. The patient expressed verbal understanding and consent to proceed.  Participating parties in this visit include: Myself and patient  The patient is: Patient Location: Home I am: Provider Location: Office/Clinic Subjective:    CC and HPI: Ann Woods is a 48 y.o. year old female presenting for new evaluation and treatment of chills.  Samyrah tells me has been sick with chills, fever, congestion, body aches, cough, wheezing, shortness of breath with exertion and at rest, and diarrhea. She does not think that she has had a fever. This all started on Monday.   She missed work Tuesday and Wednesday. She does not return until next Tuesday. 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  She has tested for COVID and that is negative.   Past medical history, Surgical history, Family history not pertinant except as noted below, Social history, Allergies, and medications have been entered into the medical record, reviewed, and corrections made.   Review of Systems:  All review of systems negative except what is listed in the HPI  Objective:    Alert and oriented x 4 Audible congestion and cough Speaking in clear sentences with no shortness of breath. No distress.  Impression and Recommendations:    Problem List Items Addressed This Visit     Essential hypertension   Blood pressure was elevated at 144/100 but has since normalized to 129/79. No immediate  intervention required as current reading is within normal range. - Monitor blood pressure at home      Upper respiratory tract infection - Primary   Symptoms include body aches, chills, diarrhea, stomach pain, cough, wheezing, and shortness of breath. Differential diagnosis includes influenza, RSV, and other viral infections. COVID-19 ruled out by self-testing. Symptoms started on Monday. Wheezing may be related to underlying asthma or allergies. Discussed supportive care versus antiviral treatment for influenza. Patient prefers testing for influenza and RSV. Informed consent provided for low-dose prednisone and cough syrup with hydrocodone. Prednisone (5-10 mg for 5 days) expected to reduce airway inflammation; may cause side effects such as malaise. Cough syrup may cause drowsiness but will aid in congestion and cough relief. - Test for influenza and RSV - Prescribe low-dose prednisone 10 mg for 5 days - Prescribe cough syrup with hydrocodone for symptomatic relief - Advise rest, hydration, and alternating acetaminophen and ibuprofen for body aches and potential fever - Instruct to go to the emergency room if shortness of breath worsens - Advise to contact the office if symptoms improve and then worsen to rule out pneumonia      Relevant Medications   predniSONE (DELTASONE) 10 MG tablet   HYDROcodone bit-homatropine (HYCODAN) 5-1.5 MG/5ML syrup    orders and follow up as documented in EMR I discussed the assessment and treatment plan with the patient. The patient was provided an opportunity to ask questions and all were answered. The patient agreed with the plan and demonstrated an understanding of the instructions.  The patient was advised to call back or seek an in-person evaluation if the symptoms worsen or if the condition fails to improve as anticipated.  Follow-Up: prn  I provided 25 minutes of non-face-to-face interaction with this non face-to-face encounter including intake,  same-day documentation, and chart review.   Tollie Eth, NP , DNP, AGNP-c [facility]

## 2023-09-16 ENCOUNTER — Encounter: Payer: Self-pay | Admitting: Nurse Practitioner

## 2023-09-16 DIAGNOSIS — J069 Acute upper respiratory infection, unspecified: Secondary | ICD-10-CM | POA: Insufficient documentation

## 2023-09-16 HISTORY — DX: Acute upper respiratory infection, unspecified: J06.9

## 2023-09-16 NOTE — Assessment & Plan Note (Signed)
Symptoms include body aches, chills, diarrhea, stomach pain, cough, wheezing, and shortness of breath. Differential diagnosis includes influenza, RSV, and other viral infections. COVID-19 ruled out by self-testing. Symptoms started on Monday. Wheezing may be related to underlying asthma or allergies. Discussed supportive care versus antiviral treatment for influenza. Patient prefers testing for influenza and RSV. Informed consent provided for low-dose prednisone and cough syrup with hydrocodone. Prednisone (5-10 mg for 5 days) expected to reduce airway inflammation; may cause side effects such as malaise. Cough syrup may cause drowsiness but will aid in congestion and cough relief. - Test for influenza and RSV - Prescribe low-dose prednisone 10 mg for 5 days - Prescribe cough syrup with hydrocodone for symptomatic relief - Advise rest, hydration, and alternating acetaminophen and ibuprofen for body aches and potential fever - Instruct to go to the emergency room if shortness of breath worsens - Advise to contact the office if symptoms improve and then worsen to rule out pneumonia

## 2023-09-16 NOTE — Assessment & Plan Note (Signed)
Blood pressure was elevated at 144/100 but has since normalized to 129/79. No immediate intervention required as current reading is within normal range. - Monitor blood pressure at home

## 2023-10-28 NOTE — Patient Instructions (Signed)

## 2023-10-28 NOTE — Progress Notes (Signed)
 PATIENT: Ann Woods DOB: 1975/10/19  REASON FOR VISIT: follow up HISTORY FROM: patient  Chief Complaint  Patient presents with   Follow-up    Pt in 1 Pt here for cpap f/u Pt states falls alseep before putting on mask for cpap. Pt has questions about rehab      HISTORY OF PRESENT ILLNESS:  11/02/23 ALL:  Ann Woods returns for follow up for OSA on CPAP. She was last seen 04/2023 and continued to have difficulty meeting compliance. We sent her for mask refitting and encouraged daily use. Since, she reports no significant changes. She denies concerns with mask. Leak improved with new mask. She usually gets in bed and forgets to start therapy. She falls asleep and does not wake to start therapy. She does report start pressure seems to be stronger than normal. Ramp at 4cmH20.     05/05/2023 ALL:  Ann Woods returns for follow up for OSA on CPAP. She was last seen 05/2021 and having difficulty meeting compliance due to falling asleep prior to starting therapy. She did note benefit of using therapy and was advised to follow up in 4-5 months. Since, she reports continued concerns of low compliance. Mostly due to not starting therapy when she gets in bed. She does also note an air leak that is bothersome. She is using FFM. She denies concerns with machine and has needed supplies. She reports BP was 138/88 this morning before coming to her appt. She is taking HTN agent daily as prescribed. She is due to follow up with PCP.     05/27/21 ALL (Mychart):  Ann Woods returns for follow up for OSA on CPAP. She has been struggling to meet compliance goals of at least 70%. Last visit 10/2020, she was doing fairly well and compliance was better. Since, she reports that compliance has fallen off a bit. She denies concerns with machine or supplies. She reports that she falls asleep before starting therapy. She does feel better when using CPAP consistently. She reports headaches are significantly better.      11/19/2020 ALL: She returns for CPAP follow up. She is doing well. She has improved compliance and feels she is sleeping better. She has noted air leaking around the top of her full face mask but continues to adjust at home. Leak has been acceptable. She denies concerns today.     05/24/2020 ALL:  Ann Woods ARRAMBIDE is a 48 y.o. female here today for follow up for OSA on CPAP. She is doing better with CPAP therapy. She continues to have difficulty remembering to start therapy before going to sleep. She often drifts off to sleep while looking at her phone. She does recognize benefit of CPAP therapy. She has changed to a full face mask and feels it is a better fit for her.   Compliance report dated 04/23/2020 through 05/22/2020 reveals that she used CPAP 20 the past 30 days for compliance of 67%.  She used CPAP greater than 4 hours 16 of the past 30 days for compliance of 53%  Average usage on days used was 4 hours and 49 minutes.  Residual AHI was 2.0 on 7 to 14 cm of water and an EPR of 3.  There was a leak noted in the 95th percentile of 23 L/min.  HISTORY: (copied from Dr Teofilo Pod note on 03/06/2020)  Ann Woods is a 48 year old right-handed woman with an underlying medical history of prediabetes, hypertension, hyperlipidemia, hepatitis B, anemia, seasonal allergies, lower extremity swelling and morbid obesity  with a BMI of over 60, who Presents for follow-up consultation of his severe obstructive sleep apnea after interim home sleep testing and starting AutoPap therapy. The patient is unaccompanied today. I first met her on 11/10/2019 at the request of her primary care, at which time she reported snoring and excessive daytime somnolence. She was advised to proceed with sleep study testing. She had a home sleep test on 12/12/2019 which indicated severe obstructive sleep apnea with an AHI of 86.6/h, O2 nadir of 58%. She was advised to start AutoPap therapy.     Today, 02/20/2020: I reviewed her  AutoPap compliance data from 01/17/2020 through 02/15/2020, which is a total of 30 days, during which time she used her machine only 16 days with percent use days greater than 4 hours at 13% only, indicating low compliance with an average usage of 3 hours and 10 minutes, residual AHI 2/h, 95th percentile pressure of 13 cm, range of 7 to 14 cm with EPR of 3, leak on the higher side with a 95th percentile at 17.9 L/min. Her set up date was 01/03/20. She reports that she is still adjusting to treatment.  Her biggest challenge is to remember to put the machine on.  She has woken up and had pulled the mask off inadvertently as well.  She has noticed already some improvements including less daytime somnolence, less nocturia and less headaches when she uses the machine and in the past few days she has used it more consistently.  She tracks her usage through the app as well.  She is motivated to continue.  She is working on weight loss and is followed by the healthy weight and wellness clinic through [facility].   The patient's allergies, current medications, family history, past medical history, past social history, past surgical history and problem list were reviewed and updated as appropriate.    Previously:    11/10/19: (She) reports snoring and excessive daytime somnolence.  I reviewed the office note from 10/31/2019.  Her Epworth sleepiness score is 15/24, fatigue severity score is 41/63. She has had occasional AM headaches. She has nocturia about 1/night. Bedtime is around 10, but she has difficulty falling asleep, may be awake till 1 AM. Rise time is 5:45 AM. She lives with her BF and 2 kids, ages 64 and 34. They have 1 dog in the the home. TV in the bedroom tends to stay on at night. She is a NS and does not drink alcohol, no daily caffeine.  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   REVIEW OF SYSTEMS: Out of a complete 14 system review of symptoms, the patient complains only of the following  symptoms, fatigue and all other reviewed systems are negative.  ESS: 16/24, previously 19 FSS: 29  ALLERGIES: No Known Allergies  HOME MEDICATIONS: Outpatient Medications Prior to Visit  Medication Sig Dispense Refill   acetaminophen (TYLENOL) 500 MG tablet Take 1,000 mg by mouth every 6 (six) hours as needed.     albuterol (VENTOLIN HFA) 108 (90 Base) MCG/ACT inhaler Inhale 2 puffs into the lungs every 6 (six) hours as needed for wheezing or shortness of breath. 18 g 0   amLODipine-benazepril (LOTREL) 5-10 MG capsule Take 1 capsule by mouth daily. 30 capsule 0   azelastine (ASTELIN) 0.1 % nasal spray Use 1 spray in each nostril twice daily 30 mL 5   benzonatate (TESSALON PERLES) 100 MG capsule Take 1 capsule (100 mg total) by mouth 3 (three) times daily  as needed for cough. 30 capsule 0   dextromethorphan 15 MG/5ML syrup Take 10 mLs by mouth 4 (four) times daily as needed for cough.     fluticasone (FLONASE) 50 MCG/ACT nasal spray Place 2 sprays into both nostrils daily. 16 mL 0   HYDROcodone bit-homatropine (HYCODAN) 5-1.5 MG/5ML syrup Take 5 mLs by mouth every 8 (eight) hours as needed for cough. 120 mL 0   ketoconazole (NIZORAL) 2 % cream Apply 1 Application topically as needed.     ondansetron (ZOFRAN) 8 MG tablet Take 1 tablet (8 mg total) by mouth every 8 (eight) hours as needed for nausea or vomiting. 20 tablet 0   predniSONE (DELTASONE) 10 MG tablet Take 1 tablet (10 mg total) by mouth daily with breakfast. 5 tablet 0   triamcinolone cream (KENALOG) 0.1 % Apply 1 Application topically as needed.     [name] (CYANOCOBALAMIN) 500 MCG tablet Take 500 mcg by mouth daily.     zinc gluconate 50 MG tablet Take 50 mg by mouth daily.     No facility-administered medications prior to visit.    PAST MEDICAL HISTORY: Past Medical History:  Diagnosis Date   Allergies    reports allergies to dust, pollen, grass and trees   Anemia    Hepatitis B    High cholesterol    Hypertension     G2 - preeclampsia, CHTN   Infertility, female    Lactose intolerance    Obesity    Obesity    Prediabetes 04/01/2018   Seasonal allergies    Shortness of breath    Swelling of both lower extremities    Upper respiratory tract infection 09/16/2023   Urticaria     PAST SURGICAL HISTORY: Past Surgical History:  Procedure Laterality Date   CESAREAN SECTION     HIP SURGERY     TUBAL LIGATION Bilateral 05/05/2014   Procedure: POST PARTUM TUBAL LIGATION;  Surgeon: Reva Bores, MD;  Location: [facility];  Service: Gynecology;  Laterality: Bilateral;    FAMILY HISTORY: Family History  Problem Relation Age of Onset   Heart disease Mother    Thyroid disease Mother    High blood pressure Mother    Obesity Mother    COPD Father    Allergic rhinitis Neg Hx    Angioedema Neg Hx    Asthma Neg Hx    Eczema Neg Hx    Immunodeficiency Neg Hx    Urticaria Neg Hx    Sleep apnea Neg Hx     SOCIAL HISTORY: Social History   Socioeconomic History   Marital status: Significant Other    Spouse name: Not on file   Number of children: Not on file   Years of education: Not on file   Highest education level: Not on file  Occupational History   Occupation: Psychologist, counselling  Tobacco Use   Smoking status: Former    Current packs/day: 0.00    Types: Cigarettes    Start date: 02/23/1993    Quit date: 08/25/1993    Years since quitting: 30.2   Smokeless tobacco: Never   Tobacco comments:    one pack of cigarettes weekly- 05/27/18  Vaping Use   Vaping status: Never Used  Substance and Sexual Activity   Alcohol use: Not Currently   Drug use: No   Sexual activity: Yes    Birth control/protection: None  Other Topics Concern   Not on file  Social History Narrative   Pt lives with family  Pt works    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  Generalized: Well developed, in no acute distress  Cardiology: normal rate and rhythm, no murmur noted Respiratory: clear to auscultation bilaterally  Neurological examination  Mentation: Alert oriented to time, place, history taking. Follows all commands speech and language fluent Cranial nerve II-XII: Pupils were equal round reactive to light. Extraocular movements were full, visual field were full  Motor: The motor testing reveals 5 over 5 strength of all 4 extremities. Good symmetric motor tone is noted throughout.  Gait and station: Gait is normal.   DIAGNOSTIC DATA (LABS, IMAGING, TESTING) - I reviewed patient records, labs, notes, testing and imaging myself where available.      No data to display           Lab Results  Component Value Date   WBC 3.6 (L) 09/09/2022   HGB 11.9 (L) 09/09/2022   HCT 37.6 09/09/2022   MCV 85.5 09/09/2022   PLT 186 09/09/2022      Component Value Date/Time   NA 133 (L) 09/09/2022 0042   NA 143 03/04/2021 0908   K 4.0 09/09/2022 0042   CL 99 09/09/2022 0042   CO2 27 09/09/2022 0042   GLUCOSE 94 09/09/2022 0042   BUN 9 09/09/2022 0042   BUN 4 (L) 03/04/2021 0908   CREATININE 0.85 09/09/2022 0042   CREATININE 0.76 03/11/2021 0938   CALCIUM 8.6 (L) 09/09/2022 0042   PROT 6.8 03/26/2022 0943   PROT 7.2 03/04/2021 0908   ALBUMIN 4.0 03/04/2021 0908   AST 18 03/26/2022 0943   ALT 13 03/26/2022 0943   ALT 15 06/17/2018 0939   ALKPHOS 98 03/04/2021 0908   BILITOT 0.7 03/26/2022 0943   BILITOT 0.8 03/04/2021 0908   GFRNONAA >60 09/09/2022 0042   GFRNONAA 109 03/12/2020 0924   GFRAA 122 08/06/2020 0950   GFRAA 126 03/12/2020 0924   Lab Results  Component Value Date   CHOL 157 03/04/2021    HDL 40 03/04/2021   LDLCALC 102 (H) 03/04/2021   TRIG 80 03/04/2021   CHOLHDL 3.3 03/02/2018   Lab Results  Component Value Date   HGBA1C 5.4 03/04/2021   Lab Results  Component Value Date   VITAMINB12 240 03/04/2021   Lab Results  Component Value Date   TSH 1.520 06/23/2019     ASSESSMENT AND PLAN 48 y.o. year old female  has a past medical history of Allergies, Anemia, Hepatitis B, High cholesterol, Hypertension, Infertility, female, Lactose intolerance, Obesity, Obesity, Prediabetes (04/01/2018), Seasonal allergies, Shortness of breath, Swelling of both lower extremities, Upper respiratory tract infection (09/16/2023), and Urticaria. here with     ICD-10-CM   1. OSA on CPAP  G47.33 For home use only DME continuous positive airway pressure (CPAP)      Okla continues to work on increasing CPAP compliance. Compliance now 33% daily and 19% 4 hour usage. She does note benefit with using CPAP therapy at home. She was encouraged to use CPAP nightly and  for greater than 4 hours each night. No significant leak noted on report. She will call me with any continued concerns. I have advised she speak with DME if she has any concerns of machine malfunctioning. Healthy lifestyle habits encouraged.  She will follow-up with me in 1 year, sooner if needed.  She verbalizes understanding and agreement with this plan.   Orders Placed This Encounter  Procedures   For home use only DME continuous positive airway pressure (CPAP)    Heated Humidity with all supplies as needed    Length of Need:   Lifetime    Patient has OSA or probable OSA:   Yes    Is the patient currently using CPAP in the home:   Yes    Settings:   Other see comments    CPAP supplies needed:   Mask, headgear, cushions, filters, heated tubing and water chamber     No orders of the defined types were placed in this encounter.      Shawnie Dapper, FNP-C 11/02/2023, 9:28 AM [facility] [address] [place], [place] [address] [phone]

## 2023-11-02 ENCOUNTER — Encounter: Payer: Self-pay | Admitting: Family Medicine

## 2023-11-02 ENCOUNTER — Ambulatory Visit (INDEPENDENT_AMBULATORY_CARE_PROVIDER_SITE_OTHER): Payer: Managed Care, Other (non HMO) | Admitting: Family Medicine

## 2023-11-02 VITALS — BP 134/84 | HR 69 | Ht 66.0 in | Wt 399.0 lb

## 2023-11-02 DIAGNOSIS — G4733 Obstructive sleep apnea (adult) (pediatric): Secondary | ICD-10-CM

## 2024-05-15 ENCOUNTER — Encounter (HOSPITAL_COMMUNITY): Payer: Self-pay

## 2024-05-15 ENCOUNTER — Encounter (HOSPITAL_COMMUNITY): Payer: Self-pay | Admitting: *Deleted

## 2024-05-15 ENCOUNTER — Other Ambulatory Visit: Payer: Self-pay

## 2024-05-15 ENCOUNTER — Ambulatory Visit (HOSPITAL_COMMUNITY): Admission: EM | Admit: 2024-05-15 | Discharge: 2024-05-15 | Disposition: A | Discharge status: Home / Self Care

## 2024-05-15 ENCOUNTER — Emergency Department (HOSPITAL_COMMUNITY)
Admission: EM | Admit: 2024-05-15 | Discharge: 2024-05-15 | Disposition: A | Discharge status: Home / Self Care | Attending: Emergency Medicine | Admitting: Emergency Medicine

## 2024-05-15 ENCOUNTER — Emergency Department (HOSPITAL_COMMUNITY)

## 2024-05-15 DIAGNOSIS — G4489 Other headache syndrome: Secondary | ICD-10-CM

## 2024-05-15 DIAGNOSIS — R42 Dizziness and giddiness: Secondary | ICD-10-CM | POA: Diagnosis not present

## 2024-05-15 DIAGNOSIS — J208 Acute bronchitis due to other specified organisms: Secondary | ICD-10-CM | POA: Insufficient documentation

## 2024-05-15 DIAGNOSIS — I160 Hypertensive urgency: Secondary | ICD-10-CM | POA: Diagnosis not present

## 2024-05-15 DIAGNOSIS — B9789 Other viral agents as the cause of diseases classified elsewhere: Secondary | ICD-10-CM | POA: Diagnosis not present

## 2024-05-15 DIAGNOSIS — R0602 Shortness of breath: Secondary | ICD-10-CM

## 2024-05-15 DIAGNOSIS — R059 Cough, unspecified: Secondary | ICD-10-CM | POA: Diagnosis present

## 2024-05-15 LAB — CBC
HCT: 37.9 % (ref 36.0–46.0)
Hemoglobin: 11.5 g/dL — ABNORMAL LOW (ref 12.0–15.0)
MCH: 26.5 pg (ref 26.0–34.0)
MCHC: 30.3 g/dL (ref 30.0–36.0)
MCV: 87.3 fL (ref 80.0–100.0)
Platelets: 231 K/uL (ref 150–400)
RBC: 4.34 MIL/uL (ref 3.87–5.11)
RDW: 14.5 % (ref 11.5–15.5)
WBC: 3.3 K/uL — ABNORMAL LOW (ref 4.0–10.5)
nRBC: 0 % (ref 0.0–0.2)

## 2024-05-15 LAB — BASIC METABOLIC PANEL WITH GFR
Anion gap: 14 (ref 5–15)
BUN: 10 mg/dL (ref 6–20)
CO2: 24 mmol/L (ref 22–32)
Calcium: 9.1 mg/dL (ref 8.9–10.3)
Chloride: 101 mmol/L (ref 98–111)
Creatinine, Ser: 0.91 mg/dL (ref 0.44–1.00)
GFR, Estimated: >60 mL/min (ref >60)
Glucose, Bld: 94 mg/dL (ref 70–99)
Potassium: 3.5 mmol/L (ref 3.5–5.1)
Sodium: 139 mmol/L (ref 135–145)

## 2024-05-15 LAB — RESP PANEL BY RT-PCR (RSV, FLU A&B, COVID)  RVPGX2
Influenza A by PCR: NEGATIVE
Influenza B by PCR: NEGATIVE
Resp Syncytial Virus by PCR: NEGATIVE
SARS Coronavirus 2 by RT PCR: NEGATIVE

## 2024-05-15 LAB — TROPONIN I (HIGH SENSITIVITY)
Troponin I (High Sensitivity): 8 ng/L (ref <18)
Troponin I (High Sensitivity): 10 ng/L (ref <18)

## 2024-05-15 MED ORDER — PREDNISONE 50 MG PO TABS: 50.0000 mg | ORAL_TABLET | Freq: Every day | ORAL | 0 refills | Status: AC

## 2024-05-15 MED ORDER — IPRATROPIUM-ALBUTEROL 0.5-2.5 (3) MG/3ML IN SOLN
3.0000 mL | Freq: Once | RESPIRATORY_TRACT | Status: AC
  Administered 2024-05-15: 3 mL via RESPIRATORY_TRACT
  Filled 2024-05-15: qty 3

## 2024-05-15 MED ORDER — PREDNISONE 20 MG PO TABS
60.0000 mg | ORAL_TABLET | Freq: Once | ORAL | Status: AC
  Administered 2024-05-15: 60 mg via ORAL
  Filled 2024-05-15: qty 3

## 2024-05-15 MED ORDER — ALBUTEROL SULFATE HFA 108 (90 BASE) MCG/ACT IN AERS
2.0000 | INHALATION_SPRAY | Freq: Once | RESPIRATORY_TRACT | Status: AC
  Administered 2024-05-15: 2 via RESPIRATORY_TRACT
  Filled 2024-05-15: qty 6.7

## 2024-05-15 MED ORDER — ACETAMINOPHEN 325 MG PO TABS
650.0000 mg | ORAL_TABLET | Freq: Once | ORAL | Status: AC
  Administered 2024-05-15: 650 mg via ORAL

## 2024-05-15 MED ORDER — ACETAMINOPHEN 325 MG PO TABS
ORAL_TABLET | ORAL | Status: AC
  Filled 2024-05-15: qty 2

## 2024-05-15 NOTE — ED Notes (Signed)
 Patient is being discharged from the Urgent Care and sent to the Emergency Department via POV . Per E. White, PA, patient is in need of higher level of care due to hypertensive crisis. Patient is aware and verbalizes understanding of plan of care.  Vitals:   05/15/24 1557  BP: (!) 227/143  Pulse: 82  Resp: (!) 22  Temp: 98.8 F (37.1 C)  SpO2: 95%

## 2024-05-15 NOTE — Discharge Instructions (Signed)
 You were seen in the Emergency Department for wheezing and trouble breathing Your blood pressure was very high as well but came down while you are resting here Your breathing improved after 2 breathing treatments and some steroids Your chest x-ray looked okay your COVID flu RSV were all negative The most likely cause of your symptoms would be a viral respiratory illness with wheezing We gave you an albuterol  inhaler to go home with and called in a couple more days of prednisone  for you to pick up from your pharmacy and begin taking as directed once a day to help with your breathing Need to follow-up with your primary doctor within 1 week for reevaluation Return to the emergency room for chest pain trouble breathing or any other concerns Continue taking all previous prescribed occasions

## 2024-05-15 NOTE — ED Provider Notes (Signed)
 [facility]    CSN: [id number] Arrival date & time: 05/15/24  1550      History   Chief Complaint Chief Complaint  Patient presents with   Shortness of Breath   Hypertension   Wheezing    HPI Ann Woods is a 48 y.o. female.   48 year old female presents urgent care with complaints of shortness of breath, wheezing, headache, dizziness and high blood pressure.  She reports that symptoms started out 4 days ago but have not gotten any better.  She denies any chest pain, visual changes, slurred speech, fevers.  She reports that she does take amlodipine  for her blood pressure and has been compliant with the medication taking it as directed.  She reports that the headache and dizziness just started in the last day.  She has not had any sick contacts that she knows of.  She does not have asthma or COPD.   Shortness of Breath Associated symptoms: headaches and wheezing   Associated symptoms: no abdominal pain, no chest pain, no cough, no ear pain, no fever, no rash, no sore throat and no vomiting   Hypertension Associated symptoms include headaches and shortness of breath. Pertinent negatives include no chest pain and no abdominal pain.  Wheezing Associated symptoms: headaches and shortness of breath   Associated symptoms: no chest pain, no chest tightness, no cough, no ear pain, no fever, no rash and no sore throat     Past Medical History:  Diagnosis Date   Allergies    reports allergies to dust, pollen, grass and trees   Anemia    Hepatitis B    High cholesterol    Hypertension    G2 - preeclampsia, CHTN   Infertility, female    Lactose intolerance    Obesity    Obesity    Prediabetes 04/01/2018   Seasonal allergies    Shortness of breath    Swelling of both lower extremities    Upper respiratory tract infection 09/16/2023   Urticaria     Patient Active Problem List   Diagnosis Date Noted   Upper respiratory tract infection 09/16/2023   Gallstones  03/26/2022   Trigger thumb, right thumb 02/10/2022   Trigger finger, right ring finger 02/10/2022   At risk for cancer 03/25/2021   Insulin  resistance 02/22/2020   OSA on CPAP 01/30/2020   Low HDL (under 40) 11/01/2019   Other hyperlipidemia 10/10/2019   B12 deficiency 10/10/2019   Hepatic steatosis 08/29/2019   Need for prophylactic vaccination and inoculation against influenza 08/29/2019   Vitamin D  deficiency 10/25/2018   Essential hypertension 10/13/2018   Depression 10/13/2018   History of iron deficiency anemia 08/31/2018   Class 3 severe obesity with serious comorbidity and body mass index (BMI) of 60.0 to 69.9 in adult 08/31/2018   Liver fibrosis 07/15/2018   Prediabetes 04/01/2018   Anemia 02/17/2018   Dyspnea on exertion 02/17/2018   Cardiomegaly 02/17/2018   Morbid obesity (HCC) 02/17/2018   Abnormal PFT 02/17/2018   Encounter for sterilization 05/05/2014   Benign essential hypertension, antepartum 12/01/2013   Chronic viral hepatitis B without delta-agent (HCC) 12/01/2013   Allergic rhinitis 05/31/2007   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 Authorizing Provider  acetaminophen  (TYLENOL ) 500 MG tablet Take 1,000 mg by mouth every 6 (six) hours as needed.    [provider]  albuterol  (VENTOLIN  HFA) 108 (90 Base) MCG/ACT inhaler Inhale 2 puffs into the lungs every 6  (six) hours as needed for wheezing or shortness of breath. 07/31/20   Henson, Vickie L, NP-C  amLODipine -benazepril  (LOTREL) 5-10 MG capsule Take 1 capsule by mouth daily. 09/01/19   Whitmire, Dawn, FNP  azelastine  (ASTELIN ) 0.1 % nasal spray Use 1 spray in each nostril twice daily Patient not taking: Reported on 05/15/2024 01/21/19   Jeneal Danita Macintosh, MD  benzonatate  (TESSALON  PERLES) 100 MG capsule Take 1 capsule (100 mg total) by mouth 3 (three) times daily as needed for cough. Patient not taking: Reported on 05/15/2024 09/09/22   Christia Budds, MD  dextromethorphan  15 MG/5ML syrup Take 10 mLs by mouth 4 (four) times daily as needed for cough. Patient not taking: Reported on 05/15/2024    [provider]  fluticasone  (FLONASE ) 50 MCG/ACT nasal spray Place 2 sprays into both nostrils daily. 02/08/21   Tonette Lauraine HERO, PA-C  HYDROcodone  bit-homatropine (HYCODAN) 5-1.5 MG/5ML syrup Take 5 mLs by mouth every 8 (eight) hours as needed for cough. Patient not taking: Reported on 05/15/2024 09/10/23   Early, Sara E, NP  ketoconazole (NIZORAL) 2 % cream Apply 1 Application topically as needed. Patient not taking: Reported on 05/15/2024 07/23/22   [provider]  ondansetron  (ZOFRAN ) 8 MG tablet Take 1 tablet (8 mg total) by mouth every 8 (eight) hours as needed for nausea or vomiting. Patient not taking: Reported on 05/15/2024 09/09/22   Christia Budds, MD  predniSONE  (DELTASONE ) 10 MG tablet Take 1 tablet (10 mg total) by mouth daily with breakfast. 09/10/23   Early, Sara E, NP  triamcinolone  cream (KENALOG ) 0.1 % Apply 1 Application topically as needed. Patient not taking: Reported on 05/15/2024 07/23/22   [provider]  [name] (CYANOCOBALAMIN) 500 MCG tablet Take 500 mcg by mouth daily.    [provider]  zinc gluconate 50 MG tablet Take 50 mg by mouth daily.    [provider]    Family History Family History  Problem Relation Age of Onset    Heart disease Mother    Thyroid  disease Mother    High blood pressure Mother    Obesity Mother    COPD Father    Allergic rhinitis Neg Hx    Angioedema Neg Hx    Asthma Neg Hx    Eczema Neg Hx    Immunodeficiency Neg Hx    Urticaria Neg Hx    Sleep apnea Neg Hx     Social History Social History   Tobacco Use   Smoking status: Former    Current packs/day: 0.00    Types: Cigarettes    Start date: 02/23/1993    Quit date: 08/25/1993  Years since quitting: 30.7   Smokeless tobacco: Never   Tobacco comments:    one pack of cigarettes weekly- 05/27/18  Vaping Use   Vaping status: Never Used  Substance Use Topics   Alcohol use: Not Currently   Drug use: No     Allergies   Patient has no known allergies.   Review of Systems Review of Systems  Constitutional:  Negative for chills and fever.  HENT:  Negative for ear pain and sore throat.   Eyes:  Negative for pain and visual disturbance.  Respiratory:  Positive for shortness of breath and wheezing. Negative for cough and chest tightness.   Cardiovascular:  Negative for chest pain and palpitations.  Gastrointestinal:  Negative for abdominal pain and vomiting.  Genitourinary:  Negative for dysuria and hematuria.  Musculoskeletal:  Negative for arthralgias and back pain.  Skin:  Negative for color change and rash.  Neurological:  Positive for dizziness and headaches. Negative for seizures and syncope.  All other systems reviewed and are negative.    Physical Exam Triage Vital Signs ED Triage Vitals  Encounter Vitals Group     BP 05/15/24 1557 (!) 227/143     Girls Systolic BP Percentile --      Girls Diastolic BP Percentile --      Boys Systolic BP Percentile --      Boys Diastolic BP Percentile --      Pulse Rate 05/15/24 1557 82     Resp 05/15/24 1557 (!) 22     Temp 05/15/24 1557 98.8 F (37.1 C)     Temp Source 05/15/24 1557 Oral     SpO2 05/15/24 1557 95 %     Weight --      Height --      Head  Circumference --      Peak Flow --      Pain Score 05/15/24 1600 5     Pain Loc --      Pain Education --      Exclude from Growth Chart --    No data found.  Updated Vital Signs BP (!) 227/143 (BP Location: Left Arm)   Pulse 82   Temp 98.8 F (37.1 C) (Oral)   Resp (!) 22   LMP 02/15/2021   SpO2 95%   Visual Acuity Right Eye Distance:   Left Eye Distance:   Bilateral Distance:    Right Eye Near:   Left Eye Near:    Bilateral Near:     Physical Exam Vitals and nursing note reviewed.  Constitutional:      General: She is not in acute distress.    Appearance: She is well-developed.  HENT:     Head: Normocephalic and atraumatic.     Mouth/Throat:     Mouth: Mucous membranes are moist.  Eyes:     Extraocular Movements: Extraocular movements intact.     Conjunctiva/sclera: Conjunctivae normal.     Pupils: Pupils are equal, round, and reactive to light.  Cardiovascular:     Rate and Rhythm: Normal rate and regular rhythm.     Heart sounds: No murmur heard. Pulmonary:     Effort: Pulmonary effort is normal. No tachypnea or respiratory distress.     Breath sounds: Examination of the right-upper field reveals wheezing and rhonchi. Examination of the left-upper field reveals wheezing and rhonchi. Examination of the right-middle field reveals wheezing and rhonchi. Examination of the left-middle field reveals wheezing and rhonchi. Examination of the right-lower field reveals wheezing.  Examination of the left-lower field reveals wheezing. Wheezing and rhonchi present. No decreased breath sounds.  Abdominal:     Palpations: Abdomen is soft.     Tenderness: There is no abdominal tenderness.  Musculoskeletal:        General: No swelling.     Cervical back: Neck supple.  Skin:    General: Skin is warm and dry.     Capillary Refill: Capillary refill takes less than 2 seconds.  Neurological:     General: No focal deficit present.     Mental Status: She is alert and oriented to  person, place, and time.  Psychiatric:        Mood and Affect: Mood normal.        Behavior: Behavior normal.      UC Treatments / Results  Labs (all labs ordered are listed, but only abnormal results are displayed) Labs Reviewed - No data to display  EKG   Radiology No results found.  Procedures Procedures (including critical care time)  Medications Ordered in UC Medications - No data to display  Initial Impression / Assessment and Plan / UC Course  I have reviewed the triage vital signs and the nursing notes.  Pertinent labs & imaging results that were available during my care of the patient were reviewed by me and considered in my medical decision making (see chart for details).     Hypertensive urgency  Shortness of breath  Dizziness  Other headache syndrome   Concerning presentation with the blood pressure at 227/143 with headache, dizziness and shortness of breath. The symptoms may be secondary to a viral infection but the blood pressure with the associated symptoms likely needs more advanced workup in the emergency room and further management.   Final Clinical Impressions(s) / UC Diagnoses   Final diagnoses:  Hypertensive urgency  Shortness of breath  Dizziness  Other headache syndrome     Discharge Instructions      Due to the symptoms of dizziness, headache, shortness of breath as well as the extremely high blood pressure, we recommend further evaluation in the emergency room or more advanced imaging and testing can be performed.    ED Prescriptions   None    PDMP not reviewed this encounter.   Teresa Almarie LABOR, PA-C 05/15/24 1621

## 2024-05-15 NOTE — Discharge Instructions (Signed)
 Due to the symptoms of dizziness, headache, shortness of breath as well as the extremely high blood pressure, we recommend further evaluation in the emergency room or more advanced imaging and testing can be performed.

## 2024-05-15 NOTE — ED Provider Notes (Signed)
 [place] EMERGENCY DEPARTMENT AT [facility] Provider Note   CSN: [id number] Arrival date & time: 05/15/24  8286     Patient presents with: No chief complaint on file.   Ann Woods is a 48 y.o. female.  With a history of of obesity, obstructive sleep apnea and hypertension who presents to the ED for shortness of breath.  Patient has experienced symptoms including nasal congestion dry coughing shortness of breath and wheezing over the last 4 days.  She was seen at urgent care earlier today and directed here for further evaluation given concern for wheezing and   HPI     Prior to Admission medications   Medication Sig Start Date End Date Taking? Authorizing Provider  predniSONE  (DELTASONE ) 50 MG tablet Take 1 tablet (50 mg total) by mouth daily with breakfast for 3 days. 05/15/24 05/18/24 Yes Pamella Ozell LABOR, DO  acetaminophen  (TYLENOL ) 500 MG tablet Take 1,000 mg by mouth every 6 (six) hours as needed.    [provider]  albuterol  (VENTOLIN  HFA) 108 (90 Base) MCG/ACT inhaler Inhale 2 puffs into the lungs every 6 (six) hours as needed for wheezing or shortness of breath. 07/31/20   Henson, Vickie L, NP-C  amLODipine -benazepril  (LOTREL) 5-10 MG capsule Take 1 capsule by mouth daily. 09/01/19   Whitmire, Dawn, FNP  azelastine  (ASTELIN ) 0.1 % nasal spray Use 1 spray in each nostril twice daily Patient not taking: Reported on 05/15/2024 01/21/19   Jeneal Danita Macintosh, MD  benzonatate  (TESSALON  PERLES) 100 MG capsule Take 1 capsule (100 mg total) by mouth 3 (three) times daily as needed for cough. Patient not taking: Reported on 05/15/2024 09/09/22   Christia Budds, MD  dextromethorphan  15 MG/5ML syrup Take 10 mLs by mouth 4 (four) times daily as needed for cough. Patient not taking: Reported on 05/15/2024    [provider]  fluticasone  (FLONASE ) 50 MCG/ACT nasal spray Place 2 sprays into both nostrils daily. 02/08/21   Tonette Lauraine HERO, PA-C   HYDROcodone  bit-homatropine (HYCODAN) 5-1.5 MG/5ML syrup Take 5 mLs by mouth every 8 (eight) hours as needed for cough. Patient not taking: Reported on 05/15/2024 09/10/23   Early, Sara E, NP  ketoconazole (NIZORAL) 2 % cream Apply 1 Application topically as needed. Patient not taking: Reported on 05/15/2024 07/23/22   [provider]  ondansetron  (ZOFRAN ) 8 MG tablet Take 1 tablet (8 mg total) by mouth every 8 (eight) hours as needed for nausea or vomiting. Patient not taking: Reported on 05/15/2024 09/09/22   Christia Budds, MD  triamcinolone  cream (KENALOG ) 0.1 % Apply 1 Application topically as needed. Patient not taking: Reported on 05/15/2024 07/23/22   [provider]  [name] (CYANOCOBALAMIN) 500 MCG tablet Take 500 mcg by mouth daily.    [provider]  zinc gluconate 50 MG tablet Take 50 mg by mouth daily.    [provider]    Allergies: Patient has no known allergies.    Review of Systems  Updated Vital Signs BP (!) 170/105   Pulse 66   Temp 98.6 F (37 C)   Resp 20   Ht 5' 6 (1.676 m)   Wt (!) 181 kg   LMP 02/15/2021   SpO2 100%   BMI 64.41 kg/m   Physical Exam  (all labs ordered are listed, but only abnormal results are displayed) Labs Reviewed  CBC - Abnormal; Notable for the following components:      Result Value   WBC 3.3 (*)  Hemoglobin 11.5 (*)    All other components within normal limits  RESP PANEL BY RT-PCR (RSV, FLU A&B, COVID)  [id number]  BASIC METABOLIC PANEL WITH GFR  TROPONIN I (HIGH SENSITIVITY)  TROPONIN I (HIGH SENSITIVITY)    EKG: EKG Interpretation Date/Time:  "Sunday May 15 2024 17:25:36 EDT Ventricular Rate:  85 PR Interval:  160 QRS Duration:  78 QT Interval:  386 QTC Calculation: 459 R Axis:   83  Text Interpretation: Normal sinus rhythm Normal ECG When compared with ECG of 08-Sep-2022 17:24, PREVIOUS ECG IS PRESENT Confirmed by Felecia Stanfill ([id number]) on 05/15/2024 8:46:12  PM  Radiology: DG Chest 2 View Result Date: 05/15/2024 EXAM: 2 VIEW(S) XRAY OF THE CHEST 05/15/2024 07:11:00 PM COMPARISON: 09/08/2022 CLINICAL HISTORY: Headache for 2 days with an abnormal high bp. Non-productive cough with temp. Ordered for headache, sob and cough. Per triage notes: Patient reports that she has been having SOB and wheezing x 4 days. Patient states her BP today was 153/106. Patient states she takes Amlodipine and is compliant with taking. Patient added that she does have a slight headache and dizziness, but denies blurred vision. FINDINGS: LUNGS AND PLEURA: No focal pulmonary opacity. No pulmonary edema. No pleural effusion. No pneumothorax. HEART AND MEDIASTINUM: Cardiomegaly. BONES AND SOFT TISSUES: No acute osseous abnormality. IMPRESSION: 1. Cardiomegaly. 2. No acute cardiopulmonary abnormality. Electronically signed by: Sriyesh Krishnan MD 05/15/2024 07:19 PM EDT RP Workstation: [id number]     Procedures   Medications Ordered in the ED  albuterol (VENTOLIN HFA) 108 (90 Base) MCG/ACT inhaler 2 puff (has no administration in time range)  acetaminophen (TYLENOL) tablet 650 mg (650 mg Oral Given 05/15/24 1735)  ipratropium-albuterol (DUONEB) 0.5-2.5 (3) MG/3ML nebulizer solution 3 mL (3 mLs Nebulization Given 05/15/24 1800)  ipratropium-albuterol (DUONEB) 0.5-2.5 (3) MG/3ML nebulizer solution 3 mL (3 mLs Nebulization Given 05/15/24 1953)  predniSONE (DELTASONE) tablet 60 mg (60 mg Oral Given 05/15/24 1953)    Clinical Course as of 05/15/24 2049  Sun May 15, 2024  2046 Patient's wheezing and breathing sounds better after 2 DuoNeb treatments.  I gave her prednisone orally as well.  This may be viral bronchitis with wheezing.  Will discharge with albuterol MDI and a couple more days of prednisone to help with her symptoms.  Her blood pressure has improved after resting here with systolic blood pressure in the 170s down from 200s earlier.  She will continue taking previous prescribed  medications and follow-up with her PCP [MP]    Clinical Course User Index [MP] Mandela Bello A, DO                                 Medical Decision Making Risk Prescription drug management.        Final diagnoses:  Viral bronchitis    ED Discharge Orders          Ordered    predniSONE (DELTASONE) 50 MG tablet  Daily with breakfast        09" /21/25 2048               Pamella Ozell LABOR, DO 05/15/24 2050

## 2024-05-15 NOTE — ED Triage Notes (Addendum)
 Patient reports that she has been having SOB and wheezing x 4 days. Patient states her BP today was 153/106. Patient states she takes Amlodipine  and is compliant with taking .  Patient added that she does have a slight headache and dizziness, but denies blurred vision.

## 2024-05-29 ENCOUNTER — Emergency Department (HOSPITAL_COMMUNITY)

## 2024-05-29 ENCOUNTER — Other Ambulatory Visit: Payer: Self-pay

## 2024-05-29 ENCOUNTER — Emergency Department (HOSPITAL_COMMUNITY)
Admission: EM | Admit: 2024-05-29 | Discharge: 2024-05-29 | Disposition: A | Discharge status: Home / Self Care | Attending: Emergency Medicine | Admitting: Emergency Medicine

## 2024-05-29 ENCOUNTER — Ambulatory Visit (HOSPITAL_COMMUNITY)
Admission: EM | Admit: 2024-05-29 | Discharge: 2024-05-29 | Disposition: A | Discharge status: Home / Self Care | Attending: Nurse Practitioner | Admitting: Nurse Practitioner

## 2024-05-29 ENCOUNTER — Encounter (HOSPITAL_COMMUNITY): Payer: Self-pay | Admitting: *Deleted

## 2024-05-29 DIAGNOSIS — W19XXXA Unspecified fall, initial encounter: Secondary | ICD-10-CM | POA: Diagnosis not present

## 2024-05-29 DIAGNOSIS — S76011A Strain of muscle, fascia and tendon of right hip, initial encounter: Secondary | ICD-10-CM | POA: Insufficient documentation

## 2024-05-29 DIAGNOSIS — W1830XA Fall on same level, unspecified, initial encounter: Secondary | ICD-10-CM | POA: Insufficient documentation

## 2024-05-29 DIAGNOSIS — I10 Essential (primary) hypertension: Secondary | ICD-10-CM | POA: Diagnosis not present

## 2024-05-29 DIAGNOSIS — S93401A Sprain of unspecified ligament of right ankle, initial encounter: Secondary | ICD-10-CM | POA: Insufficient documentation

## 2024-05-29 DIAGNOSIS — Z79899 Other long term (current) drug therapy: Secondary | ICD-10-CM | POA: Diagnosis not present

## 2024-05-29 DIAGNOSIS — M25571 Pain in right ankle and joints of right foot: Secondary | ICD-10-CM | POA: Diagnosis not present

## 2024-05-29 DIAGNOSIS — M25551 Pain in right hip: Secondary | ICD-10-CM | POA: Diagnosis not present

## 2024-05-29 DIAGNOSIS — M79671 Pain in right foot: Secondary | ICD-10-CM

## 2024-05-29 DIAGNOSIS — S99911A Unspecified injury of right ankle, initial encounter: Secondary | ICD-10-CM | POA: Diagnosis present

## 2024-05-29 MED ORDER — IBUPROFEN 800 MG PO TABS: 800.0000 mg | ORAL_TABLET | Freq: Three times a day (TID) | ORAL | 0 refills | Status: AC

## 2024-05-29 MED ORDER — MORPHINE SULFATE (PF) 4 MG/ML IV SOLN
4.0000 mg | Freq: Once | INTRAVENOUS | Status: AC
  Administered 2024-05-29: 4 mg via INTRAMUSCULAR
  Filled 2024-05-29: qty 1

## 2024-05-29 MED ORDER — ACETAMINOPHEN 325 MG PO TABS
ORAL_TABLET | ORAL | Status: AC
  Filled 2024-05-29: qty 3

## 2024-05-29 MED ORDER — ACETAMINOPHEN 325 MG PO TABS
975.0000 mg | ORAL_TABLET | Freq: Once | ORAL | Status: AC
  Administered 2024-05-29: 975 mg via ORAL

## 2024-05-29 MED ORDER — ONDANSETRON 4 MG PO TBDP
4.0000 mg | ORAL_TABLET | Freq: Once | ORAL | Status: AC
  Administered 2024-05-29: 4 mg via ORAL
  Filled 2024-05-29: qty 1

## 2024-05-29 MED ORDER — OXYCODONE-ACETAMINOPHEN 5-325 MG PO TABS: 1.0000 | ORAL_TABLET | Freq: Four times a day (QID) | ORAL | 0 refills | Status: AC | PRN

## 2024-05-29 NOTE — ED Provider Notes (Signed)
 [facility]    CSN: [id number] Arrival date & time: 05/29/24  1504      History   Chief Complaint Chief Complaint  Patient presents with   Fall    HPI Ann Woods is a 48 y.o. female.   Patient presents today with right leg pain after fall last night.  Reports she is having pain in her hip, right lower extremity, ankle, and foot.  Feels that her right lower extremity is more swollen than normal.  No known bruising.  She is having pain with any movement.  Reports she hit her left side of her face against the wall and is having facial swelling.  Denies loss of consciousness.  Reports she took Tylenol  last night for the pain which helped minimally.  She fell last year and broke her left foot. Has been able to ambulate minimally which is a significant change from her baseline.  She is requesting xrays of her hip, shin, foot, and ankle today.     Past Medical History:  Diagnosis Date   Allergies    reports allergies to dust, pollen, grass and trees   Anemia    Hepatitis B    High cholesterol    Hypertension    G2 - preeclampsia, CHTN   Infertility, female    Lactose intolerance    Obesity    Obesity    Prediabetes 04/01/2018   Seasonal allergies    Shortness of breath    Swelling of both lower extremities    Upper respiratory tract infection 09/16/2023   Urticaria     Patient Active Problem List   Diagnosis Date Noted   Upper respiratory tract infection 09/16/2023   Gallstones 03/26/2022   Trigger thumb, right thumb 02/10/2022   Trigger finger, right ring finger 02/10/2022   At risk for cancer 03/25/2021   Insulin  resistance 02/22/2020   OSA on CPAP 01/30/2020   Low HDL (under 40) 11/01/2019   Other hyperlipidemia 10/10/2019   B12 deficiency 10/10/2019   Hepatic steatosis 08/29/2019   Need for prophylactic vaccination and inoculation against influenza 08/29/2019   Vitamin D  deficiency 10/25/2018   Essential hypertension 10/13/2018   Depression  10/13/2018   History of iron deficiency anemia 08/31/2018   Class 3 severe obesity with serious comorbidity and body mass index (BMI) of 60.0 to 69.9 in adult [facility]) 08/31/2018   Liver fibrosis 07/15/2018   Prediabetes 04/01/2018   Anemia 02/17/2018   Dyspnea on exertion 02/17/2018   Cardiomegaly 02/17/2018   Morbid obesity (HCC) 02/17/2018   Abnormal PFT 02/17/2018   Encounter for sterilization 05/05/2014   Benign essential hypertension, antepartum 12/01/2013   Chronic viral hepatitis B without delta-agent (HCC) 12/01/2013   Allergic rhinitis 05/31/2007   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 Authorizing Provider  amLODipine -benazepril  (LOTREL) 5-10 MG capsule Take 1 capsule by mouth daily. 09/01/19  Yes Whitmire, Dawn, FNP  azelastine  (ASTELIN ) 0.1 % nasal spray Use 1 spray in each nostril twice daily 01/21/19  Yes Padgett, Danita Macintosh, MD  fluticasone  (FLONASE ) 50 MCG/ACT nasal spray Place 2 sprays into both nostrils daily. 02/08/21  Yes Covington, Sarah M, PA-C  [name] (CYANOCOBALAMIN) 500 MCG tablet Take 500 mcg by mouth daily.   Yes [provider]  zinc gluconate 50 MG tablet Take 50 mg by mouth daily.   Yes [provider]  acetaminophen  (TYLENOL ) 500 MG tablet Take 1,000 mg by mouth every 6 (six) hours as needed.    [provider]  albuterol  (VENTOLIN   HFA) 108 (90 Base) MCG/ACT inhaler Inhale 2 puffs into the lungs every 6 (six) hours as needed for wheezing or shortness of breath. 07/31/20   Henson, Vickie L, NP-C  benzonatate  (TESSALON  PERLES) 100 MG capsule Take 1 capsule (100 mg total) by mouth 3 (three) times daily as needed for cough. Patient not taking: Reported on 05/15/2024 09/09/22   Christia Budds, MD  dextromethorphan  15 MG/5ML syrup Take 10 mLs by mouth 4 (four) times daily as needed for cough. Patient not taking: Reported on 05/15/2024    [provider]  HYDROcodone  bit-homatropine (HYCODAN) 5-1.5 MG/5ML syrup Take 5 mLs by mouth every 8 (eight) hours as needed for cough. Patient not taking: Reported on 05/15/2024 09/10/23   Early, Sara E, NP  ibuprofen  (ADVIL ) 800 MG tablet Take 1 tablet (800 mg total) by mouth 3 (three) times daily. 05/29/24   Haviland, Julie, MD  ketoconazole (NIZORAL) 2 % cream Apply 1 Application topically as needed. Patient not taking: Reported on 05/15/2024 07/23/22   [provider]  ondansetron  (ZOFRAN ) 8 MG tablet Take 1 tablet (8 mg total) by mouth every 8 (eight) hours as needed for nausea or vomiting. Patient not taking: Reported on 05/15/2024 09/09/22   Christia Budds, MD  oxyCODONE -acetaminophen  (PERCOCET/ROXICET) 5-325 MG tablet Take 1 tablet by mouth every 6 (six) hours as needed for severe pain (pain score 7-10). 05/29/24   Dean Clarity, MD  triamcinolone  cream (KENALOG ) 0.1 % Apply 1 Application topically as needed. Patient not taking: Reported on 05/15/2024 07/23/22   [provider]    Family History Family History  Problem Relation Age of Onset   Heart disease Mother    Thyroid  disease Mother    High blood pressure Mother    Obesity Mother    COPD Father    Allergic rhinitis Neg Hx    Angioedema Neg Hx    Asthma Neg Hx    Eczema Neg Hx    Immunodeficiency Neg Hx    Urticaria Neg Hx    Sleep apnea Neg Hx     Social History Social History   Tobacco Use    Smoking status: Former    Current packs/day: 0.00    Types: Cigarettes    Start date: 02/23/1993    Quit date: 08/25/1993    Years since quitting: 30.7   Smokeless tobacco: Never   Tobacco comments:    one pack of cigarettes weekly- 05/27/18  Vaping Use   Vaping status: Never Used  Substance Use Topics   Alcohol use: Not Currently   Drug use: No     Allergies   Patient has no known allergies.  Review of Systems Review of Systems Per HPI  Physical Exam Triage Vital Signs ED Triage Vitals  Encounter Vitals Group     BP 05/29/24 1656 (!) 173/113     Girls Systolic BP Percentile --      Girls Diastolic BP Percentile --      Boys Systolic BP Percentile --      Boys Diastolic BP Percentile --      Pulse Rate 05/29/24 1656 91     Resp 05/29/24 1656 20     Temp 05/29/24 1656 99.5 F (37.5 C)     Temp Source 05/29/24 1656 Oral     SpO2 05/29/24 1656 97 %     Weight --      Height --      Head Circumference --      Peak Flow --      Pain Score 05/29/24 1657 10     Pain Loc --      Pain Education --      Exclude from Growth Chart --    No data found.  Updated Vital Signs BP (!) 173/113 Comment: did not take HTN med today  Pulse 91   Temp 99.5 F (37.5 C) (Oral)   Resp 20   LMP 02/15/2021   SpO2 97%   Visual Acuity Right Eye Distance:   Left Eye Distance:   Bilateral Distance:    Right Eye Near:   Left Eye Near:    Bilateral Near:     Physical Exam Vitals and nursing note reviewed.  Constitutional:      General: She is not in acute distress.    Appearance: Normal appearance. She is not toxic-appearing.  HENT:     Mouth/Throat:     Mouth: Mucous membranes are moist.     Pharynx: Oropharynx is clear.  Pulmonary:     Effort: Pulmonary effort is normal. No respiratory distress.  Musculoskeletal:     Comments: Inspection: no swelling, bruising, obvious deformity or redness Palpation: tender to palpation right hip, lower leg, ankle, foot diffusely; no  obvious deformities palpated ROM: Difficult to assess secondary to pain Strength: difficult to assess secondary to pain Neurovascular: neurovascularly intact in distal right lower extremity  Examination of right lower extremity somewhat difficult due to pain and body habitus  Skin:    General: Skin is warm and dry.     Capillary Refill: Capillary refill takes less than 2 seconds.     Coloration: Skin is not jaundiced or pale.     Findings: No erythema.  Neurological:     Mental Status: She is alert and oriented to person, place, and time.     Gait: Gait abnormal (using cane and sitting in wheelchair; not baseline for patient).  Psychiatric:        Behavior: Behavior is cooperative.      UC Treatments / Results  Labs (all labs ordered are listed, but only abnormal results are displayed) Labs Reviewed - No data to display  EKG   Radiology DG Ankle Complete Right Result Date: 05/29/2024 EXAM: 3 or more VIEW(S) XRAY OF THE  ANKLE 05/29/2024 09:34:00 PM CLINICAL HISTORY: fall. fall COMPARISON: None available. FINDINGS: BONES AND JOINTS: Plantar calcaneal spur. SOFT TISSUES: Diffuse subcutaneous soft tissue edema. IMPRESSION: 1. No acute osseous abnormality. 2. Plantar calcaneal spur. 3. Diffuse subcutaneous soft tissue edema. Electronically signed by: Dorethia Molt MD 05/29/2024 09:54 PM EDT RP Workstation: [id number]   DG Knee Complete 4 Views Right Result Date: 05/29/2024  EXAM: 4 or more VIEW(S) XRAY OF THE  KNEE COMPARISON: None available. CLINICAL HISTORY: fall. fall FINDINGS: BONES AND JOINTS: Tricompartmental osteophyte formation with mild lateral compartment joint space narrowing consistent with degenerative changes. SOFT TISSUES: The soft tissues are unremarkable. IMPRESSION: 1. No evidence of acute traumatic injury. 2. Mild tricompartmental degenerative arthritis. Electronically signed by: Dorethia Molt MD 05/29/2024 09:53 PM EDT RP Workstation: [id number]   DG Hip Unilat W or  Wo Pelvis 2-3 Views Right Result Date: 05/29/2024 EXAM: 2 or more VIEW(S) XRAY OF THE unilateral hip 05/29/2024 09:34:00 PM COMPARISON: None available. CLINICAL HISTORY: fall. fall FINDINGS: BONES AND JOINTS: Probable screw fixation of the bilateral femoral epiphysis. 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 Mild asymmetric right hip degenerative arthritis. SOFT TISSUES: Tubal ligation clips noted. IMPRESSION: 1. 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 2. Mild asymmetric right hip degenerative arthritis. Electronically signed by: Dorethia Molt MD 05/29/2024 09:52 PM EDT RP Workstation: [id number]   DG Chest 2 View Result Date: 05/29/2024 EXAM: 2 VIEW(S) XRAY OF THE CHEST 05/29/2024 09:34:00 PM COMPARISON: 05/15/2024 CLINICAL HISTORY: fall. fall FINDINGS: LUNGS AND PLEURA: No focal pulmonary opacity. No pulmonary edema. No pleural effusion. No pneumothorax. HEART AND MEDIASTINUM: Cardiomegaly. No acute abnormality of the cardiac and mediastinal silhouettes. BONES AND SOFT TISSUES: No acute osseous abnormality. IMPRESSION: 1. No acute process. 2. Cardiomegaly. Electronically signed by: Dorethia Molt MD 05/29/2024 09:49 PM EDT RP Workstation: [id number]    Procedures Procedures (including critical care time)  Medications Ordered in UC Medications  acetaminophen  (TYLENOL ) tablet 975 mg (975 mg Oral Given 05/29/24 1736)    Initial Impression / Assessment and Plan / UC Course  I have reviewed the triage vital signs and the nursing notes.  Pertinent labs & imaging results that were available during my care of the patient were reviewed by me and considered in my medical decision making (see chart for details).   Patient is well-appearing, afebrile, not tachycardic, not tachypneic, oxygenating well on room air.  Patient is hypertensive in urgent care today.  1. Fall, initial encounter 2. Right hip  pain 3. Acute right ankle pain 4. Right foot pain No red flags Given extent of pain and xray images desired by patient, I recommended further evaluation and management in the ER Patient is in agreement to plan and is safe to transport via private vehicle at this time  The patient was given the opportunity to ask questions.  All questions answered to their satisfaction.  The patient is in agreement to this plan.   Final Clinical Impressions(s) / UC Diagnoses   Final diagnoses:  Fall, initial encounter  Right hip pain  Acute right ankle pain  Right foot pain     Discharge Instructions      Please go directly to the ER for further evaluation and management of your pain    ED Prescriptions   None    PDMP not reviewed this encounter.   Chandra Harlene LABOR, NP 05/30/24 1029

## 2024-05-29 NOTE — ED Provider Notes (Signed)
 [place] EMERG[facility]Y DEPARTMENT AT [facility] Provider Note   CSN: [id number] Arrival date & time: 05/29/24  8195     Patient presents with: Ann Woods is a 48 y.o. female.   Pt is a 47 yo female with pmhx significant for allergies, obesity, htn, and hld.  Pt said she fell last night and she has right hip and right ankle pain.  Pt has been able to ambulate, but it is very difficult.       Prior to Admission medications   Medication Sig Start Date End Date Taking? Authorizing Provider  ibuprofen  (ADVIL ) 800 MG tablet Take 1 tablet (800 mg total) by mouth 3 (three) times daily. 05/29/24  Yes Dean Clarity, MD  oxyCODONE -acetaminophen  (PERCOCET/ROXICET) 5-325 MG tablet Take 1 tablet by mouth every 6 (six) hours as needed for severe pain (pain score 7-10). 05/29/24  Yes Dean Clarity, MD  acetaminophen  (TYLENOL ) 500 MG tablet Take 1,000 mg by mouth every 6 (six) hours as needed.    [provider]  albuterol  (VENTOLIN  HFA) 108 (90 Base) MCG/ACT inhaler Inhale 2 puffs into the lungs every 6 (six) hours as needed for wheezing or shortness of breath. 07/31/20   Henson, Vickie L, NP-C  amLODipine -benazepril  (LOTREL) 5-10 MG capsule Take 1 capsule by mouth daily. 09/01/19   Whitmire, Dawn, FNP  azelastine  (ASTELIN ) 0.1 % nasal spray Use 1 spray in each nostril twice daily 01/21/19   Jeneal Danita Macintosh, MD  benzonatate  (TESSALON  PERLES) 100 MG capsule Take 1 capsule (100 mg total) by mouth 3 (three) times daily as needed for cough. Patient not taking: Reported on 05/15/2024 09/09/22   Christia Budds, MD  dextromethorphan  15 MG/5ML syrup Take 10 mLs by mouth 4 (four) times daily as needed for cough. Patient not taking: Reported on 05/15/2024    [provider]  fluticasone  (FLONASE ) 50 MCG/ACT nasal spray Place 2 sprays into both nostrils daily. 02/08/21   Tonette Lauraine HERO, PA-C  HYDROcodone  bit-homatropine (HYCODAN) 5-1.5 MG/5ML syrup Take 5 mLs  by mouth every 8 (eight) hours as needed for cough. Patient not taking: Reported on 05/15/2024 09/10/23   Early, Sara E, NP  ketoconazole (NIZORAL) 2 % cream Apply 1 Application topically as needed. Patient not taking: Reported on 05/15/2024 07/23/22   [provider]  ondansetron  (ZOFRAN ) 8 MG tablet Take 1 tablet (8 mg total) by mouth every 8 (eight) hours as needed for nausea or vomiting. Patient not taking: Reported on 05/15/2024 09/09/22   Christia Budds, MD  triamcinolone  cream (KENALOG ) 0.1 % Apply 1 Application topically as needed. Patient not taking: Reported on 05/15/2024 07/23/22   [provider]  [name] (CYANOCOBALAMIN) 500 MCG tablet Take 500 mcg by mouth daily.    [provider]  zinc gluconate 50 MG tablet Take 50 mg by mouth daily.    [provider]    Allergies: Patient has no known allergies.    Review of Systems  Musculoskeletal:        Right hip/ankle pain  All other systems reviewed and are negative.   Updated Vital Signs BP (!) 141/94   Pulse 77   Temp 97.8 F (36.6 C) (Oral)   Resp 16   Ht 5' 6 (1.676 m)   Wt (!) 181 kg   LMP 02/15/2021   SpO2 92%   BMI 64.41 kg/m   Physical Exam Vitals and nursing note reviewed.  Constitutional:      Appearance: Normal appearance.  HENT:     Head: Normocephalic and atraumatic.     Right Ear: External ear normal.     Left Ear: External ear normal.     Nose: Nose normal.     Mouth/Throat:     Mouth: Mucous membranes are moist.     Pharynx: Oropharynx is clear.  Eyes:     Extraocular Movements: Extraocular movements intact.     Conjunctiva/sclera: Conjunctivae normal.     Pupils: Pupils are equal, round, and reactive to light.  Cardiovascular:     Rate and Rhythm: Normal rate and regular rhythm.     Pulses: Normal pulses.     Heart sounds: Normal heart sounds.  Pulmonary:     Effort: Pulmonary effort is normal.     Breath sounds: Normal breath sounds.  Abdominal:      General: Abdomen is flat. Bowel sounds are normal.     Palpations: Abdomen is soft.  Musculoskeletal:     Cervical back: Normal range of motion and neck supple.     Comments: Tenderness to palpation right hip/ankle  Skin:    General: Skin is warm.     Capillary Refill: Capillary refill takes less than 2 seconds.  Neurological:     General: No focal deficit present.     Mental Status: She is alert and oriented to person, place, and time.  Psychiatric:        Mood and Affect: Mood normal.        Behavior: Behavior normal.     (all labs ordered are listed, but only abnormal results are displayed) Labs Reviewed - No data to display  EKG: None  Radiology: DG Ankle Complete Right Result Date: 05/29/2024 EXAM: 3 or more VIEW(S) XRAY OF THE  ANKLE 05/29/2024 09:34:00 PM CLINICAL HISTORY: fall. fall COMPARISON: None available. FINDINGS: BONES AND JOINTS: Plantar calcaneal spur. SOFT TISSUES: Diffuse subcutaneous soft tissue edema. IMPRESSION: 1. No acute osseous abnormality. 2. Plantar calcaneal spur. 3. Diffuse subcutaneous soft tissue edema. Electronically signed by: Dorethia Molt MD 05/29/2024 09:54 PM EDT RP Workstation: [id number]   DG Knee Complete 4 Views Right Result Date: 05/29/2024 EXAM: 4 or more VIEW(S) XRAY OF THE  KNEE COMPARISON: None available. CLINICAL HISTORY: fall. fall FINDINGS: BONES AND JOINTS: Tricompartmental osteophyte formation with mild lateral compartment joint space narrowing consistent with degenerative changes. SOFT TISSUES: The soft tissues are unremarkable. IMPRESSION: 1. No evidence of acute traumatic injury. 2. Mild tricompartmental degenerative arthritis. Electronically signed by: Dorethia Molt MD 05/29/2024 09:53 PM EDT RP Workstation: [id number]   DG Hip Unilat W or Wo Pelvis 2-3 Views Right Result Date: 05/29/2024 EXAM: 2 or more VIEW(S) XRAY OF THE unilateral hip 05/29/2024 09:34:00 PM COMPARISON: None available. CLINICAL HISTORY: fall. fall FINDINGS:  BONES AND JOINTS: Probable screw fixation of the bilateral femoral epiphysis. 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 Mild asymmetric right hip degenerative arthritis. SOFT TISSUES: Tubal ligation clips noted. IMPRESSION: 1. 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 2. Mild asymmetric right hip degenerative arthritis. Electronically signed by: Dorethia Molt MD 05/29/2024 09:52 PM EDT RP Workstation: [id number]   DG Chest 2 View Result Date: 05/29/2024 EXAM: 2 VIEW(S) XRAY OF THE CHEST 05/29/2024 09:34:00 PM COMPARISON: 05/15/2024 CLINICAL HISTORY: fall. fall FINDINGS: LUNGS AND PLEURA: No focal pulmonary opacity. No pulmonary edema. No pleural effusion. No pneumothorax. HEART AND MEDIASTINUM: Cardiomegaly. No acute abnormality of the cardiac and mediastinal silhouettes. BONES AND SOFT TISSUES:  No acute osseous abnormality. IMPRESSION: 1. No acute process. 2. Cardiomegaly. Electronically signed by: Dorethia Molt MD 05/29/2024 09:49 PM EDT RP Workstation: [id number]     Procedures   Medications Ordered in the ED  morphine (PF) 4 MG/ML injection 4 mg (4 mg Intramuscular Given 05/29/24 2137)  ondansetron  (ZOFRAN -ODT) disintegrating tablet 4 mg (4 mg Oral Given 05/29/24 2138)                                    Medical Decision Making Amount and/or Complexity of Data Reviewed Radiology: ordered.  Risk Prescription drug management.   This patient presents to the ED for concern of fall, this involves an extensive number of treatment options, and is a complaint that carries with it a high risk of complications and morbidity.  The differential diagnosis includes multiple trauma   Co morbidities that complicate the patient evaluation  allergies, obesity, htn, and hld   Additional history obtained:  Additional history obtained from epic chart review External records from outside source  obtained and reviewed including family  Imaging Studies ordered:  I ordered imaging studies including r ankle, r knee, r hip, cxr  I independently visualized and interpreted imaging which showed  R ankle: No acute osseous abnormality.  2. Plantar calcaneal spur.  3. Diffuse subcutaneous soft tissue edema.  R knee: . No evidence of acute traumatic injury.  2. Mild tricompartmental degenerative arthritis.  R hip: Chronic deformity of femoral head likely related to remote slipped capital  femoral epiphysis.  2. Mild asymmetric right hip degenerative arthritis.  CXR: No acute process.  2. Cardiomegaly.   I agree with the radiologist interpretation   Medicines ordered and prescription drug management:  I ordered medication including morphine/zofran   for sx  Reevaluation of the patient after these medicines showed that the patient improved I have reviewed the patients home medicines and have made adjustments as needed   Problem List / ED Course:  Fall:  no fx; pt is stable for d/c.  Return if worse.  F/u with pcp.   Reevaluation:  After the interventions noted above, I reevaluated the patient and found that they have :improved   Social Determinants of Health:  Lives at home   Dispostion:  After consideration of the diagnostic results and the patients response to treatment, I feel that the patent would benefit from discharge with outpatient.       Final diagnoses:  Fall, initial encounter  Sprain of right ankle, unspecified ligament, initial encounter  Strain of right hip, initial encounter    ED Discharge Orders          Ordered    oxyCODONE -acetaminophen  (PERCOCET/ROXICET) 5-325 MG tablet  Every 6 hours PRN        05/29/24 2219    ibuprofen  (ADVIL ) 800 MG tablet  3 times daily        05/29/24 2219    For home use only DME Walker        05/29/24 2220    For home use only DME Cane        05/29/24 2220               Dean Clarity, MD 05/29/24  2336

## 2024-05-29 NOTE — ED Triage Notes (Signed)
 Pt to ED from UC after fall last night resulting in R hip and foot pain.

## 2024-05-29 NOTE — ED Triage Notes (Signed)
 Pt states she was dogsitting last night when she tripped over the dog and carpet, hitting chin on wall and falling forward onto the ground with twisted legs. Denies any LOC.  C/O chin soreness, tenderness to sternum, right hip, right lower leg and ankle pain radiating down to right foot. C/o difficulty ambulating and bearing weight with RLE.  Has taken Tyl (last dose last night).

## 2024-05-29 NOTE — ED Notes (Signed)
 Patient is being discharged from the Urgent Care and sent to the Emergency Department via private vehicle . Per DOROTHA Code, NP, patient is in need of higher level of care due to fall, difficulty ambulating, significant hip pain. Patient is aware and verbalizes understanding of plan of care.  Vitals:   05/29/24 1656  BP: (!) 173/113  Pulse: 91  Resp: 20  Temp: 99.5 F (37.5 C)  SpO2: 97%

## 2024-05-29 NOTE — Discharge Instructions (Signed)
Please go directly to the ER for further evaluation and management of your pain

## 2024-05-29 NOTE — ED Notes (Signed)
 Patient transported to X-ray

## 2024-11-07 ENCOUNTER — Ambulatory Visit: Admitting: Family Medicine
